# Patient Record
Sex: Female | Born: 1955
Health system: Southern US, Community
[De-identification: ages and names within clinical notes are randomized; demographics above are authoritative.]

## PROBLEM LIST (undated history)

## (undated) DIAGNOSIS — D649 Anemia, unspecified: Secondary | ICD-10-CM

## (undated) DIAGNOSIS — E876 Hypokalemia: Secondary | ICD-10-CM

## (undated) DIAGNOSIS — Z87891 Personal history of nicotine dependence: Secondary | ICD-10-CM

## (undated) DIAGNOSIS — I319 Disease of pericardium, unspecified: Secondary | ICD-10-CM

## (undated) DIAGNOSIS — G35D Multiple sclerosis, unspecified: Secondary | ICD-10-CM

## (undated) DIAGNOSIS — G35 Multiple sclerosis: Secondary | ICD-10-CM

## (undated) DIAGNOSIS — I4892 Unspecified atrial flutter: Secondary | ICD-10-CM

## (undated) DIAGNOSIS — E8770 Fluid overload, unspecified: Secondary | ICD-10-CM

## (undated) DIAGNOSIS — R569 Unspecified convulsions: Secondary | ICD-10-CM

## (undated) HISTORY — DX: Fluid overload, unspecified: E87.70

## (undated) HISTORY — DX: Anemia, unspecified: D64.9

## (undated) HISTORY — DX: Unspecified atrial flutter: I48.92

## (undated) HISTORY — DX: Disease of pericardium, unspecified: I31.9

## (undated) HISTORY — DX: Unspecified convulsions: R56.9

## (undated) HISTORY — PX: HYSTEROTOMY: SHX1776

## (undated) HISTORY — DX: Hypokalemia: E87.6

## (undated) HISTORY — DX: Multiple sclerosis, unspecified: G35.D

## (undated) HISTORY — PX: ABDOMINAL HYSTERECTOMY: SHX81

## (undated) HISTORY — DX: Personal history of nicotine dependence: Z87.891

## (undated) HISTORY — DX: Multiple sclerosis: G35

## (undated) HISTORY — PX: BREAST EXCISIONAL BIOPSY: SUR124

---

## 1997-06-05 ENCOUNTER — Emergency Department (HOSPITAL_COMMUNITY): Admission: EM | Admit: 1997-06-05 | Discharge: 1997-06-05 | Payer: Self-pay | Admitting: Emergency Medicine

## 1998-10-13 ENCOUNTER — Encounter: Payer: Self-pay | Admitting: Emergency Medicine

## 1998-10-13 ENCOUNTER — Emergency Department (HOSPITAL_COMMUNITY): Admission: EM | Admit: 1998-10-13 | Discharge: 1998-10-13 | Payer: Self-pay | Admitting: Emergency Medicine

## 1998-10-22 ENCOUNTER — Encounter: Admission: RE | Admit: 1998-10-22 | Discharge: 1999-01-20 | Payer: Self-pay

## 1999-02-01 ENCOUNTER — Encounter: Admission: RE | Admit: 1999-02-01 | Discharge: 1999-02-23 | Payer: Self-pay | Admitting: Internal Medicine

## 2000-05-25 ENCOUNTER — Ambulatory Visit (HOSPITAL_COMMUNITY): Admission: RE | Admit: 2000-05-25 | Discharge: 2000-05-25 | Payer: Self-pay | Admitting: Internal Medicine

## 2000-05-25 ENCOUNTER — Encounter: Payer: Self-pay | Admitting: Internal Medicine

## 2001-02-26 ENCOUNTER — Ambulatory Visit (HOSPITAL_COMMUNITY): Admission: RE | Admit: 2001-02-26 | Discharge: 2001-02-26 | Payer: Self-pay | Admitting: Internal Medicine

## 2001-02-26 ENCOUNTER — Encounter: Payer: Self-pay | Admitting: Internal Medicine

## 2001-02-27 ENCOUNTER — Encounter: Payer: Self-pay | Admitting: Cardiology

## 2001-03-11 ENCOUNTER — Ambulatory Visit (HOSPITAL_COMMUNITY): Admission: RE | Admit: 2001-03-11 | Discharge: 2001-03-11 | Payer: Self-pay | Admitting: Cardiology

## 2001-12-07 ENCOUNTER — Emergency Department (HOSPITAL_COMMUNITY): Admission: EM | Admit: 2001-12-07 | Discharge: 2001-12-07 | Payer: Self-pay | Admitting: Emergency Medicine

## 2001-12-07 ENCOUNTER — Encounter: Payer: Self-pay | Admitting: Emergency Medicine

## 2002-03-26 ENCOUNTER — Ambulatory Visit (HOSPITAL_COMMUNITY): Admission: RE | Admit: 2002-03-26 | Discharge: 2002-03-26 | Payer: Self-pay | Admitting: Neurology

## 2002-03-26 ENCOUNTER — Encounter: Payer: Self-pay | Admitting: Neurology

## 2002-10-29 ENCOUNTER — Encounter: Payer: Self-pay | Admitting: Internal Medicine

## 2002-10-29 ENCOUNTER — Ambulatory Visit (HOSPITAL_COMMUNITY): Admission: RE | Admit: 2002-10-29 | Discharge: 2002-10-29 | Payer: Self-pay | Admitting: Internal Medicine

## 2003-01-14 ENCOUNTER — Ambulatory Visit (HOSPITAL_COMMUNITY): Admission: RE | Admit: 2003-01-14 | Discharge: 2003-01-14 | Payer: Self-pay | Admitting: Internal Medicine

## 2003-11-01 ENCOUNTER — Emergency Department (HOSPITAL_COMMUNITY): Admission: EM | Admit: 2003-11-01 | Discharge: 2003-11-01 | Payer: Self-pay | Admitting: Family Medicine

## 2003-12-07 ENCOUNTER — Ambulatory Visit (HOSPITAL_COMMUNITY): Admission: RE | Admit: 2003-12-07 | Discharge: 2003-12-07 | Payer: Self-pay | Admitting: Internal Medicine

## 2004-03-01 ENCOUNTER — Encounter: Admission: RE | Admit: 2004-03-01 | Discharge: 2004-03-01 | Payer: Self-pay | Admitting: Internal Medicine

## 2004-03-01 ENCOUNTER — Encounter (INDEPENDENT_AMBULATORY_CARE_PROVIDER_SITE_OTHER): Payer: Self-pay | Admitting: *Deleted

## 2004-03-17 ENCOUNTER — Ambulatory Visit (HOSPITAL_COMMUNITY): Admission: RE | Admit: 2004-03-17 | Discharge: 2004-03-17 | Payer: Self-pay | Admitting: Internal Medicine

## 2004-12-29 ENCOUNTER — Ambulatory Visit (HOSPITAL_COMMUNITY): Admission: RE | Admit: 2004-12-29 | Discharge: 2004-12-29 | Payer: Self-pay | Admitting: *Deleted

## 2005-01-26 ENCOUNTER — Encounter (INDEPENDENT_AMBULATORY_CARE_PROVIDER_SITE_OTHER): Payer: Self-pay | Admitting: *Deleted

## 2005-01-26 ENCOUNTER — Observation Stay (HOSPITAL_COMMUNITY): Admission: AD | Admit: 2005-01-26 | Discharge: 2005-01-27 | Payer: Self-pay | Admitting: Pediatrics

## 2005-05-31 ENCOUNTER — Encounter: Admission: RE | Admit: 2005-05-31 | Discharge: 2005-05-31 | Payer: Self-pay | Admitting: Internal Medicine

## 2006-01-03 ENCOUNTER — Encounter: Admission: RE | Admit: 2006-01-03 | Discharge: 2006-01-03 | Payer: Self-pay | Admitting: Internal Medicine

## 2006-02-12 ENCOUNTER — Ambulatory Visit (HOSPITAL_COMMUNITY): Admission: RE | Admit: 2006-02-12 | Discharge: 2006-02-12 | Payer: Self-pay | Admitting: Surgery

## 2006-02-20 ENCOUNTER — Ambulatory Visit (HOSPITAL_COMMUNITY): Admission: RE | Admit: 2006-02-20 | Discharge: 2006-02-20 | Payer: Self-pay | Admitting: Surgery

## 2006-02-20 ENCOUNTER — Encounter (INDEPENDENT_AMBULATORY_CARE_PROVIDER_SITE_OTHER): Payer: Self-pay | Admitting: Specialist

## 2006-03-14 ENCOUNTER — Ambulatory Visit: Payer: Self-pay | Admitting: Infectious Diseases

## 2006-03-14 DIAGNOSIS — G35 Multiple sclerosis: Secondary | ICD-10-CM | POA: Insufficient documentation

## 2006-03-14 DIAGNOSIS — R599 Enlarged lymph nodes, unspecified: Secondary | ICD-10-CM | POA: Insufficient documentation

## 2006-03-14 DIAGNOSIS — J329 Chronic sinusitis, unspecified: Secondary | ICD-10-CM | POA: Insufficient documentation

## 2006-03-14 DIAGNOSIS — G40209 Localization-related (focal) (partial) symptomatic epilepsy and epileptic syndromes with complex partial seizures, not intractable, without status epilepticus: Secondary | ICD-10-CM | POA: Insufficient documentation

## 2006-03-14 DIAGNOSIS — Z9079 Acquired absence of other genital organ(s): Secondary | ICD-10-CM | POA: Insufficient documentation

## 2006-03-26 ENCOUNTER — Encounter: Payer: Self-pay | Admitting: Infectious Diseases

## 2006-03-26 ENCOUNTER — Ambulatory Visit: Payer: Self-pay | Admitting: Infectious Diseases

## 2006-03-26 LAB — CONVERTED CEMR LAB
ALT: 31 units/L (ref 0–35)
AST: 23 units/L (ref 0–37)
Albumin: 4.6 g/dL (ref 3.5–5.2)
Alkaline Phosphatase: 63 units/L (ref 39–117)
Amylase: 96 units/L (ref 0–105)
BUN: 15 mg/dL (ref 6–23)
Basophils Absolute: 0 10*3/uL (ref 0.0–0.1)
Basophils Relative: 0 % (ref 0–1)
CMV IgM: <0.9 (ref <0.90)
CO2: 29 meq/L (ref 19–32)
Calcium: 10 mg/dL (ref 8.4–10.5)
Chloride: 102 meq/L (ref 96–112)
Creatinine, Ser: 0.83 mg/dL (ref 0.40–1.20)
Crypto Ag: NEGATIVE
Cytomegalovirus Ab-IgG: POSITIVE — AB
EBV NA IgG: 4.62 — ABNORMAL HIGH
EBV VCA IgG: 5.21 — ABNORMAL HIGH
EBV VCA IgM: 0.25
Eosinophils Absolute: 0 10*3/uL (ref 0.0–0.7)
Eosinophils Relative: 1 % (ref 0–5)
Glucose, Bld: 79 mg/dL (ref 70–99)
HCT: 42.6 % (ref 36.0–46.0)
Hemoglobin: 13.5 g/dL (ref 12.0–15.0)
LDH: 150 units/L (ref 94–250)
Lipase: <10 units/L (ref 0–75)
Lymphocytes Relative: 31 % (ref 12–46)
Lymphs Abs: 1.4 10*3/uL (ref 0.7–3.3)
MCHC: 31.7 g/dL (ref 30.0–36.0)
MCV: 85.2 fL (ref 78.0–100.0)
Monocytes Absolute: 0.3 10*3/uL (ref 0.2–0.7)
Monocytes Relative: 6 % (ref 3–11)
Neutro Abs: 2.8 10*3/uL (ref 1.7–7.7)
Neutrophils Relative %: 62 % (ref 43–77)
Platelets: 168 10*3/uL (ref 150–400)
Potassium: 4.3 meq/L (ref 3.5–5.3)
RBC: 5 M/uL (ref 3.87–5.11)
RDW: 14.2 % — ABNORMAL HIGH (ref 11.5–14.0)
Sodium: 140 meq/L (ref 135–145)
Total Bilirubin: 0.4 mg/dL (ref 0.3–1.2)
Total Protein: 7.5 g/dL (ref 6.0–8.3)
Toxoplasma Antibody- IgM: 0.37
Toxoplasma IgG Ratio: <0.5
WBC: 4.5 10*3/uL (ref 4.0–10.5)

## 2006-03-28 ENCOUNTER — Ambulatory Visit (HOSPITAL_COMMUNITY): Admission: RE | Admit: 2006-03-28 | Discharge: 2006-03-28 | Payer: Self-pay | Admitting: Infectious Diseases

## 2006-04-27 ENCOUNTER — Encounter: Admission: RE | Admit: 2006-04-27 | Discharge: 2006-04-27 | Payer: Self-pay | Admitting: Internal Medicine

## 2006-06-13 ENCOUNTER — Encounter: Admission: RE | Admit: 2006-06-13 | Discharge: 2006-06-13 | Payer: Self-pay | Admitting: Internal Medicine

## 2006-06-18 ENCOUNTER — Telehealth: Payer: Self-pay | Admitting: Infectious Diseases

## 2007-11-26 ENCOUNTER — Encounter: Admission: RE | Admit: 2007-11-26 | Discharge: 2007-11-26 | Payer: Self-pay | Admitting: Internal Medicine

## 2009-02-04 ENCOUNTER — Encounter: Admission: RE | Admit: 2009-02-04 | Discharge: 2009-02-04 | Payer: Self-pay | Admitting: Internal Medicine

## 2010-02-19 ENCOUNTER — Encounter: Payer: Self-pay | Admitting: Internal Medicine

## 2010-02-20 ENCOUNTER — Encounter: Payer: Self-pay | Admitting: Internal Medicine

## 2010-03-01 NOTE — Letter (Signed)
Summary: Initial OV  Initial OV   Imported By: Dorice Lamas 05/18/2006 11:47:51  _____________________________________________________________________  External Attachment:    Type:   Image     Comment:   External Document

## 2010-03-01 NOTE — Progress Notes (Signed)
Summary: Zithromax refill 06-18-06/mld  Phone Note Refill Request Message from:  Fax from Pharmacy  Refills Requested: Medication #1:  ZITHROMAX 600 MG TABS Take 1 tablet by mouth once a day.   Dosage confirmed as above?Dosage Confirmed   Last Refilled: 06/10/2006 Patient missed appt. in March 2008.  Approved antibiotice one time with no additional refills and a note to pharmacy that patient needs an office visit.  Initial call taken by: Paulo Fruit,  Jun 18, 2006 9:41 AM    Prescriptions: ZITHROMAX 600 MG TABS (AZITHROMYCIN) Take 1 tablet by mouth once a day  #30 x 0   Entered by:   Paulo Fruit   Authorized by:   Johny Sax MD   Signed by:   Paulo Fruit on 06/18/2006   Method used:   Telephoned to ...       [facility] [address] [facility]       [address]       [place], [place]  [address]  [place]       Ph: [phone]       Fax: [phone]   RxID:   [id number]  ...................................................................Paulo Fruit  Jun 18, 2006 9:42 AM

## 2010-03-01 NOTE — Assessment & Plan Note (Signed)
Summary: Office Visit (Infectious Disease)    Vital Signs:  Patient Profile:   55 Years Old Female Weight:      106.3 pounds (48.32 kg) Temp:     97.8 degrees F oral Pulse rate:   78 / minute BP supine:   124 / 78  (right arm)  Pt. in pain?   yes    Location:   abdomen    Intensity:   6    Type:       sharp  Vitals Entered By: Tomasita Morrow RN (March 26, 2006 11:32 AM)              Is Patient Diabetic? No Nutritional Status Normal  Have you ever been in a relationship where you felt threatened, hurt or afraid?No   Does patient need assistance? Functional Status Self care Ambulation Normal               Financial    Marital Info: Married                                  DATE  POS  [place]  [place]?  Syphilis:  Gonorrhea:     Chlamydia:  Genital Herpes:        Smoker: never DATE  POS  Hep A:   Vaccines / Physicals  Pregnancy Information        Opportunistic Infections    YEAR   PPD Status        Treatment Information                 Last Education Dates           Current HIV Regimen  Referrals   Chief Complaint:  f/u ov.  History of Present Illness: 55 yo F with MS who was seen previously for Lymphadenopathy. Currently complains of LLQ pain. She describes it as a catch. Has tried OTCs without improvement.  Has had no difficulty taking the azithro that she was stated on at her previous visit (03-14-06). She has continued LAN in her axilla. Has had no fevers or chills.  States she has a recent Mammogram which was negative. (01-03-06 multiple enlarged lymphnodes) Her medications are unchanged.   Prior Medications: BETASERON 0.3 MG SOLR (INTERFERON BETA-1B)  DILANTIN 100 MG CAPS (PHENYTOIN SODIUM EXTENDED) Take 2  tablets by mouth at bedtime ZITHROMAX 600 MG TABS (AZITHROMYCIN) Take 1 tablet by mouth once a day Current Allergies: ! * COPAXONE ! TEGRETOL     Physical Exam  General:     alert and underweight appearing.    Eyes:     pupils equal, pupils round, and pupils reactive to light.   Mouth:     good dentition and pharynx pink and moist.   Neck:     no masses.   Lungs:     normal respiratory effort and normal breath sounds.   Heart:     normal rate, regular rhythm, and no murmur.   Abdomen:     soft, normal bowel sounds, no distention, no masses, no guarding, no rigidity, no rebound tenderness, and LUQ tenderness (mild).  Additional Exam:     palpable lymph node in R axilla.      Impression & Recommendations:  Problem # 1:  LYMPHADENOPATHY (ICD-785.6)  Her updated medication list for this problem includes:    Zithromax 600 Mg Tabs (Azithromycin) .Marland Kitchen... Take 1 tablet by mouth once a day  will continue her azithro check the following blood tests- crypto ag, histo ag, EBV panel, CMV IgG/M, HIV, RPR, BCx, BCx AFB,  will also check CT abdomen with contrast to further evaluate the pain she has. depending on the result will see if there are nodes which are amenable to bx.

## 2010-06-17 NOTE — Op Note (Signed)
NAME:  Sherri Ramirez, Sherri Ramirez           ACCOUNT NO.:  [id number]   MEDICAL RECORD NO.:  [id number]          PATIENT TYPE:  INP   LOCATION:  9320                          FACILITY:  [facility]   PHYSICIAN:   B. Earlene Plater, M.D.  DATE OF BIRTH:  1956-01-16   DATE OF PROCEDURE:  01/26/2005  DATE OF DISCHARGE:                                 OPERATIVE REPORT   PREOPERATIVE DIAGNOSIS:  Abnormal and fibroids.   POSTOPERATIVE DIAGNOSIS:  Abnormal and fibroids.   OPERATION/PROCEDURE:  1.  Laparoscopic-assisted vaginal hysterectomy.  2.  Bilateral salpingo-oophorectomy.   SURGEON:  Chester Holstein. Earlene Plater, M.D.   ASSISTANT:  Marlinda Mike, C.N.M.  Lenoard Aden, M.D.   ANESTHESIA:  General.   SPECIMENS:  Uterus, tubes and ovaries and cervix.   ESTIMATED BLOOD LOSS:  300 mL.   COMPLICATIONS:  None.   INDICATIONS:  The patient has history of heavy menstrual periods, not  responding to medical management.  Preoperative endometrial biopsy was  benign.  Ultrasound consistent with intramural fibroids.  The patient was  advised of the risks surgically, including infection, bleeding, damage to  surrounding organs, and potential need to convert to abdominal hysterectomy.   DESCRIPTION OF PROCEDURE:  The patient was taken to the operating room and  general anesthesia obtained.  She was placed in the [place] stirrups and  prepped and draped in the standard fashion.  Foley catheter was inserted to  the bladder.  Sponge stick placed in the vagina.  '   A 10 mm incision was placed in the umbilicus, carried sharply to the fascia.  The fascia was divided and elevated.  Posterior sheath and peritoneum were  entered sharply.  Pursestring suture of 0 Vicryl was placed around the  fascial defect.  Hasson cannula inserted and secured.  Pneumoperitoneum with  CO2 gas.  An 11 mm port was placed on the left lower quadrant for  laparoscopic visualization and 5 mm ports were placed in the midline  suprapubically and  right lower quadrant each under direct laparoscopic  visualization.   The uterus was enlarged, about 14 weeks size with multiple fibroids.  The  course of each ureter was identified and found to be well away from the area  of interest.  The left infundibulopelvic ligament was not well-visualized  initially due to the bulk of the fibroid.  Therefore, the left round  ligament was first approached, sealed and divided with the Harmonic scalpel  and utero-ovarian ligament and tube divided with the Harmonic as well.  Bladder flap created with the Harmonic.  On the right side the  infundibulopelvic ligament was more accessible.  Therefore, it was then  divided with the Harmonic and the tube and ovary removed with the specimen.  Uterine artery was skeletonized on each side, sealed and divided with  Harmonic.  Uterus was very bulky and I did not think it would work well to  [employer] vaginally.  Therefore, I chose to proceed with the [facility]  laparoscopically.  This was done in the standard fashion after the cervix  was truncated with the Harmonic. All the debris was removed and then the  left tube and ovary were removed after sealing and dividing the  infundibulopelvic ligament.  Hemostasis was obtained.   Colpotomy made anteriorly and posteriorly sharply.  Curved Deaver placed in  the anterior cul-de-sac. Uterosacral ligaments were clamped with curved  Heaney clamps, divided and suture ligated with 0 Vicryl with hemostasis  obtained.  A second bite on each side was necessary for the lower portion of  the cardinal ligaments.  These were clamped, cut and suture ligated with 0  Vicryl.  Hemostasis was obtained.   The posterior culdoplasty suture placed of 0 Vicryl entering the posterior  cul-de-sac, taking a bite of the left uterosacral reefing along the  posterior peritoneum, taking a bite of the right uterosacral ligament and  exiting posteriorly.  The remainder of the vaginal cuff was  closed with  figure-of-eight suture of 0 Vicryl.   Attention was returned to the abdomen.  After gloves were changed,  pneumoperitoneum was reobtained.  All pedicles were inspected and were  hemostatic.  There was some slight oozing from the dome of the bladder, made  hemostatic with light bipolar cautery.  The pneumoperitoneum was taken down  to a pressure of about 4 mm. No bleeding noted.  Therefore, procedure  terminated.   The ports were removed and sites inspected laparoscopically and they were  hemostatic.  Scope was removed and gas released.  Hasson cannula removed.  Abdominal wall elevated.  Pursestring suture tied down.  The fascial defect  was obliterated.  No contents herniated prior to closure.  Left lower  quadrant port was also closed with a single stitch of 0 Vicryl with careful  attention to the underlying structures.  Skin was closed at the umbilicus  with subcuticular 4-0 Vicryl.  Skin was closed at the lower ports with  Dermabond.   The patient tolerated the procedure well.  There were no complications.  She  was taken to the recovery room awake, alert, in stable condition. All counts  were correct per the operating room staff.      Gerri Spore B. Earlene Plater, M.D.  Electronically Signed     WBD/MEDQ  D:  01/26/2005  T:  01/27/2005  Job:  [id number]

## 2011-11-24 ENCOUNTER — Other Ambulatory Visit: Payer: Self-pay | Admitting: Internal Medicine

## 2011-11-24 DIAGNOSIS — Z1231 Encounter for screening mammogram for malignant neoplasm of breast: Secondary | ICD-10-CM

## 2012-01-03 ENCOUNTER — Ambulatory Visit
Admission: RE | Admit: 2012-01-03 | Discharge: 2012-01-03 | Disposition: A | Discharge status: Home / Self Care | Payer: BC Managed Care – PPO | Source: Ambulatory Visit | Attending: Internal Medicine | Admitting: Internal Medicine

## 2012-01-03 DIAGNOSIS — Z1231 Encounter for screening mammogram for malignant neoplasm of breast: Secondary | ICD-10-CM

## 2012-06-19 ENCOUNTER — Telehealth: Payer: Self-pay | Admitting: *Deleted

## 2012-06-19 NOTE — Telephone Encounter (Signed)
Patient calling wanting to know if her Handicap form is done. Its been a week and have not heard anything.

## 2012-06-19 NOTE — Telephone Encounter (Signed)
Sherri Ramirez said that she called patient and left message that form was ready for pick up front desk today...06/19/12.Marland KitchenMarland KitchenMarland Kitchenas

## 2012-06-27 ENCOUNTER — Telehealth: Payer: Self-pay | Admitting: *Deleted

## 2012-06-27 NOTE — Telephone Encounter (Signed)
Patient had spoke to the Rebif nurse and was told that she needs to have labs drawn every 3 months. The patient wants to know if she is to make a follow-up appointment or just ave an order put in for lab work. Please advise.

## 2012-06-28 NOTE — Telephone Encounter (Signed)
I talked to Sherri Ramirez who says that she tells patients  it should be done in about 6 months so looks like its about time in the next few weeks,  then can be done yearly with f/u visit.

## 2012-07-17 ENCOUNTER — Other Ambulatory Visit: Payer: Self-pay | Admitting: Neurology

## 2012-07-17 ENCOUNTER — Telehealth: Payer: Self-pay

## 2012-07-17 DIAGNOSIS — G35 Multiple sclerosis: Secondary | ICD-10-CM

## 2012-07-17 NOTE — Telephone Encounter (Signed)
I reviewed recommendations for follow up blood work with Sherri Redhead, NP. I advised patient that an order has been placed for her to come get her blood work done whenever she is available. Patient appreciated the call and wished that I let [name] know she is appreciated.

## 2012-07-17 NOTE — Telephone Encounter (Signed)
Message copied by [facility] on Wed Jul 17, 2012  3:39 PM ------      Message from: Anice Paganini      Created: Tue Jul 16, 2012 11:28 AM      Contact: Pt Sherri Ramirez       Pt called states she is suppose to have lab work done more since her meds' have been changed and she states no one is returning her call about this matter and she doesn't want to wait till Aug. She wants someone to call her back.  ------

## 2012-07-19 ENCOUNTER — Other Ambulatory Visit: Payer: Self-pay | Admitting: Nurse Practitioner

## 2012-07-20 LAB — COMPREHENSIVE METABOLIC PANEL
ALT: 14 IU/L (ref 0–32)
AST: 16 IU/L (ref 0–40)
Albumin/Globulin Ratio: 1.7 (ref 1.1–2.5)
Albumin: 4.5 g/dL (ref 3.5–5.5)
Alkaline Phosphatase: 48 IU/L (ref 39–117)
BUN/Creatinine Ratio: 15 (ref 9–23)
BUN: 12 mg/dL (ref 6–24)
CO2: 26 mmol/L (ref 18–29)
Calcium: 9.3 mg/dL (ref 8.7–10.2)
Chloride: 101 mmol/L (ref 97–108)
Creatinine, Ser: 0.82 mg/dL (ref 0.57–1.00)
GFR calc Af Amer: 93 mL/min/{1.73_m2} (ref >59)
GFR calc non Af Amer: 81 mL/min/{1.73_m2} (ref >59)
Globulin, Total: 2.7 g/dL (ref 1.5–4.5)
Glucose: 93 mg/dL (ref 65–99)
Potassium: 3.9 mmol/L (ref 3.5–5.2)
Sodium: 141 mmol/L (ref 134–144)
Total Bilirubin: 0.3 mg/dL (ref 0.0–1.2)
Total Protein: 7.2 g/dL (ref 6.0–8.5)

## 2012-07-22 ENCOUNTER — Telehealth: Payer: Self-pay

## 2012-07-22 NOTE — Telephone Encounter (Signed)
Message copied by [facility] on Mon Jul 22, 2012  1:32 PM ------      Message from: Beverely Low      Created: Mon Jul 22, 2012  8:50 AM       Labs look good. Please call patient            ----- Message -----         From: [facility] Lab Results In Interface         Sent: 07/20/2012   5:47 AM           To: Nilda Riggs, NP                   ------

## 2012-07-22 NOTE — Telephone Encounter (Signed)
I called patient and advised her of laboratory results, per [name] NP

## 2012-08-05 ENCOUNTER — Other Ambulatory Visit: Payer: Self-pay | Admitting: Neurology

## 2012-08-08 ENCOUNTER — Other Ambulatory Visit: Payer: Self-pay | Admitting: Neurology

## 2012-09-06 ENCOUNTER — Encounter: Payer: Self-pay | Admitting: Nurse Practitioner

## 2012-09-06 ENCOUNTER — Ambulatory Visit (INDEPENDENT_AMBULATORY_CARE_PROVIDER_SITE_OTHER): Payer: BC Managed Care – PPO | Admitting: Nurse Practitioner

## 2012-09-06 VITALS — BP 128/80 | HR 83 | Ht 64.0 in | Wt 111.0 lb

## 2012-09-06 DIAGNOSIS — Z8669 Personal history of other diseases of the nervous system and sense organs: Secondary | ICD-10-CM

## 2012-09-06 DIAGNOSIS — G35 Multiple sclerosis: Secondary | ICD-10-CM

## 2012-09-06 DIAGNOSIS — G40209 Localization-related (focal) (partial) symptomatic epilepsy and epileptic syndromes with complex partial seizures, not intractable, without status epilepticus: Secondary | ICD-10-CM

## 2012-09-06 MED ORDER — PHENYTOIN SODIUM EXTENDED 100 MG PO CAPS: 200.0000 mg | ORAL_CAPSULE | Freq: Every evening | ORAL | Status: DC

## 2012-09-06 MED ORDER — PHENYTOIN 50 MG PO CHEW: 50.0000 mg | CHEWABLE_TABLET | Freq: Every evening | ORAL | Status: DC

## 2012-09-06 MED ORDER — INTERFERON BETA-1A 44 MCG/0.5ML ~~LOC~~ SOLN: 44.0000 ug | SUBCUTANEOUS | Status: DC

## 2012-09-06 NOTE — Progress Notes (Signed)
  Reason for visit followup for her seizure disorder and multiple sclerosis  HPI: Sherri Ramirez 57 year old female returns for followup. She was last seen in this office 03/21/2011. She has a history of multiple sclerosis and is currently on Rebif  without side effects or site injection problems She has had a history of seizure disorder, well controlled on Dilantin last seizure 1999. Multiple sclerosis was diagnosed in March 2004. She was initiated on Copaxone and then had an extreme allergic reaction and was initiated on Betaseron in April of 2007. Insurance will no longer cover Betaseron and she was initiated on Rebif in February of this year.  TODAY: Denies double vision, no falls, no sensory changes, no spasticity.Denies loss of bowel or bladder control,  no speech difficulties. She has been on disability from her job since January 2009. No seizure activity.  ROS:  Negative   Medications Current Outpatient Prescriptions on File Prior to Visit  Medication Sig Dispense Refill  . alendronate (FOSAMAX) 70 MG tablet Take 70 mg by mouth every 7 (seven) days. Take with a full glass of water on an empty stomach.      Marland Kitchen FA-Cyanocobalamin-B6-D-Ca (D 1000 PLUS) TABS Take by mouth daily.      . interferon beta-1a (REBIF REBIDOSE) 44 MCG/0.5ML injection Inject 44 mcg into the skin 3 (three) times a week.      . Multiple Vitamins-Minerals (MULTIVITAMIN PO) Take by mouth daily.      . phenytoin (DILANTIN) 100 MG ER capsule Take 2 capsules (200 mg total) by mouth every evening.  180 capsule  0  . phenytoin (DILANTIN) 50 MG tablet Chew 1 tablet (50 mg total) by mouth every evening.  90 tablet  0   No current facility-administered medications on file prior to visit.    Allergies  Allergies  Allergen Reactions  . Carbamazepine   . Glatiramer Acetate     REACTION: anaphylaxis    Physical Exam General: well developed, well nourished, seated, in no evident distress Head: head normocephalic and atraumatic.  Oropharynx benign Neck: supple with no carotid  bruits Cardiovascular: regular rate and rhythm, no murmurs  Neurologic Exam Mental Status: Awake and fully alert. Oriented to place and time. Follows all commands. Speech and language normal.   Cranial Nerves:  Pupils equal, briskly reactive to light. Extraocular movements full without nystagmus. Visual fields full to confrontation. Hearing intact and symmetric to finger snap. Facial sensation intact. Face, tongue, palate move normally and symmetrically. Neck flexion and extension normal.  Motor: Normal bulk and tone. Normal strength in all tested extremity muscles except 4/5 weakness in left lower extremity Sensory.: intact to touch and pinprick and vibratory.  Coordination: Rapid alternating movements normal in all extremities. Finger-to-nose and heel-to-shin performed accurately bilaterally. Gait and Station: Arises from chair without difficulty. Stance is normal. Gait demonstrates normal stride length and balance . Able to heel, toe and tandem walk without difficulty. No assistive device Reflexes: 1+ and symmetric. Toes downgoing.     ASSESSMENT: Multiple sclerosis relapsing remitting in good control on Rebif Complex partial seizure disorder in good control on Dilantin     PLAN: Will  continue Dilantin will renew Will renew Rebif Will obtain CBC, CMP and Dilantin level today F/U yearly   Nilda Riggs, GNP-BC APRN

## 2012-09-06 NOTE — Patient Instructions (Addendum)
Pt to continue Dilantin will renew Will renew Rebif Will obtain CBC, CMP and Dilantin level today F/U yearly

## 2012-09-06 NOTE — Progress Notes (Signed)
I have read the note, and I agree with the clinical assessment and plan.  

## 2012-09-09 NOTE — Telephone Encounter (Signed)
Done

## 2012-09-10 LAB — COMPREHENSIVE METABOLIC PANEL
ALT: 12 IU/L (ref 0–32)
AST: 16 IU/L (ref 0–40)
Albumin/Globulin Ratio: 1.8 (ref 1.1–2.5)
Albumin: 4.5 g/dL (ref 3.5–5.5)
Alkaline Phosphatase: 56 IU/L (ref 39–117)
BUN/Creatinine Ratio: 22 (ref 9–23)
BUN: 17 mg/dL (ref 6–24)
CO2: 22 mmol/L (ref 18–29)
Calcium: 9.5 mg/dL (ref 8.7–10.2)
Chloride: 102 mmol/L (ref 97–108)
Creatinine, Ser: 0.79 mg/dL (ref 0.57–1.00)
GFR calc Af Amer: 97 mL/min/{1.73_m2} (ref >59)
GFR calc non Af Amer: 85 mL/min/{1.73_m2} (ref >59)
Globulin, Total: 2.5 g/dL (ref 1.5–4.5)
Glucose: 79 mg/dL (ref 65–99)
Potassium: 4.1 mmol/L (ref 3.5–5.2)
Sodium: 141 mmol/L (ref 134–144)
Total Bilirubin: 0.2 mg/dL (ref 0.0–1.2)
Total Protein: 7 g/dL (ref 6.0–8.5)

## 2012-09-10 LAB — CBC WITH DIFFERENTIAL/PLATELET
Basophils Absolute: 0 10*3/uL (ref 0.0–0.2)
Basos: 0 % (ref 0–3)
Eos: 0 % (ref 0–5)
Eosinophils Absolute: 0 10*3/uL (ref 0.0–0.4)
HCT: 39.7 % (ref 34.0–46.6)
Hemoglobin: 13.3 g/dL (ref 11.1–15.9)
Immature Grans (Abs): 0 10*3/uL (ref 0.0–0.1)
Immature Granulocytes: 0 % (ref 0–2)
Lymphocytes Absolute: 2.3 10*3/uL (ref 0.7–3.1)
Lymphs: 43 % (ref 14–46)
MCH: 27.1 pg (ref 26.6–33.0)
MCHC: 33.5 g/dL (ref 31.5–35.7)
MCV: 81 fL (ref 79–97)
Monocytes Absolute: 0.4 10*3/uL (ref 0.1–0.9)
Monocytes: 8 % (ref 4–12)
Neutrophils Absolute: 2.6 10*3/uL (ref 1.4–7.0)
Neutrophils Relative %: 49 % (ref 40–74)
RBC: 4.91 x10E6/uL (ref 3.77–5.28)
RDW: 14.5 % (ref 12.3–15.4)
WBC: 5.3 10*3/uL (ref 3.4–10.8)

## 2012-09-10 LAB — PHENYTOIN LEVEL, TOTAL: Phenytoin Lvl: 12.5 ug/mL (ref 10.0–20.0)

## 2012-09-10 NOTE — Progress Notes (Signed)
Quick Note:  Spoke to patient and relayed normal lab results, per [place]. ______

## 2012-09-18 ENCOUNTER — Ambulatory Visit: Payer: Self-pay | Admitting: Nurse Practitioner

## 2012-12-13 ENCOUNTER — Other Ambulatory Visit: Payer: Self-pay

## 2012-12-13 DIAGNOSIS — Z1231 Encounter for screening mammogram for malignant neoplasm of breast: Secondary | ICD-10-CM

## 2013-01-16 ENCOUNTER — Ambulatory Visit
Admission: RE | Admit: 2013-01-16 | Discharge: 2013-01-16 | Disposition: A | Discharge status: Home / Self Care | Payer: BC Managed Care – PPO | Source: Ambulatory Visit

## 2013-01-16 DIAGNOSIS — Z1231 Encounter for screening mammogram for malignant neoplasm of breast: Secondary | ICD-10-CM

## 2013-02-05 ENCOUNTER — Emergency Department (HOSPITAL_COMMUNITY): Payer: BC Managed Care – PPO

## 2013-02-05 ENCOUNTER — Encounter (HOSPITAL_COMMUNITY): Payer: Self-pay | Admitting: Emergency Medicine

## 2013-02-05 ENCOUNTER — Emergency Department (HOSPITAL_COMMUNITY)
Admission: EM | Admit: 2013-02-05 | Discharge: 2013-02-05 | Disposition: A | Discharge status: Home / Self Care | Payer: BC Managed Care – PPO | Attending: Emergency Medicine | Admitting: Emergency Medicine

## 2013-02-05 DIAGNOSIS — Y939 Activity, unspecified: Secondary | ICD-10-CM | POA: Insufficient documentation

## 2013-02-05 DIAGNOSIS — M25571 Pain in right ankle and joints of right foot: Secondary | ICD-10-CM

## 2013-02-05 DIAGNOSIS — X500XXA Overexertion from strenuous movement or load, initial encounter: Secondary | ICD-10-CM | POA: Insufficient documentation

## 2013-02-05 DIAGNOSIS — S99919A Unspecified injury of unspecified ankle, initial encounter: Principal | ICD-10-CM

## 2013-02-05 DIAGNOSIS — S8990XA Unspecified injury of unspecified lower leg, initial encounter: Secondary | ICD-10-CM | POA: Insufficient documentation

## 2013-02-05 DIAGNOSIS — Z79899 Other long term (current) drug therapy: Secondary | ICD-10-CM | POA: Insufficient documentation

## 2013-02-05 DIAGNOSIS — S99929A Unspecified injury of unspecified foot, initial encounter: Principal | ICD-10-CM

## 2013-02-05 DIAGNOSIS — Z87891 Personal history of nicotine dependence: Secondary | ICD-10-CM | POA: Insufficient documentation

## 2013-02-05 DIAGNOSIS — G40909 Epilepsy, unspecified, not intractable, without status epilepticus: Secondary | ICD-10-CM | POA: Insufficient documentation

## 2013-02-05 DIAGNOSIS — R296 Repeated falls: Secondary | ICD-10-CM | POA: Insufficient documentation

## 2013-02-05 DIAGNOSIS — Y929 Unspecified place or not applicable: Secondary | ICD-10-CM | POA: Insufficient documentation

## 2013-02-05 MED ORDER — HYDROCODONE-ACETAMINOPHEN 5-325 MG PO TABS: 1.0000 | ORAL_TABLET | Freq: Four times a day (QID) | ORAL | Status: DC | PRN

## 2013-02-05 NOTE — ED Provider Notes (Signed)
CSN: [id number]     Arrival date & time 02/05/13  1216 History   First MD Initiated Contact with Patient 02/05/13 1308     Chief Complaint  Patient presents with  . Ankle Pain   (Consider location/radiation/quality/duration/timing/severity/associated sxs/prior Treatment) HPI Comments: Patient presents to the emergency department with chief complaint of right ankle pain. She states that she slipped on a plastic bag on Monday night. She states that she rolled her ankle. She's had pain while walking, and with movement of the ankle since the incident. She has not taken anything to alleviate her symptoms. Nothing makes his symptoms better or worse. She is able to ambulate, but has to limp.    The history is provided by the patient. No language interpreter was used.    Past Medical History  Diagnosis Date  . Seizure   . MS (multiple sclerosis)    Past Surgical History  Procedure Laterality Date  . Hysterotomy    . Abdominal hysterectomy     Family History  Problem Relation Age of Onset  . Heart attack Father   . Colon cancer Mother    History  Substance Use Topics  . Smoking status: Former Games developer  . Smokeless tobacco: Never Used  . Alcohol Use: No   OB History   Grav Para Term Preterm Abortions TAB SAB Ect Mult Living                 Review of Systems  All other systems reviewed and are negative.    Allergies  Carbamazepine and Glatiramer acetate  Home Medications   Current Outpatient Rx  Name  Route  Sig  Dispense  Refill  . alendronate (FOSAMAX) 70 MG tablet   Oral   Take 70 mg by mouth every 7 (seven) days. Take with a full glass of water on an empty stomach.         Marland Kitchen FA-Cyanocobalamin-B6-D-Ca (D 1000 PLUS) TABS   Oral   Take by mouth daily.         Marland Kitchen HYDROcodone-acetaminophen (NORCO/VICODIN) 5-325 MG per tablet   Oral   Take 1-2 tablets by mouth every 6 (six) hours as needed.   13 tablet   0   . interferon beta-1a (REBIF REBIDOSE) 44 MCG/0.5ML  injection   Subcutaneous   Inject 0.5 mLs (44 mcg total) into the skin 3 (three) times a week.   18 mL   3   . Multiple Vitamins-Minerals (MULTIVITAMIN PO)   Oral   Take by mouth daily.         . phenytoin (DILANTIN) 100 MG ER capsule   Oral   Take 2 capsules (200 mg total) by mouth every evening.   180 capsule   3   . phenytoin (DILANTIN) 50 MG tablet   Oral   Chew 1 tablet (50 mg total) by mouth every evening.   90 tablet   3    BP 121/81  Pulse 92  Temp(Src) 98.2 F (36.8 C) (Oral)  Resp 16  Wt 103 lb 14.4 oz (47.129 kg)  SpO2 100% Physical Exam  Nursing note and vitals reviewed. Constitutional: She is oriented to person, place, and time. She appears well-developed and well-nourished.  HENT:  Head: Normocephalic and atraumatic.  Eyes: Conjunctivae and EOM are normal.  Neck: Normal range of motion.  Cardiovascular: Normal rate.   Pulmonary/Chest: Effort normal.  Abdominal: She exhibits no distension.  Musculoskeletal: Normal range of motion.  Right ankle tender to palpation over the  anterior talofibular and calcaneal fibular ligament, and no bony abnormality or deformity, range of motion and strength is reduced secondary to pain, special testing is deferred secondary to guarding and pain.  Neurological: She is alert and oriented to person, place, and time.  Skin: Skin is dry.  Psychiatric: She has a normal mood and affect. Her behavior is normal. Judgment and thought content normal.    ED Course  Procedures (including critical care time) Labs Review Labs Reviewed - No data to display Imaging Review Dg Ankle Complete Right  02/05/2013   CLINICAL DATA:  Lateral ankle pain  EXAM: RIGHT ANKLE - COMPLETE 3+ VIEW  COMPARISON:  None  FINDINGS: Osseous demineralization.  Ankle mortise intact.  No acute fracture, dislocation or bone destruction.  IMPRESSION: No acute osseous abnormalities.   Electronically Signed   By: Ulyses SouthwardMark  Boles M.D.   On: 02/05/2013 13:27    EKG  Interpretation   None       MDM   1. Ankle pain, right    Patient with right ankle pain. She likely sustained sprain. Offered crutches and an ankle brace, but she requested to have a boot because she doesn't feel that she could use crutches.  I will give her a boot, and have her follow-up with orthopedics.  She is stable and ready for discharge.    Roxy Horsemanobert Red Mandt, PA-C 02/05/13 1441

## 2013-02-05 NOTE — ED Notes (Signed)
Pt states Monday evening hurt right ankle by slipping on plastic bag

## 2013-02-05 NOTE — ED Notes (Signed)
Ortho tech to apply cam walker.

## 2013-02-05 NOTE — Discharge Instructions (Signed)

## 2013-02-06 NOTE — ED Provider Notes (Signed)
Medical screening examination/treatment/procedure(s) were performed by non-physician practitioner and as supervising physician I was immediately available for consultation/collaboration.  EKG Interpretation   None         Candyce ChurnJohn David Jonh Mcqueary, MD 02/06/13 [phone]

## 2013-09-05 ENCOUNTER — Ambulatory Visit (INDEPENDENT_AMBULATORY_CARE_PROVIDER_SITE_OTHER): Payer: BC Managed Care – PPO | Admitting: Nurse Practitioner

## 2013-09-05 ENCOUNTER — Encounter: Payer: Self-pay | Admitting: Nurse Practitioner

## 2013-09-05 VITALS — BP 128/81 | HR 67 | Ht 64.0 in | Wt 104.0 lb

## 2013-09-05 DIAGNOSIS — Z5181 Encounter for therapeutic drug level monitoring: Secondary | ICD-10-CM

## 2013-09-05 DIAGNOSIS — Z8669 Personal history of other diseases of the nervous system and sense organs: Secondary | ICD-10-CM

## 2013-09-05 DIAGNOSIS — G35 Multiple sclerosis: Secondary | ICD-10-CM

## 2013-09-05 MED ORDER — INTERFERON BETA-1A 44 MCG/0.5ML ~~LOC~~ SOLN: 44.0000 ug | SUBCUTANEOUS | Status: DC

## 2013-09-05 MED ORDER — PHENYTOIN SODIUM EXTENDED 100 MG PO CAPS: 200.0000 mg | ORAL_CAPSULE | Freq: Every evening | ORAL | Status: DC

## 2013-09-05 MED ORDER — PHENYTOIN 50 MG PO CHEW: 50.0000 mg | CHEWABLE_TABLET | Freq: Every evening | ORAL | Status: DC

## 2013-09-05 NOTE — Progress Notes (Signed)
[facility]  PATIENT: Sherri Ramirez DOB: Jun 23, 1955   REASON FOR VISIT: follow up for seizure disorder and MS   HISTORY OF PRESENT ILLNESS:Sherri Ramirez 58 year old female returns for followup. She was last seen in this office 09/06/12.  She has a history of multiple sclerosis and is currently on Rebif without side effects or site injection problems She has had a history of seizure disorder, well controlled on Dilantin last seizure 1999. Multiple sclerosis was diagnosed in March 2004. She was initiated on Copaxone and then had an extreme allergic reaction and was initiated on Betaseron in April of 2007. Insurance will no longer cover Betaseron and she was initiated on Rebif in February of this year. She denies injection site problems. Denies double vision, no falls, no sensory changes, no spasticity.Denies loss of bowel or bladder control, no speech difficulties. She has been on disability from her job since January 2009. No seizure activity.She returns for reevaluation.  REVIEW OF SYSTEMS: Full 14 system review of systems performed and notable only for those listed, all others are neg:  Constitutional: N/A  Cardiovascular: N/A  Ear/Nose/Throat: N/A  Skin: N/A  Eyes: N/A  Respiratory: N/A  Gastroitestinal: N/A  Hematology/Lymphatic: N/A  Endocrine: N/A Musculoskeletal:N/A  Allergy/Immunology: N/A  Neurological: N/A Psychiatric: N/A Sleep : NA   ALLERGIES: Allergies  Allergen Reactions  . Carbamazepine   . Glatiramer Acetate     REACTION: anaphylaxis    HOME MEDICATIONS: Outpatient Prescriptions Prior to Visit  Medication Sig Dispense Refill  . alendronate (FOSAMAX) 70 MG tablet Take 70 mg by mouth every 7 (seven) days. Take with a full glass of water on an empty stomach.      Marland Kitchen FA-Cyanocobalamin-B6-D-Ca (D 1000 PLUS) TABS Take by mouth daily.      . interferon beta-1a (REBIF REBIDOSE) 44 MCG/0.5ML injection Inject 0.5 mLs (44 mcg total) into the skin 3 (three)  times a week.  18 mL  3  . Multiple Vitamins-Minerals (MULTIVITAMIN PO) Take by mouth daily.      . phenytoin (DILANTIN) 100 MG ER capsule Take 2 capsules (200 mg total) by mouth every evening.  180 capsule  3  . phenytoin (DILANTIN) 50 MG tablet Chew 1 tablet (50 mg total) by mouth every evening.  90 tablet  3  . HYDROcodone-acetaminophen (NORCO/VICODIN) 5-325 MG per tablet Take 1-2 tablets by mouth every 6 (six) hours as needed.  13 tablet  0   No facility-administered medications prior to visit.    PAST MEDICAL HISTORY: Past Medical History  Diagnosis Date  . Seizure   . MS (multiple sclerosis)     PAST SURGICAL HISTORY: Past Surgical History  Procedure Laterality Date  . Hysterotomy    . Abdominal hysterectomy      FAMILY HISTORY: Family History  Problem Relation Age of Onset  . Heart attack Father   . Colon cancer Mother     SOCIAL HISTORY: History   Social History  . Marital Status: Married    Spouse Name: [name]    Number of Children: 0  . Years of Education: N/A   Occupational History  . Not on file.   Social History Main Topics  . Smoking status: Former Games developer  . Smokeless tobacco: Never Used  . Alcohol Use: No  . Drug Use: No  . Sexual Activity: Not on file   Other Topics Concern  . Not on file   Social History Narrative   Patient is married and does not work.  Patient quit smoking in 2007.     PHYSICAL EXAM  Filed Vitals:   09/05/13 1323  BP: 128/81  Pulse: 67  Height: 5\' 4"  (1.626 m)  Weight: 104 lb (47.174 kg)   Body mass index is 17.84 kg/(m^2). General: well developed, well nourished, seated, in no evident distress  Head: head normocephalic and atraumatic. Oropharynx benign  Neck: supple with no carotid bruits   Neurologic Exam  Mental Status: Awake and fully alert. Oriented to place and time. Follows all commands. Speech and language normal.  Cranial Nerves: Pupils equal, briskly reactive to light. Extraocular movements full  without nystagmus. Visual fields full to confrontation. Hearing intact and symmetric to finger snap. Facial sensation intact. Face, tongue, palate move normally and symmetrically. Neck flexion and extension normal.  Motor: Normal bulk and tone. Normal strength in all tested extremity muscles except 4/5 weakness in left lower extremity  Sensory.: intact to touch and pinprick and vibratory.  Coordination: Rapid alternating movements normal in all extremities. Finger-to-nose and heel-to-shin performed accurately bilaterally.  Gait and Station: Arises from chair without difficulty. Stance is normal. Gait demonstrates normal stride length and balance . Able to heel, toe and mildly unsteady with tandem walk  No assistive device  Reflexes: 1+ and symmetric. Toes downgoing.   DIAGNOSTIC DATA (LABS, IMAGING, TESTING) - I reviewed patient records, labs, notes, testing and imaging myself where available.  Lab Results  Component Value Date   WBC 5.3 09/06/2012   HGB 13.3 09/06/2012   HCT 39.7 09/06/2012   MCV 81 09/06/2012   PLT 168 03/26/2006      Component Value Date/Time   NA 141 09/06/2012 0000   NA 140 03/26/2006 1947   K 4.1 09/06/2012 0000   CL 102 09/06/2012 0000   CO2 22 09/06/2012 0000   GLUCOSE 79 09/06/2012 0000   GLUCOSE 79 03/26/2006 1947   BUN 17 09/06/2012 0000   BUN 15 03/26/2006 1947   CREATININE 0.79 09/06/2012 0000   CALCIUM 9.5 09/06/2012 0000   PROT 7.0 09/06/2012 0000   PROT 7.5 03/26/2006 1947   ALBUMIN 4.6 03/26/2006 1947   AST 16 09/06/2012 0000   ALT 12 09/06/2012 0000   ALKPHOS 56 09/06/2012 0000   BILITOT 0.2 09/06/2012 0000   GFRNONAA 85 09/06/2012 0000   GFRAA 97 09/06/2012 0000    ASSESSMENT AND PLAN  58 y.o. year old female  has a past medical history of Seizure and MS (multiple sclerosis). here to followup. She is a patient of Dr. Anne HahnWillis who is out of the office.  Continue Dilantin at current dose will refill   Continue Rebif at current dose, will refill Will check labs  F/U yearly and  prn Nilda RiggsNancy Carolyn Harper Vandervoort, [facility]NP, [facility], APRN  [facility] [address], [address] [place], [place] [id number] [phone]

## 2013-09-05 NOTE — Patient Instructions (Signed)
Continue Dilantin at current dose.  Continue Rebif at current dose Will check labs  F/U yearly and prn

## 2013-09-06 LAB — CBC WITH DIFFERENTIAL/PLATELET
Basophils Absolute: 0 10*3/uL (ref 0.0–0.2)
Basos: 0 %
Eos: 0 %
Eosinophils Absolute: 0 10*3/uL (ref 0.0–0.4)
HCT: 36.1 % (ref 34.0–46.6)
Hemoglobin: 12.1 g/dL (ref 11.1–15.9)
Immature Grans (Abs): 0 10*3/uL (ref 0.0–0.1)
Immature Granulocytes: 0 %
Lymphocytes Absolute: 1.5 10*3/uL (ref 0.7–3.1)
Lymphs: 30 %
MCH: 26.9 pg (ref 26.6–33.0)
MCHC: 33.5 g/dL (ref 31.5–35.7)
MCV: 80 fL (ref 79–97)
Monocytes Absolute: 0.5 10*3/uL (ref 0.1–0.9)
Monocytes: 9 %
Neutrophils Absolute: 3.1 10*3/uL (ref 1.4–7.0)
Neutrophils Relative %: 61 %
RBC: 4.49 x10E6/uL (ref 3.77–5.28)
RDW: 14.3 % (ref 12.3–15.4)
WBC: 5.1 10*3/uL (ref 3.4–10.8)

## 2013-09-06 LAB — COMPREHENSIVE METABOLIC PANEL
ALT: 15 IU/L (ref 0–32)
AST: 14 IU/L (ref 0–40)
Albumin/Globulin Ratio: 1.7 (ref 1.1–2.5)
Albumin: 4.1 g/dL (ref 3.5–5.5)
Alkaline Phosphatase: 53 IU/L (ref 39–117)
BUN/Creatinine Ratio: 15 (ref 9–23)
BUN: 14 mg/dL (ref 6–24)
CO2: 25 mmol/L (ref 18–29)
Calcium: 9.1 mg/dL (ref 8.7–10.2)
Chloride: 103 mmol/L (ref 97–108)
Creatinine, Ser: 0.92 mg/dL (ref 0.57–1.00)
GFR calc Af Amer: 80 mL/min/{1.73_m2} (ref >59)
GFR calc non Af Amer: 70 mL/min/{1.73_m2} (ref >59)
Globulin, Total: 2.4 g/dL (ref 1.5–4.5)
Glucose: 82 mg/dL (ref 65–99)
Potassium: 3.8 mmol/L (ref 3.5–5.2)
Sodium: 143 mmol/L (ref 134–144)
Total Bilirubin: 0.2 mg/dL (ref 0.0–1.2)
Total Protein: 6.5 g/dL (ref 6.0–8.5)

## 2013-09-06 LAB — PHENYTOIN LEVEL, TOTAL: Phenytoin Lvl: 8.7 ug/mL — ABNORMAL LOW (ref 10.0–20.0)

## 2013-12-29 ENCOUNTER — Other Ambulatory Visit: Payer: Self-pay

## 2013-12-29 DIAGNOSIS — Z1231 Encounter for screening mammogram for malignant neoplasm of breast: Secondary | ICD-10-CM

## 2014-01-12 ENCOUNTER — Telehealth: Payer: Self-pay | Admitting: Nurse Practitioner

## 2014-01-12 NOTE — Telephone Encounter (Signed)
Patient stated [facility] requesting information stating Rx interferon beta-1a (REBIF REBIDOSE) 44 MCG/0.5ML injection need to be continued.  Case # [id number], phone # [phone].  Please call and advise.

## 2014-01-12 NOTE — Telephone Encounter (Signed)
I called [facility].  Provided clinical info they required.  Request is currently under review.  I called the patient back.  Got no answer.  Left message.

## 2014-01-19 ENCOUNTER — Ambulatory Visit: Payer: BC Managed Care – PPO

## 2014-01-26 ENCOUNTER — Ambulatory Visit
Admission: RE | Admit: 2014-01-26 | Discharge: 2014-01-26 | Disposition: A | Discharge status: Home / Self Care | Payer: BC Managed Care – PPO | Source: Ambulatory Visit

## 2014-01-26 DIAGNOSIS — Z1231 Encounter for screening mammogram for malignant neoplasm of breast: Secondary | ICD-10-CM

## 2014-08-02 ENCOUNTER — Other Ambulatory Visit: Payer: Self-pay | Admitting: Nurse Practitioner

## 2014-09-08 ENCOUNTER — Ambulatory Visit (INDEPENDENT_AMBULATORY_CARE_PROVIDER_SITE_OTHER): Payer: BLUE CROSS/BLUE SHIELD | Admitting: Nurse Practitioner

## 2014-09-08 ENCOUNTER — Encounter: Payer: Self-pay | Admitting: Nurse Practitioner

## 2014-09-08 VITALS — BP 122/62 | HR 77 | Ht 64.0 in | Wt 103.4 lb

## 2014-09-08 DIAGNOSIS — G35 Multiple sclerosis: Secondary | ICD-10-CM | POA: Diagnosis not present

## 2014-09-08 DIAGNOSIS — Z5181 Encounter for therapeutic drug level monitoring: Secondary | ICD-10-CM | POA: Diagnosis not present

## 2014-09-08 DIAGNOSIS — G40209 Localization-related (focal) (partial) symptomatic epilepsy and epileptic syndromes with complex partial seizures, not intractable, without status epilepticus: Secondary | ICD-10-CM

## 2014-09-08 MED ORDER — PHENYTOIN 50 MG PO CHEW: CHEWABLE_TABLET | ORAL | Status: DC

## 2014-09-08 MED ORDER — INTERFERON BETA-1A 44 MCG/0.5ML ~~LOC~~ SOSY
44.0000 ug | PREFILLED_SYRINGE | SUBCUTANEOUS | Status: DC
Start: 2014-09-08 — End: 2015-08-05

## 2014-09-08 MED ORDER — PHENYTOIN SODIUM EXTENDED 100 MG PO CAPS: ORAL_CAPSULE | ORAL | Status: DC

## 2014-09-08 NOTE — Progress Notes (Signed)
I have read the note, and I agree with the clinical assessment and plan.  Srihith Aquilino KEITH   

## 2014-09-08 NOTE — Progress Notes (Signed)
[facility]  PATIENT: Sherri Ramirez DOB: 02-14-1955   REASON FOR VISIT: Follow-up for relapsing remitting multiple sclerosis and seizure disorder  HISTORY FROM: Patient    HISTORY OF PRESENT ILLNESS:Sherri Ramirez 59 year old female returns for followup. She was last seen in this office 09/05/13. She has a history of multiple sclerosis and is currently on Rebif without side effects or site injection problems She has had a history of seizure disorder, well controlled on Dilantin last seizure 1999. Multiple sclerosis was diagnosed in March 2004. She was initiated on Copaxone and then had an extreme allergic reaction and was initiated on Betaseron in April of 2007. Insurance will no longer cover Betaseron and she was initiated on Rebif in February 2015. She denies injection site problems. Denies double vision, no falls, no sensory changes, no spasticity.Denies loss of bowel or bladder control, no speech difficulties. She has been on disability from her job since January 2009. Marland KitchenShe returns for reevaluation.    REVIEW OF SYSTEMS: Full 14 system review of systems performed and notable only for those listed, all others are neg:  Constitutional: neg  Cardiovascular: neg Ear/Nose/Throat: neg  Skin: neg Eyes: neg Respiratory: neg Gastroitestinal: neg  Hematology/Lymphatic: neg  Endocrine: neg Musculoskeletal:neg Allergy/Immunology: neg Neurological: neg Psychiatric: neg Sleep : neg   ALLERGIES: Allergies  Allergen Reactions  . Carbamazepine   . Glatiramer Acetate     REACTION: anaphylaxis    HOME MEDICATIONS: Outpatient Prescriptions Prior to Visit  Medication Sig Dispense Refill  . alendronate (FOSAMAX) 70 MG tablet Take 70 mg by mouth every 7 (seven) days. Take with a full glass of water on an empty stomach.    Marland Kitchen FA-Cyanocobalamin-B6-D-Ca (D 1000 PLUS) TABS Take by mouth daily.    . interferon beta-1a (REBIF REBIDOSE) 44 MCG/0.5ML injection Inject 0.5 mLs (44 mcg  total) into the skin 3 (three) times a week. 18 mL 3  . Multiple Vitamins-Minerals (MULTIVITAMIN PO) Take by mouth daily.    . phenytoin (DILANTIN) 100 MG ER capsule TAKE 2 CAPSULES (200 MG TOTAL) EVERY EVENING 180 capsule 0  . phenytoin (DILANTIN) 50 MG tablet CHEW 1 TABLET EVERY EVENING 90 tablet 0   No facility-administered medications prior to visit.    PAST MEDICAL HISTORY: Past Medical History  Diagnosis Date  . Seizure   . MS (multiple sclerosis)     PAST SURGICAL HISTORY: Past Surgical History  Procedure Laterality Date  . Hysterotomy    . Abdominal hysterectomy      FAMILY HISTORY: Family History  Problem Relation Age of Onset  . Heart attack Father   . Colon cancer Mother     SOCIAL HISTORY: History   Social History  . Marital Status: Married    Spouse Name: [name]  . Number of Children: 0  . Years of Education: N/A   Occupational History  . Not on file.   Social History Main Topics  . Smoking status: Former Games developer  . Smokeless tobacco: Never Used  . Alcohol Use: No  . Drug Use: No  . Sexual Activity: Not on file   Other Topics Concern  . Not on file   Social History Narrative   Patient is married and does not work.    Patient quit smoking in 2007.     PHYSICAL EXAM  Filed Vitals:   09/08/14 1414  BP: 112/69  Pulse: 77  Height:  (1.626 m)  Weight: 103 lb 6.4 oz (46.902 kg)   Body mass index is  17.74 kg/(m^2). General: well developed, well nourished, seated, in no evident distress  Head: head normocephalic and atraumatic. Oropharynx benign  Neck: supple with no carotid bruits   Neurologic Exam  Mental Status: Awake and fully alert. Oriented to place and time. Follows all commands. Speech and language normal.  Cranial Nerves: Pupils equal, briskly reactive to light. Extraocular movements full without nystagmus. Visual fields full to confrontation. Hearing intact and symmetric to finger snap. Facial sensation intact. Face,  tongue, palate move normally and symmetrically. Neck flexion and extension normal.  Motor: Normal bulk and tone. Normal strength in all tested extremity muscles except 4/5 weakness in left lower extremity  Sensory.: intact to touch and pinprick and vibratory.  Coordination: Rapid alternating movements normal in all extremities. Finger-to-nose and heel-to-shin performed accurately bilaterally.  Gait and Station: Arises from chair without difficulty. Stance is normal. Gait demonstrates normal stride length and balance . Able to heel, toe and mildly unsteady with tandem walk No assistive device  Reflexes: 1+ and symmetric. Toes downgoing.   DIAGNOSTIC DATA (LABS, IMAGING, TESTING) -  ASSESSMENT AND PLAN  59 y.o. year old female  has a past medical history of Seizure and MS (multiple sclerosis). here to follow-up. She is currently on Requip without exacerbation of symptoms. She is currently on Dilantin without seizure activity.  Continue Dilantin at current dose will refill  Continue Rebif at current dose, will refill Will check labs  F/U yearly and prn Nilda Riggs, [facility], [facility], APRN  [facility] [address], [address] [place], [place] [address] [phone]

## 2014-09-08 NOTE — Patient Instructions (Addendum)
Continue Dilantin at current dose will refill  Continue Rebif at current dose, will refill Will check labs  F/U yearly and prn  

## 2014-09-09 ENCOUNTER — Telehealth: Payer: Self-pay | Admitting: *Deleted

## 2014-09-09 LAB — COMPREHENSIVE METABOLIC PANEL
ALT: 14 IU/L (ref 0–32)
AST: 21 IU/L (ref 0–40)
Albumin/Globulin Ratio: 1.8 (ref 1.1–2.5)
Albumin: 4.7 g/dL (ref 3.5–5.5)
Alkaline Phosphatase: 71 IU/L (ref 39–117)
BUN/Creatinine Ratio: 17 (ref 9–23)
BUN: 17 mg/dL (ref 6–24)
Bilirubin Total: 0.2 mg/dL (ref 0.0–1.2)
CO2: 25 mmol/L (ref 18–29)
Calcium: 10.1 mg/dL (ref 8.7–10.2)
Chloride: 102 mmol/L (ref 97–108)
Creatinine, Ser: 0.98 mg/dL (ref 0.57–1.00)
GFR calc Af Amer: 74 mL/min/{1.73_m2} (ref >59)
GFR calc non Af Amer: 64 mL/min/{1.73_m2} (ref >59)
Globulin, Total: 2.6 g/dL (ref 1.5–4.5)
Glucose: 102 mg/dL — ABNORMAL HIGH (ref 65–99)
Potassium: 5.7 mmol/L — ABNORMAL HIGH (ref 3.5–5.2)
Sodium: 142 mmol/L (ref 134–144)
Total Protein: 7.3 g/dL (ref 6.0–8.5)

## 2014-09-09 LAB — CBC WITH DIFFERENTIAL/PLATELET
Basophils Absolute: 0 10*3/uL (ref 0.0–0.2)
Basos: 0 %
EOS (ABSOLUTE): 0 10*3/uL (ref 0.0–0.4)
Eos: 0 %
Hematocrit: 37.8 % (ref 34.0–46.6)
Hemoglobin: 12.4 g/dL (ref 11.1–15.9)
Immature Grans (Abs): 0 10*3/uL (ref 0.0–0.1)
Immature Granulocytes: 0 %
Lymphocytes Absolute: 1.6 10*3/uL (ref 0.7–3.1)
Lymphs: 29 %
MCH: 26.4 pg — ABNORMAL LOW (ref 26.6–33.0)
MCHC: 32.8 g/dL (ref 31.5–35.7)
MCV: 81 fL (ref 79–97)
Monocytes Absolute: 0.5 10*3/uL (ref 0.1–0.9)
Monocytes: 9 %
Neutrophils Absolute: 3.4 10*3/uL (ref 1.4–7.0)
Neutrophils: 62 %
Platelets: 151 10*3/uL (ref 150–379)
RBC: 4.69 x10E6/uL (ref 3.77–5.28)
RDW: 14.3 % (ref 12.3–15.4)
WBC: 5.6 10*3/uL (ref 3.4–10.8)

## 2014-09-09 LAB — PHENYTOIN LEVEL, TOTAL: Phenytoin (Dilantin), Serum: 6.6 ug/mL — ABNORMAL LOW (ref 10.0–20.0)

## 2014-09-09 NOTE — Telephone Encounter (Signed)
-----   Message from Nilda Riggs, NP sent at 09/09/2014  4:28 PM EDT ----- Please let patient know labs good, Dilantin level a little low but no seizures in  years

## 2014-09-09 NOTE — Progress Notes (Signed)
Quick Note:  I called pt and relayed the results of the labs to the pt. She verbalized understanding. ______

## 2014-09-09 NOTE — Telephone Encounter (Signed)
I called pt and relayed the lab results to her.   No change in her medications. Pt verbalized understanding.

## 2014-10-05 ENCOUNTER — Other Ambulatory Visit: Payer: Self-pay | Admitting: Nurse Practitioner

## 2015-04-22 ENCOUNTER — Telehealth: Payer: Self-pay

## 2015-04-22 NOTE — Telephone Encounter (Signed)
Fax from [employer].  Rebif has been approved for one year.

## 2015-04-27 NOTE — Telephone Encounter (Signed)
Rn call Vinny at [employer] at [phone]. Colbert Coyer stated the prescription signature form was not clear. Form was fax again.Rn explain [place] NP was on vacation this week. Colbert Coyer stated he understood and will refax form to have it filled out correctly.

## 2015-04-27 NOTE — Telephone Encounter (Signed)
Vinny/[facility] [employer] [phone] called regarding Rebif prescription. States patient's name was written over Doctor's name and vice versa. Will refax to [place].

## 2015-04-27 NOTE — Telephone Encounter (Signed)
Redid order

## 2015-05-03 ENCOUNTER — Other Ambulatory Visit: Payer: Self-pay

## 2015-05-03 DIAGNOSIS — Z1231 Encounter for screening mammogram for malignant neoplasm of breast: Secondary | ICD-10-CM

## 2015-05-11 NOTE — Telephone Encounter (Signed)
[facility] pharmacy called to see about a refill request for REBIF REBIDOSE 44 MCG/0.5ML SOAJ. Please [phone] anyone can help you

## 2015-05-12 NOTE — Telephone Encounter (Signed)
Clarified order with Renae Fickle.  Rebidose 72mcg/0.5ml  Sq 3 x weekly.  36 doses and 3 refills.

## 2015-05-19 ENCOUNTER — Ambulatory Visit
Admission: RE | Admit: 2015-05-19 | Discharge: 2015-05-19 | Disposition: A | Discharge status: Home / Self Care | Payer: Managed Care, Other (non HMO) | Source: Ambulatory Visit

## 2015-05-19 DIAGNOSIS — Z1231 Encounter for screening mammogram for malignant neoplasm of breast: Secondary | ICD-10-CM

## 2015-05-25 ENCOUNTER — Telehealth: Payer: Self-pay | Admitting: Nurse Practitioner

## 2015-05-25 NOTE — Telephone Encounter (Signed)
Patient is calling to get a 90 day supply Rx interferon beta-1a 44 mcg/0.5 ml sosy injection setn to [facility].  Thanks!

## 2015-05-25 NOTE — Telephone Encounter (Addendum)
I called and spoke to Mardi Mainland, pharmacist.  He stated did have order.  Per insurance needed to have 2 more months of 28 days then would be able to get 3 month supply.  It is [facility].

## 2015-05-26 NOTE — Telephone Encounter (Signed)
I called and let pt know below.  She stated she will call for refill.

## 2015-08-05 ENCOUNTER — Telehealth: Payer: Self-pay | Admitting: Nurse Practitioner

## 2015-08-05 MED ORDER — INTERFERON BETA-1A 44 MCG/0.5ML ~~LOC~~ SOAJ
44.0000 ug | SUBCUTANEOUS | Status: DC
Start: 2015-08-05 — End: 2015-09-21

## 2015-08-05 NOTE — Telephone Encounter (Signed)
Prescription escribed to [facility] for 90 day supply and 3 RF.  Pt has appt 09-21-15 at 1100.

## 2015-08-05 NOTE — Telephone Encounter (Signed)
Patient called to request refill (90 day supply) and renewal of REBIF REBIDOSE 44 MCG/0.5ML SOAJ, was advised by [facility] pharmacy, prescription has expired.

## 2015-09-08 ENCOUNTER — Ambulatory Visit: Payer: BLUE CROSS/BLUE SHIELD | Admitting: Nurse Practitioner

## 2015-09-21 ENCOUNTER — Encounter: Payer: Self-pay | Admitting: Nurse Practitioner

## 2015-09-21 ENCOUNTER — Ambulatory Visit (INDEPENDENT_AMBULATORY_CARE_PROVIDER_SITE_OTHER): Payer: Managed Care, Other (non HMO) | Admitting: Nurse Practitioner

## 2015-09-21 VITALS — BP 113/80 | HR 80 | Ht 64.0 in | Wt 102.8 lb

## 2015-09-21 DIAGNOSIS — G35 Multiple sclerosis: Secondary | ICD-10-CM

## 2015-09-21 DIAGNOSIS — Z5181 Encounter for therapeutic drug level monitoring: Secondary | ICD-10-CM

## 2015-09-21 DIAGNOSIS — G40209 Localization-related (focal) (partial) symptomatic epilepsy and epileptic syndromes with complex partial seizures, not intractable, without status epilepticus: Secondary | ICD-10-CM | POA: Diagnosis not present

## 2015-09-21 MED ORDER — INTERFERON BETA-1A 44 MCG/0.5ML ~~LOC~~ SOAJ: 44.0000 ug | SUBCUTANEOUS | 3 refills | Status: DC

## 2015-09-21 NOTE — Progress Notes (Signed)
I have read the note, and I agree with the clinical assessment and plan.  Raine Blodgett KEITH   

## 2015-09-21 NOTE — Patient Instructions (Signed)
Continue Dilantin at current dose will refill  Continue Rebif at current dose, will refill Will check labs  F/U yearly and prn

## 2015-09-21 NOTE — Progress Notes (Signed)
[facility]  PATIENT: Sherri StaplerLynn F Shugrue DOB: 09-07-1955   REASON FOR VISIT: Follow-up for relapsing remitting multiple sclerosis and seizure disorder  HISTORY FROM: Patient    HISTORY OF PRESENT ILLNESS:Ms. Sherri Ramirez 60 year old female returns for yearly followup.  She has a history of multiple sclerosis and is currently on Rebif without side effects or site injection problems She has had a history of seizure disorder, well controlled on Dilantin last seizure 1999. Multiple sclerosis was diagnosed in March 2004. She was initiated on Copaxone and then had an extreme allergic reaction and was initiated on Betaseron in April of 2007. Insurance will no longer cover Betaseron and she was initiated on Rebif in February 2015.  Denies double vision, no falls, no sensory changes, no spasticity.Denies loss of bowel or bladder control, no speech difficulties. She has been on disability from her job since January 2009. She returns for reevaluation.    REVIEW OF SYSTEMS: Full 14 system review of systems performed and notable only for those listed, all others are neg:  Constitutional: neg  Cardiovascular: neg Ear/Nose/Throat: neg  Skin: neg Eyes: neg Respiratory: neg Gastroitestinal: neg  Hematology/Lymphatic: neg  Endocrine: neg Musculoskeletal:neg Allergy/Immunology: neg Neurological: neg Psychiatric: neg Sleep : neg   ALLERGIES: Allergies  Allergen Reactions  . Carbamazepine   . Glatiramer Acetate     REACTION: anaphylaxis    HOME MEDICATIONS: Outpatient Medications Prior to Visit  Medication Sig Dispense Refill  . alendronate (FOSAMAX) 70 MG tablet Take 70 mg by mouth every 7 (seven) days. Take with a full glass of water on an empty stomach.    . cholecalciferol (VITAMIN D) 1000 UNITS tablet Take 2,000 Units by mouth daily.    . Interferon Beta-1a (REBIF REBIDOSE) 44 MCG/0.5ML SOAJ Inject 44 mcg into the skin 3 (three) times a week. 18 mL 3  . phenytoin (DILANTIN)  100 MG ER capsule TAKE 2 CAPSULES (200 MG TOTAL) EVERY EVENING 180 capsule 3  . phenytoin (DILANTIN) 50 MG tablet CHEW 1 TABLET EVERY EVENING 90 tablet 3  . FA-Cyanocobalamin-B6-D-Ca (D 1000 PLUS) TABS Take by mouth daily.    . Multiple Vitamins-Minerals (MULTIVITAMIN PO) Take by mouth daily.     No facility-administered medications prior to visit.     PAST MEDICAL HISTORY: Past Medical History:  Diagnosis Date  . MS (multiple sclerosis) (HCC)   . Seizure (HCC)     PAST SURGICAL HISTORY: Past Surgical History:  Procedure Laterality Date  . ABDOMINAL HYSTERECTOMY    . HYSTEROTOMY      FAMILY HISTORY: Family History  Problem Relation Age of Onset  . Colon cancer Mother   . Heart attack Father     SOCIAL HISTORY: Social History   Social History  . Marital status: Married    Spouse name: Sherri Ramirez  . Number of children: 0  . Years of education: N/A   Occupational History  . Not on file.   Social History Main Topics  . Smoking status: Former Games developermoker  . Smokeless tobacco: Never Used  . Alcohol use No  . Drug use: No  . Sexual activity: Not on file   Other Topics Concern  . Not on file   Social History Narrative   Patient is married and does not work.    Patient quit smoking in 2007.     PHYSICAL EXAM  Vitals:   09/21/15 1108  BP: 113/80  Pulse: 80  Weight: 102 lb 12.8 oz (46.6 kg)  Height: 5\' 4"  (1.626 m)  Body mass index is 17.65 kg/m. General: well developed, well nourished, seated, in no evident distress  Head: head normocephalic and atraumatic. Oropharynx benign  Neck: supple with no carotid bruits   Neurologic Exam  Mental Status: Awake and fully alert. Oriented to place and time. Follows all commands. Speech and language normal.  Cranial Nerves: Pupils equal, briskly reactive to light. Extraocular movements full without nystagmus. Visual fields full to confrontation. Hearing intact and symmetric to finger snap. Facial sensation intact. Face,  tongue, palate move normally and symmetrically. Neck flexion and extension normal.  Motor: Normal bulk and tone. Normal strength in all tested extremity muscles .  Sensory.: intact to touch and pinprick and vibratory in the upper and lower extremities.  Coordination: Rapid alternating movements normal in all extremities. Finger-to-nose and heel-to-shin performed accurately bilaterally.  Gait and Station: Arises from chair without difficulty. Stance is normal. Gait demonstrates normal stride length and balance . Able to heel, toe and mildly unsteady with tandem walk No assistive device  Reflexes: 1+ and symmetric. Toes downgoing.   DIAGNOSTIC DATA (LABS, IMAGING, TESTING) -  ASSESSMENT AND PLAN  59 y.o. year old female  has a past medical history of MS (multiple sclerosis) (HCC) and Seizure (HCC). here to follow-up. She is currently on Rebif without exacerbation of symptoms. She is currently on Dilantin without seizure activity.  Continue Dilantin at current dose will refillwhen level is back  Continue Rebif at current dose, will refill Will check labs CBC, CMP and Dilantin level F/U yearly and prn Nilda Riggs, [facility], [facility], APRN  [facility] [address] [place], [place] [address] [phone]

## 2015-09-22 ENCOUNTER — Telehealth: Payer: Self-pay | Admitting: *Deleted

## 2015-09-22 ENCOUNTER — Other Ambulatory Visit: Payer: Self-pay | Admitting: Nurse Practitioner

## 2015-09-22 LAB — CBC WITH DIFFERENTIAL/PLATELET
Basophils Absolute: 0 10*3/uL (ref 0.0–0.2)
Basos: 0 %
EOS (ABSOLUTE): 0 10*3/uL (ref 0.0–0.4)
Eos: 0 %
Hematocrit: 37.3 % (ref 34.0–46.6)
Hemoglobin: 12 g/dL (ref 11.1–15.9)
Immature Grans (Abs): 0 10*3/uL (ref 0.0–0.1)
Immature Granulocytes: 1 %
Lymphocytes Absolute: 1.5 10*3/uL (ref 0.7–3.1)
Lymphs: 28 %
MCH: 26.6 pg (ref 26.6–33.0)
MCHC: 32.2 g/dL (ref 31.5–35.7)
MCV: 83 fL (ref 79–97)
Monocytes Absolute: 0.4 10*3/uL (ref 0.1–0.9)
Monocytes: 8 %
Neutrophils Absolute: 3.4 10*3/uL (ref 1.4–7.0)
Neutrophils: 63 %
Platelets: 174 10*3/uL (ref 150–379)
RBC: 4.51 x10E6/uL (ref 3.77–5.28)
RDW: 14.7 % (ref 12.3–15.4)
WBC: 5.4 10*3/uL (ref 3.4–10.8)

## 2015-09-22 LAB — COMPREHENSIVE METABOLIC PANEL
ALT: 12 IU/L (ref 0–32)
AST: 17 IU/L (ref 0–40)
Albumin/Globulin Ratio: 1.4 (ref 1.2–2.2)
Albumin: 4.2 g/dL (ref 3.5–5.5)
Alkaline Phosphatase: 69 IU/L (ref 39–117)
BUN/Creatinine Ratio: 22 (ref 9–23)
BUN: 17 mg/dL (ref 6–24)
Bilirubin Total: 0.3 mg/dL (ref 0.0–1.2)
CO2: 25 mmol/L (ref 18–29)
Calcium: 9.8 mg/dL (ref 8.7–10.2)
Chloride: 101 mmol/L (ref 96–106)
Creatinine, Ser: 0.78 mg/dL (ref 0.57–1.00)
GFR calc Af Amer: 97 mL/min/{1.73_m2} (ref >59)
GFR calc non Af Amer: 84 mL/min/{1.73_m2} (ref >59)
Globulin, Total: 3.1 g/dL (ref 1.5–4.5)
Glucose: 94 mg/dL (ref 65–99)
Potassium: 5.2 mmol/L (ref 3.5–5.2)
Sodium: 142 mmol/L (ref 134–144)
Total Protein: 7.3 g/dL (ref 6.0–8.5)

## 2015-09-22 LAB — PHENYTOIN LEVEL, TOTAL: Phenytoin (Dilantin), Serum: 5.5 ug/mL — ABNORMAL LOW (ref 10.0–20.0)

## 2015-09-22 MED ORDER — PHENYTOIN 50 MG PO CHEW: CHEWABLE_TABLET | ORAL | 3 refills | Status: DC

## 2015-09-22 MED ORDER — PHENYTOIN SODIUM EXTENDED 100 MG PO CAPS: ORAL_CAPSULE | ORAL | 3 refills | Status: DC

## 2015-09-22 NOTE — Telephone Encounter (Signed)
LMVM for pt that called about her lab results.  Will touch base with her tomorrow.

## 2015-09-22 NOTE — Telephone Encounter (Signed)
-----   Message from Nilda Riggs, NP sent at 09/22/2015  3:17 PM EDT ----- Labs good, Dilantin still subtherapeutic but no seizure in 19 years. Continue same dose call for seizure activity

## 2015-09-23 NOTE — Telephone Encounter (Signed)
Spoke to pt and relayed that labs good, dilantin subtherapeutic, but no change in med dose since no seizures in last 44yrs. She verbalized understanding.

## 2015-09-23 NOTE — Telephone Encounter (Signed)
Pt returned RN's call °

## 2016-03-02 ENCOUNTER — Other Ambulatory Visit: Payer: Self-pay | Admitting: Nurse Practitioner

## 2016-04-04 DIAGNOSIS — Z0271 Encounter for disability determination: Secondary | ICD-10-CM

## 2016-04-12 ENCOUNTER — Telehealth: Payer: Self-pay | Admitting: *Deleted

## 2016-04-12 NOTE — Telephone Encounter (Addendum)
[facility]ed [facility]. Gave clinical information over the phone for rx rebif. [place] approved 03/13/16-04/12/17. ZO#[id number]. Qty 36 syringes/90day  Clinical info: Pt diagnosed with MS March 2004. Initiated on copaxone (had severe allergic reaction). Started on Betaseron April 2007 (insurance did not cover). Switched to Rebif Feb 2015. Tolerating well w/o exacerbation of sx. Dx g35, relapsing remitting MS.

## 2016-07-04 ENCOUNTER — Other Ambulatory Visit: Payer: Self-pay | Admitting: Neurology

## 2016-08-09 ENCOUNTER — Other Ambulatory Visit: Payer: Self-pay | Admitting: *Deleted

## 2016-08-09 MED ORDER — INTERFERON BETA-1A 44 MCG/0.5ML ~~LOC~~ SOAJ: 44.0000 ug | SUBCUTANEOUS | 10 refills | Status: DC

## 2016-09-26 NOTE — Progress Notes (Addendum)
[facility]  PATIENT: Sherri Ramirez DOB: [birth year]   REASON FOR VISIT: Follow-up for relapsing remitting multiple sclerosis and seizure disorder  HISTORY FROM: Patient    HISTORY OF PRESENT ILLNESS:Sherri Ramirez 61 year old Ramirez returns for yearly followup.  She has a history of multiple sclerosis and is currently on Rebif without side effects or site injection problems. Last MRI of the brain 2004. She refuses further MRI she says disease has been stable.  She has had a history of seizure disorder, well controlled on Dilantin last seizure 1999. Multiple sclerosis was diagnosed in March 2004. She was initiated on Copaxone and then had an extreme allergic reaction and was initiated on Betaseron in April of 2007. Insurance will no longer cover Betaseron and she was initiated on Rebif in February 2015.  Denies double vision, no falls, no sensory changes, no spasticity.Denies loss of bowel or bladder control, no speech difficulties. She has been on disability from her job since January 2009. She returns for reevaluation.    REVIEW OF SYSTEMS: Full 14 system review of systems performed and notable only for those listed, all others are neg:  Constitutional: neg  Cardiovascular: neg Ear/Nose/Throat: neg  Skin: neg Eyes: neg Respiratory: neg Gastroitestinal: neg  Hematology/Lymphatic: neg  Endocrine: neg Musculoskeletal:neg Allergy/Immunology: neg Neurological: neg Psychiatric: neg Sleep : neg   ALLERGIES: Allergies  Allergen Reactions  . Carbamazepine   . Glatiramer Acetate     REACTION: anaphylaxis    HOME MEDICATIONS: Outpatient Medications Prior to Visit  Medication Sig Dispense Refill  . cholecalciferol (VITAMIN D) 1000 UNITS tablet Take 2,000 Units by mouth daily.    Marland Kitchen FA-Cyanocobalamin-B6-D-Ca (D 1000 PLUS) TABS Take by mouth daily.    . Interferon Beta-1a (REBIF REBIDOSE) 44 MCG/0.5ML SOAJ Inject 44 mcg into the skin 3 (three) times a week. 6 mL 10    . Multiple Vitamins-Minerals (MULTIVITAMIN PO) Take by mouth daily.    . phenytoin (DILANTIN) 100 MG ER capsule TAKE 2 CAPSULES EVERY EVENING 180 capsule 1  . phenytoin (DILANTIN) 50 MG tablet CHEW 1 TABLET EVERY EVENING 90 tablet 3  . alendronate (FOSAMAX) 70 MG tablet Take 70 mg by mouth every 7 (seven) days. Take with a full glass of water on an empty stomach.     No facility-administered medications prior to visit.     PAST MEDICAL HISTORY: Past Medical History:  Diagnosis Date  . MS (multiple sclerosis) (HCC)   . Seizure (HCC)     PAST SURGICAL HISTORY: Past Surgical History:  Procedure Laterality Date  . ABDOMINAL HYSTERECTOMY    . HYSTEROTOMY      FAMILY HISTORY: Family History  Problem Relation Age of Onset  . Colon cancer Mother   . Heart attack Father     SOCIAL HISTORY: Social History   Social History  . Marital status: Married    Spouse name: [name]  . Number of children: 0  . Years of education: N/A   Occupational History  . Not on file.   Social History Main Topics  . Smoking status: Former Games developer  . Smokeless tobacco: Never Used  . Alcohol use No  . Drug use: No  . Sexual activity: Not on file   Other Topics Concern  . Not on file   Social History Narrative   Patient is married and does not work.    Patient quit smoking in 2007.     PHYSICAL EXAM  Vitals:   09/27/16 1258  BP: 100/62  Pulse:  77  Weight: 102 lb 6.4 oz (46.4 kg)  Height: 5\' 4"  (1.626 m)   Body mass index is 17.58 kg/m. General: well developed, well nourished, seated, in no evident distress  Head: head normocephalic and atraumatic. Oropharynx benign  Neck: supple with no carotid bruits   Neurologic Exam  Mental Status: Awake and fully alert. Oriented to place and time. Follows all commands. Speech and language normal.  Cranial Nerves: Pupils equal, briskly reactive to light. Extraocular movements full without nystagmus. Visual fields full to confrontation.  Hearing intact and symmetric to finger snap. Facial sensation intact. Face, tongue, palate move normally and symmetrically. Neck flexion and extension normal.  Motor: Normal bulk and tone. Normal strength in all tested extremity muscles .  Sensory.: intact to touch and pinprick and vibratory in the upper and lower extremities.  Coordination: Rapid alternating movements normal in all extremities. Finger-to-nose and heel-to-shin performed accurately bilaterally.  Gait and Station: Arises from chair without difficulty. Stance is normal. Gait demonstrates normal stride length and balance . Able to heel, toe and mildly unsteady with tandem walk No assistive device  Reflexes: 1+ and symmetric. Toes downgoing.   DIAGNOSTIC DATA (LABS, IMAGING, TESTING) -  ASSESSMENT AND PLAN  Sherri Ramirez  has a past medical history of MS (multiple sclerosis) (HCC) and Seizure (HCC). here to follow-up. She is currently on Rebif without exacerbation of symptoms. She is currently on Dilantin without seizure activity.Patient refuses another MRI of the brain. The patient is a current patient of Dr.Willis   who is out of the office today . This note is sent to the work in doctor.     Continue Dilantin at current dose will refillwhen level is back pt is only taking 200mg  at night not the extra 50mg  Continue Rebif at current dose, will refill Will check labs CBC, CMP and Dilantin level F/U yearly and prn Nilda Riggs, [facility], [facility], APRN  [facility] [address], [address] [place], [place] [address] [phone]  I reviewed the above note and documentation by the Nurse Practitioner and agree with the history, physical exam, assessment and plan as outlined above. I was immediately available for face-to-face consultation. Huston Foley, MD, PhD [facility])

## 2016-09-27 ENCOUNTER — Ambulatory Visit (INDEPENDENT_AMBULATORY_CARE_PROVIDER_SITE_OTHER): Payer: Managed Care, Other (non HMO) | Admitting: Nurse Practitioner

## 2016-09-27 ENCOUNTER — Encounter: Payer: Self-pay | Admitting: Nurse Practitioner

## 2016-09-27 ENCOUNTER — Encounter (INDEPENDENT_AMBULATORY_CARE_PROVIDER_SITE_OTHER): Payer: Self-pay

## 2016-09-27 VITALS — BP 100/62 | HR 77 | Ht 64.0 in | Wt 102.4 lb

## 2016-09-27 DIAGNOSIS — G35 Multiple sclerosis: Secondary | ICD-10-CM

## 2016-09-27 DIAGNOSIS — Z5181 Encounter for therapeutic drug level monitoring: Secondary | ICD-10-CM

## 2016-09-27 DIAGNOSIS — G40209 Localization-related (focal) (partial) symptomatic epilepsy and epileptic syndromes with complex partial seizures, not intractable, without status epilepticus: Secondary | ICD-10-CM | POA: Diagnosis not present

## 2016-09-27 MED ORDER — INTERFERON BETA-1A 44 MCG/0.5ML ~~LOC~~ SOAJ: 44.0000 ug | SUBCUTANEOUS | 12 refills | Status: DC

## 2016-09-27 NOTE — Patient Instructions (Signed)
Continue Dilantin at current dose will refillwhen level is back  Continue Rebif at current dose, will refill Will check labs CBC, CMP and Dilantin level F/U yearly and prn

## 2016-09-28 ENCOUNTER — Telehealth: Payer: Self-pay | Admitting: Nurse Practitioner

## 2016-09-28 LAB — CBC WITH DIFFERENTIAL/PLATELET
Basophils Absolute: 0 10*3/uL (ref 0.0–0.2)
Basos: 0 %
EOS (ABSOLUTE): 0 10*3/uL (ref 0.0–0.4)
Eos: 0 %
Hematocrit: 37.5 % (ref 34.0–46.6)
Hemoglobin: 12.2 g/dL (ref 11.1–15.9)
Immature Grans (Abs): 0 10*3/uL (ref 0.0–0.1)
Immature Granulocytes: 0 %
Lymphocytes Absolute: 1.5 10*3/uL (ref 0.7–3.1)
Lymphs: 31 %
MCH: 25.6 pg — ABNORMAL LOW (ref 26.6–33.0)
MCHC: 32.5 g/dL (ref 31.5–35.7)
MCV: 79 fL (ref 79–97)
Monocytes Absolute: 0.3 10*3/uL (ref 0.1–0.9)
Monocytes: 7 %
Neutrophils Absolute: 3 10*3/uL (ref 1.4–7.0)
Neutrophils: 62 %
Platelets: 156 10*3/uL (ref 150–379)
RBC: 4.77 x10E6/uL (ref 3.77–5.28)
RDW: 15.4 % (ref 12.3–15.4)
WBC: 4.9 10*3/uL (ref 3.4–10.8)

## 2016-09-28 LAB — COMPREHENSIVE METABOLIC PANEL
ALT: 12 IU/L (ref 0–32)
AST: 19 IU/L (ref 0–40)
Albumin/Globulin Ratio: 1.5 (ref 1.2–2.2)
Albumin: 4.4 g/dL (ref 3.5–5.5)
Alkaline Phosphatase: 70 IU/L (ref 39–117)
BUN/Creatinine Ratio: 22 (ref 9–23)
BUN: 19 mg/dL (ref 6–24)
Bilirubin Total: 0.2 mg/dL (ref 0.0–1.2)
CO2: 26 mmol/L (ref 20–29)
Calcium: 9.8 mg/dL (ref 8.7–10.2)
Chloride: 101 mmol/L (ref 96–106)
Creatinine, Ser: 0.88 mg/dL (ref 0.57–1.00)
GFR calc Af Amer: 83 mL/min/{1.73_m2} (ref >59)
GFR calc non Af Amer: 72 mL/min/{1.73_m2} (ref >59)
Globulin, Total: 2.9 g/dL (ref 1.5–4.5)
Glucose: 84 mg/dL (ref 65–99)
Potassium: 3.9 mmol/L (ref 3.5–5.2)
Sodium: 141 mmol/L (ref 134–144)
Total Protein: 7.3 g/dL (ref 6.0–8.5)

## 2016-09-28 LAB — PHENYTOIN LEVEL, TOTAL: Phenytoin (Dilantin), Serum: 4.8 ug/mL — ABNORMAL LOW (ref 10.0–20.0)

## 2016-09-28 MED ORDER — PHENYTOIN SODIUM EXTENDED 100 MG PO CAPS: ORAL_CAPSULE | ORAL | 3 refills | Status: DC

## 2016-09-28 NOTE — Telephone Encounter (Signed)
Patient made aware of labs and low Dilantin level. Will increase to 300 mg at night. Rx called in

## 2016-10-26 ENCOUNTER — Other Ambulatory Visit: Payer: Self-pay | Admitting: Internal Medicine

## 2016-10-26 DIAGNOSIS — Z1231 Encounter for screening mammogram for malignant neoplasm of breast: Secondary | ICD-10-CM

## 2016-11-02 ENCOUNTER — Ambulatory Visit
Admission: RE | Admit: 2016-11-02 | Discharge: 2016-11-02 | Disposition: A | Discharge status: Home / Self Care | Payer: Managed Care, Other (non HMO) | Source: Ambulatory Visit | Attending: Internal Medicine | Admitting: Internal Medicine

## 2016-11-02 DIAGNOSIS — Z1231 Encounter for screening mammogram for malignant neoplasm of breast: Secondary | ICD-10-CM

## 2016-12-25 ENCOUNTER — Encounter (HOSPITAL_COMMUNITY): Payer: Self-pay | Admitting: Emergency Medicine

## 2016-12-25 ENCOUNTER — Inpatient Hospital Stay (HOSPITAL_COMMUNITY)
Admission: EM | Admit: 2016-12-25 | Discharge: 2016-12-29 | DRG: 308 | Disposition: A | Discharge status: Home / Self Care | Payer: Managed Care, Other (non HMO) | Attending: Family Medicine | Admitting: Family Medicine

## 2016-12-25 ENCOUNTER — Emergency Department (HOSPITAL_COMMUNITY): Payer: Managed Care, Other (non HMO)

## 2016-12-25 DIAGNOSIS — D649 Anemia, unspecified: Secondary | ICD-10-CM | POA: Diagnosis present

## 2016-12-25 DIAGNOSIS — I5031 Acute diastolic (congestive) heart failure: Secondary | ICD-10-CM | POA: Diagnosis present

## 2016-12-25 DIAGNOSIS — Z5309 Procedure and treatment not carried out because of other contraindication: Secondary | ICD-10-CM | POA: Diagnosis not present

## 2016-12-25 DIAGNOSIS — Y95 Nosocomial condition: Secondary | ICD-10-CM | POA: Diagnosis present

## 2016-12-25 DIAGNOSIS — R079 Chest pain, unspecified: Secondary | ICD-10-CM | POA: Diagnosis not present

## 2016-12-25 DIAGNOSIS — R072 Precordial pain: Secondary | ICD-10-CM

## 2016-12-25 DIAGNOSIS — E876 Hypokalemia: Secondary | ICD-10-CM | POA: Diagnosis present

## 2016-12-25 DIAGNOSIS — J9811 Atelectasis: Secondary | ICD-10-CM | POA: Diagnosis present

## 2016-12-25 DIAGNOSIS — I480 Paroxysmal atrial fibrillation: Secondary | ICD-10-CM | POA: Diagnosis present

## 2016-12-25 DIAGNOSIS — Z79899 Other long term (current) drug therapy: Secondary | ICD-10-CM | POA: Diagnosis not present

## 2016-12-25 DIAGNOSIS — J189 Pneumonia, unspecified organism: Secondary | ICD-10-CM | POA: Diagnosis present

## 2016-12-25 DIAGNOSIS — G40209 Localization-related (focal) (partial) symptomatic epilepsy and epileptic syndromes with complex partial seizures, not intractable, without status epilepticus: Secondary | ICD-10-CM | POA: Diagnosis present

## 2016-12-25 DIAGNOSIS — G35 Multiple sclerosis: Secondary | ICD-10-CM | POA: Diagnosis present

## 2016-12-25 DIAGNOSIS — Z87891 Personal history of nicotine dependence: Secondary | ICD-10-CM | POA: Diagnosis not present

## 2016-12-25 DIAGNOSIS — I4892 Unspecified atrial flutter: Secondary | ICD-10-CM | POA: Diagnosis present

## 2016-12-25 DIAGNOSIS — R0602 Shortness of breath: Secondary | ICD-10-CM | POA: Diagnosis not present

## 2016-12-25 DIAGNOSIS — R071 Chest pain on breathing: Secondary | ICD-10-CM | POA: Diagnosis not present

## 2016-12-25 DIAGNOSIS — I361 Nonrheumatic tricuspid (valve) insufficiency: Secondary | ICD-10-CM | POA: Diagnosis not present

## 2016-12-25 DIAGNOSIS — I300 Acute nonspecific idiopathic pericarditis: Secondary | ICD-10-CM

## 2016-12-25 DIAGNOSIS — I483 Typical atrial flutter: Secondary | ICD-10-CM | POA: Diagnosis present

## 2016-12-25 DIAGNOSIS — R509 Fever, unspecified: Secondary | ICD-10-CM

## 2016-12-25 DIAGNOSIS — I959 Hypotension, unspecified: Secondary | ICD-10-CM | POA: Diagnosis present

## 2016-12-25 LAB — I-STAT BETA HCG BLOOD, ED (MC, WL, AP ONLY): I-stat hCG, quantitative: <5 m[IU]/mL (ref <5)

## 2016-12-25 LAB — COMPREHENSIVE METABOLIC PANEL
ALT: 32 U/L (ref 14–54)
AST: 45 U/L — ABNORMAL HIGH (ref 15–41)
Albumin: 3.7 g/dL (ref 3.5–5.0)
Alkaline Phosphatase: 95 U/L (ref 38–126)
Anion gap: 10 (ref 5–15)
BUN: 26 mg/dL — ABNORMAL HIGH (ref 6–20)
CO2: 28 mmol/L (ref 22–32)
Calcium: 9.6 mg/dL (ref 8.9–10.3)
Chloride: 101 mmol/L (ref 101–111)
Creatinine, Ser: 0.77 mg/dL (ref 0.44–1.00)
GFR calc Af Amer: >60 mL/min (ref >60)
GFR calc non Af Amer: >60 mL/min (ref >60)
Glucose, Bld: 129 mg/dL — ABNORMAL HIGH (ref 65–99)
Potassium: 4.1 mmol/L (ref 3.5–5.1)
Sodium: 139 mmol/L (ref 135–145)
Total Bilirubin: 0.9 mg/dL (ref 0.3–1.2)
Total Protein: 8.6 g/dL — ABNORMAL HIGH (ref 6.5–8.1)

## 2016-12-25 LAB — CBC WITH DIFFERENTIAL/PLATELET
Basophils Absolute: 0 10*3/uL (ref 0.0–0.1)
Basophils Relative: 0 %
Eosinophils Absolute: 0 10*3/uL (ref 0.0–0.7)
Eosinophils Relative: 0 %
HCT: 39.2 % (ref 36.0–46.0)
Hemoglobin: 13 g/dL (ref 12.0–15.0)
Lymphocytes Relative: 10 %
Lymphs Abs: 0.9 10*3/uL (ref 0.7–4.0)
MCH: 27.5 pg (ref 26.0–34.0)
MCHC: 33.2 g/dL (ref 30.0–36.0)
MCV: 83.1 fL (ref 78.0–100.0)
Monocytes Absolute: 1 10*3/uL (ref 0.1–1.0)
Monocytes Relative: 11 %
Neutro Abs: 6.9 10*3/uL (ref 1.7–7.7)
Neutrophils Relative %: 79 %
Platelets: 266 10*3/uL (ref 150–400)
RBC: 4.72 MIL/uL (ref 3.87–5.11)
RDW: 14.1 % (ref 11.5–15.5)
WBC: 8.8 10*3/uL (ref 4.0–10.5)

## 2016-12-25 LAB — MAGNESIUM: Magnesium: 1.8 mg/dL (ref 1.7–2.4)

## 2016-12-25 LAB — LIPASE, BLOOD: Lipase: 24 U/L (ref 11–51)

## 2016-12-25 LAB — I-STAT TROPONIN, ED: Troponin i, poc: 0 ng/mL (ref 0.00–0.08)

## 2016-12-25 LAB — TSH: TSH: 0.828 u[IU]/mL (ref 0.350–4.500)

## 2016-12-25 LAB — TROPONIN I: Troponin I: <0.03 ng/mL (ref <0.03)

## 2016-12-25 LAB — PHENYTOIN LEVEL, TOTAL: Phenytoin Lvl: <2.5 ug/mL — ABNORMAL LOW (ref 10.0–20.0)

## 2016-12-25 MED ORDER — METOPROLOL TARTRATE 5 MG/5ML IV SOLN
5.0000 mg | Freq: Once | INTRAVENOUS | Status: DC
  Filled 2016-12-25: qty 5

## 2016-12-25 MED ORDER — MORPHINE SULFATE (PF) 4 MG/ML IV SOLN
2.0000 mg | INTRAVENOUS | Status: DC | PRN
  Administered 2016-12-26 – 2016-12-27 (×4): 2 mg via INTRAVENOUS
  Filled 2016-12-25 (×4): qty 1

## 2016-12-25 MED ORDER — HEPARIN (PORCINE) IN NACL 100-0.45 UNIT/ML-% IJ SOLN
650.0000 [IU]/h | INTRAMUSCULAR | Status: DC
  Administered 2016-12-25: 650 [IU]/h via INTRAVENOUS
  Filled 2016-12-25: qty 250

## 2016-12-25 MED ORDER — AMIODARONE HCL IN DEXTROSE 360-4.14 MG/200ML-% IV SOLN: 30.0000 mg/h | INTRAVENOUS | Status: DC

## 2016-12-25 MED ORDER — LORAZEPAM 2 MG/ML IJ SOLN
1.0000 mg | INTRAMUSCULAR | Status: DC | PRN
Start: 2016-12-25 — End: 2016-12-29
  Administered 2016-12-26: 1 mg via INTRAVENOUS
  Filled 2016-12-25: qty 1

## 2016-12-25 MED ORDER — NITROGLYCERIN 0.4 MG SL SUBL: 0.4000 mg | SUBLINGUAL_TABLET | SUBLINGUAL | Status: DC | PRN

## 2016-12-25 MED ORDER — ASPIRIN 325 MG PO TABS
325.0000 mg | ORAL_TABLET | Freq: Every day | ORAL | Status: DC
  Administered 2016-12-25 – 2016-12-26 (×2): 325 mg via ORAL
  Filled 2016-12-25 (×2): qty 1

## 2016-12-25 MED ORDER — ACETAMINOPHEN 325 MG PO TABS
650.0000 mg | ORAL_TABLET | ORAL | Status: DC | PRN
  Administered 2016-12-28 (×2): 650 mg via ORAL
  Filled 2016-12-25 (×2): qty 2

## 2016-12-25 MED ORDER — DILTIAZEM HCL-DEXTROSE 100-5 MG/100ML-% IV SOLN (PREMIX)
5.0000 mg/h | INTRAVENOUS | Status: DC
  Administered 2016-12-25: 5 mg/h via INTRAVENOUS
  Filled 2016-12-25: qty 100

## 2016-12-25 MED ORDER — SODIUM CHLORIDE 0.9 % IV SOLN
INTRAVENOUS | Status: DC
  Administered 2016-12-25 – 2016-12-27 (×2): via INTRAVENOUS

## 2016-12-25 MED ORDER — ZOLPIDEM TARTRATE 5 MG PO TABS: 5.0000 mg | ORAL_TABLET | Freq: Every evening | ORAL | Status: DC | PRN

## 2016-12-25 MED ORDER — PHENYTOIN SODIUM EXTENDED 100 MG PO CAPS
300.0000 mg | ORAL_CAPSULE | Freq: Every day | ORAL | Status: DC
  Administered 2016-12-25 – 2016-12-28 (×4): 300 mg via ORAL
  Filled 2016-12-25 (×4): qty 3

## 2016-12-25 MED ORDER — MAGNESIUM SULFATE IN D5W 1-5 GM/100ML-% IV SOLN
1.0000 g | Freq: Once | INTRAVENOUS | Status: AC
  Administered 2016-12-25: 1 g via INTRAVENOUS
  Filled 2016-12-25: qty 100

## 2016-12-25 MED ORDER — DILTIAZEM HCL 25 MG/5ML IV SOLN
10.0000 mg | Freq: Once | INTRAVENOUS | Status: DC
  Filled 2016-12-25: qty 5

## 2016-12-25 MED ORDER — SODIUM CHLORIDE 0.9 % IV BOLUS (SEPSIS)
500.0000 mL | Freq: Once | INTRAVENOUS | Status: AC
  Administered 2016-12-25: 500 mL via INTRAVENOUS

## 2016-12-25 MED ORDER — AMIODARONE HCL IN DEXTROSE 360-4.14 MG/200ML-% IV SOLN
60.0000 mg/h | INTRAVENOUS | Status: DC
  Administered 2016-12-26 (×2): 60 mg/h via INTRAVENOUS
  Filled 2016-12-25 (×2): qty 200

## 2016-12-25 MED ORDER — HYDROXYZINE HCL 50 MG/ML IM SOLN
25.0000 mg | Freq: Four times a day (QID) | INTRAMUSCULAR | Status: DC | PRN
  Filled 2016-12-25: qty 0.5

## 2016-12-25 MED ORDER — SODIUM CHLORIDE 0.9 % IV BOLUS (SEPSIS)
1000.0000 mL | Freq: Once | INTRAVENOUS | Status: AC
  Administered 2016-12-25: 1000 mL via INTRAVENOUS

## 2016-12-25 MED ORDER — AMIODARONE LOAD VIA INFUSION
150.0000 mg | Freq: Once | INTRAVENOUS | Status: AC
  Administered 2016-12-26: 150 mg via INTRAVENOUS
  Filled 2016-12-25: qty 83.34

## 2016-12-25 MED ORDER — SODIUM CHLORIDE 0.9 % IV BOLUS (SEPSIS): 1000.0000 mL | Freq: Once | INTRAVENOUS | Status: DC

## 2016-12-25 MED ORDER — DILTIAZEM LOAD VIA INFUSION
10.0000 mg | Freq: Once | INTRAVENOUS | Status: AC
  Administered 2016-12-25: 10 mg via INTRAVENOUS
  Filled 2016-12-25: qty 10

## 2016-12-25 MED ORDER — HEPARIN BOLUS VIA INFUSION
2000.0000 [IU] | Freq: Once | INTRAVENOUS | Status: AC
  Administered 2016-12-25: 2000 [IU] via INTRAVENOUS
  Filled 2016-12-25: qty 2000

## 2016-12-25 NOTE — ED Provider Notes (Signed)
Emergency Department Provider Note   I have reviewed the triage vital signs and the nursing notes.   HISTORY  Chief Complaint Chest Pain   HPI Sherri Ramirez is a 61 y.o. female with PMH of MS and seizure presents to the emergency department for evaluation of breathing with some associated nausea.  Symptoms began yesterday.  She denies any lightheadedness, cough, URI symptoms.  Denies any fevers or chills.  No new medications.  Today when symptoms continued she presented to the emergency department was found to have a very high heart rate.  She denies any history of atrial flutter or fibrillation.  She is not anticoagulated.  No sudden worsening symptoms today.   Past Medical History:  Diagnosis Date  . MS (multiple sclerosis) (HCC)   . Seizure [facility])     Patient Active Problem List   Diagnosis Date Noted  . Atrial flutter with rapid ventricular response (HCC) 12/25/2016  . New onset atrial flutter (HCC) 12/25/2016  . Chest pain 12/25/2016  . Encounter for therapeutic drug monitoring 09/21/2015  . Multiple sclerosis (HCC) 03/14/2006  . SINUSITIS, CHRONIC NOS 03/14/2006  . LYMPHADENOPATHY 03/14/2006  . Seizure disorder, complex partial (HCC) 03/14/2006  . HYSTERECTOMY, HX OF 03/14/2006    Past Surgical History:  Procedure Laterality Date  . ABDOMINAL HYSTERECTOMY    . HYSTEROTOMY      Current Outpatient Rx  . Order #: [id number] Class: Normal  . Order #: [id number] Class: Normal    Allergies Carbamazepine and Glatiramer acetate  Family History  Problem Relation Age of Onset  . Colon cancer Mother   . Heart attack Father     Social History Social History   Tobacco Use  . Smoking status: Former Games developer  . Smokeless tobacco: Never Used  Substance Use Topics  . Alcohol use: No  . Drug use: No    Review of Systems  Constitutional: No fever/chills Eyes: No visual changes. ENT: No sore throat. Cardiovascular: Positive chest pain. Respiratory: Positive  shortness of breath. Gastrointestinal: No abdominal pain.  No nausea, no vomiting.  No diarrhea.  No constipation. Genitourinary: Negative for dysuria. Musculoskeletal: Negative for back pain. Skin: Negative for rash. Neurological: Negative for headaches, focal weakness or numbness.  10-point ROS otherwise negative.  ____________________________________________   PHYSICAL EXAM:  VITAL SIGNS: ED Triage Vitals  Enc Vitals Group     BP 12/25/16 1728 117/85     Pulse Rate 12/25/16 1738 (!) 163     Resp 12/25/16 1728 16     Temp 12/25/16 1728 98.5 F (36.9 C)     Temp Source 12/25/16 1728 Oral     SpO2 12/25/16 1738 92 %     Pain Score 12/25/16 1728 5   Constitutional: Alert and oriented. Well appearing and in no acute distress. Eyes: Conjunctivae are normal. Head: Atraumatic. Nose: No congestion/rhinnorhea. Mouth/Throat: Mucous membranes are moist.  Oropharynx non-erythematous. Neck: No stridor.   Cardiovascular: A-flutter rate 160s Good peripheral circulation. Grossly normal heart sounds.   Respiratory: Normal respiratory effort.  No retractions. Lungs CTAB. Gastrointestinal: Soft and nontender. No distention.  Musculoskeletal: No lower extremity tenderness nor edema. No gross deformities of extremities. Neurologic:  Normal speech and language. No gross focal neurologic deficits are appreciated.  Skin:  Skin is warm, dry and intact. No rash noted.  ____________________________________________   LABS (all labs ordered are listed, but only abnormal results are displayed)  Labs Reviewed  COMPREHENSIVE METABOLIC PANEL - Abnormal; Notable for the following components:  Result Value   Glucose, Bld 129 (*)    BUN 26 (*)    Total Protein 8.6 (*)    AST 45 (*)    All other components within normal limits  LIPASE, BLOOD  CBC WITH DIFFERENTIAL/PLATELET  TSH  HEPARIN LEVEL (UNFRACTIONATED)  CBC  RAPID URINE DRUG SCREEN, HOSP PERFORMED  HEMOGLOBIN A1C  LIPID PANEL    TROPONIN I  TROPONIN I  TROPONIN I  T4, FREE  T3, FREE  MAGNESIUM  BRAIN NATRIURETIC PEPTIDE  PHENYTOIN LEVEL, TOTAL  HIV ANTIBODY (ROUTINE TESTING)  BASIC METABOLIC PANEL  I-STAT TROPONIN, ED  I-STAT BETA HCG BLOOD, ED ([facility], [facility], [facility] ONLY)   ____________________________________________  EKG   EKG Interpretation  Date/Time:  Monday December 25 2016 17:35:43 EST Ventricular Rate:  161 PR Interval:    QRS Duration: 170 QT Interval:  359 QTC Calculation: 588 R Axis:   -67 Text Interpretation:  A-flutter Baseline wander in lead(s) I II III aVL aVF V1 V2 V4 V5 V6 No STEMI. No old tracing for comparison.  Confirmed by Alona BeneLong, Joshua [phone] on 12/25/2016 6:04:27 PM       ____________________________________________  RADIOLOGY  Dg Chest 2 View  Result Date: 12/25/2016 CLINICAL DATA:  Chest pain. EXAM: CHEST  2 VIEW COMPARISON:  None available currently. FINDINGS: The heart size and mediastinal contours are within normal limits. No pneumothorax is noted. Minimal right basilar subsegmental atelectasis is noted with minimal right pleural effusion. Mild left basilar subsegmental atelectasis is noted with mild left pleural effusion. The visualized skeletal structures are unremarkable. IMPRESSION: Minimal to mild bibasilar subsegmental atelectasis and pleural effusions. Electronically Signed   By: Lupita RaiderJames  Green Jr, M.D.   On: 12/25/2016 18:21    ____________________________________________   PROCEDURES  Procedure(s) performed:   Procedures  CRITICAL CARE Performed by: Maia PlanJoshua G Long Total critical care time: 45 minutes Critical care time was exclusive of separately billable procedures and treating other patients. Critical care was necessary to treat or prevent imminent or life-threatening deterioration. Critical care was time spent personally by me on the following activities: development of treatment plan with patient and/or surrogate as well as nursing, discussions with  consultants, evaluation of patient's response to treatment, examination of patient, obtaining history from patient or surrogate, ordering and performing treatments and interventions, ordering and review of laboratory studies, ordering and review of radiographic studies, pulse oximetry and re-evaluation of patient's condition.  Alona BeneJoshua Long, MD Emergency Medicine  ____________________________________________   INITIAL IMPRESSION / ASSESSMENT AND PLAN / ED COURSE  Pertinent labs & imaging results that were available during my care of the patient were reviewed by me and considered in my medical decision making (see chart for details).  Patient presents to the emergency department for evaluation of chest pain and dyspnea.  She is found to have significant tachycardia on arrival.  Rhythm appears to be atrial flutter.  No obvious inciting event.  Plan for IV fluids and discussion with cardiology regarding cardioversion versus admission.   06:19 PM Spoke with Cardiology who recommends admission to [facility], rate control, and anticoagulation. [facility]. Diltiazem infusion started along with Heparin.   Discussed patient's case with Hospitalist, Dr. Clyde LundborgNiu to request admission. Patient and family (if present) updated with plan. Care transferred to Hospitalist service.  I reviewed all nursing notes, vitals, pertinent old records, EKGs, labs, imaging (as available).  ____________________________________________  FINAL CLINICAL IMPRESSION(S) / ED DIAGNOSES  Final diagnoses:  Typical atrial flutter (HCC)  Precordial chest pain  SOB (shortness of breath)     MEDICATIONS GIVEN DURING THIS VISIT:  Medications  diltiazem (CARDIZEM) 1 mg/mL load via infusion 10 mg (10 mg Intravenous Bolus from Bag 12/25/16 1828)    And  diltiazem (CARDIZEM) 100 mg in dextrose 5% (1 mg/mL) infusion (10 mg/hr Intravenous Rate/Dose Change 12/25/16 1911)  heparin bolus via infusion  2,000 Units (not administered)  heparin ADULT infusion 100 units/mL (25000 units/221mL sodium chloride 0.45%) (not administered)  phenytoin (DILANTIN) ER capsule 100 mg (not administered)  aspirin tablet 325 mg (not administered)  nitroGLYCERIN (NITROSTAT) SL tablet 0.4 mg (not administered)  morphine 2 MG/ML injection 2 mg (not administered)  0.9 %  sodium chloride infusion (not administered)  LORazepam (ATIVAN) injection 1 mg (not administered)  acetaminophen (TYLENOL) tablet 650 mg (not administered)  zolpidem (AMBIEN) tablet 5 mg (not administered)  hydrOXYzine (VISTARIL) injection 25 mg (not administered)  sodium chloride 0.9 % bolus 1,000 mL (1,000 mLs Intravenous New Bag/Given 12/25/16 1828)    Note:  This document was prepared using Dragon voice recognition software and may include unintentional dictation errors.  Alona Bene, MD Emergency Medicine    Long, Arlyss Repress, MD 12/25/16 [phone]

## 2016-12-25 NOTE — ED Notes (Signed)
ED TO INPATIENT HANDOFF REPORT  Name/Age/Gender Sherri Ramirez 61 y.o. female  Code Status    Code Status Orders  (From admission, onward)        Start     Ordered   12/25/16 1947  Full code  Continuous     12/25/16 1947    Code Status History    Date Active Date Inactive Code Status Order ID Comments User Context   This patient has a current code status but no historical code status.      Home/SNF/Other Home  Chief Complaint chest pain / SOB   Level of Care/Admitting Diagnosis ED Disposition    ED Disposition Condition [facility]: [place] [[id number]]  Level of Care: Stepdown [14]  Admit to SDU based on following criteria: Other see comments  Comments: a flutter with RVR  Diagnosis: Atrial flutter with rapid ventricular response [facility]) [175102]  Admitting Physician: Ivor Costa [[id number]]  Attending Physician: Ivor Costa [phone]  Estimated length of stay: past midnight tomorrow  Certification:: I certify this patient will need inpatient services for at least 2 midnights  PT Class (Do Not Modify): Inpatient [101]  PT Acc Code (Do Not Modify): Private [1]       Medical History Past Medical History:  Diagnosis Date  . MS (multiple sclerosis) ([place])   . Seizure ([place])     Allergies Allergies  Allergen Reactions  . Carbamazepine   . Glatiramer Acetate     REACTION: anaphylaxis    IV Location/Drains/Wounds Patient Lines/Drains/Airways Status   Active Line/Drains/Airways    Name:   Placement date:   Placement time:   Site:   Days:   Peripheral IV 12/25/16 Left Antecubital   12/25/16    1751    Antecubital   less than 1   Peripheral IV 12/25/16 Right Antecubital   12/25/16    2014    Antecubital   less than 1          Labs/Imaging Results for orders placed or performed during the hospital encounter of 12/25/16 (from the past 48 hour(s))  Comprehensive metabolic panel     Status: Abnormal   Collection Time: 12/25/16   5:43 PM  Result Value Ref Range   Sodium 139 135 - 145 mmol/L   Potassium 4.1 3.5 - 5.1 mmol/L   Chloride 101 101 - 111 mmol/L   CO2 28 22 - 32 mmol/L   Glucose, Bld 129 (H) 65 - 99 mg/dL   BUN 26 (H) 6 - 20 mg/dL   Creatinine, Ser 0.77 0.44 - 1.00 mg/dL   Calcium 9.6 8.9 - 10.3 mg/dL   Total Protein 8.6 (H) 6.5 - 8.1 g/dL   Albumin 3.7 3.5 - 5.0 g/dL   AST 45 (H) 15 - 41 U/L   ALT 32 14 - 54 U/L   Alkaline Phosphatase 95 38 - 126 U/L   Total Bilirubin 0.9 0.3 - 1.2 mg/dL   GFR calc non Af Amer >60 >60 mL/min   GFR calc Af Amer >60 >60 mL/min    Comment: (NOTE) The eGFR has been calculated using the CKD EPI equation. This calculation has not been validated in all clinical situations. eGFR's persistently <60 mL/min signify possible Chronic Kidney Disease.    Anion gap 10 5 - 15  Lipase, blood     Status: None   Collection Time: 12/25/16  5:43 PM  Result Value Ref Range   Lipase 24 11 - 51  U/L  CBC with Differential     Status: None   Collection Time: 12/25/16  5:43 PM  Result Value Ref Range   WBC 8.8 4.0 - 10.5 K/uL   RBC 4.72 3.87 - 5.11 MIL/uL   Hemoglobin 13.0 12.0 - 15.0 g/dL   HCT 39.2 36.0 - 46.0 %   MCV 83.1 78.0 - 100.0 fL   MCH 27.5 26.0 - 34.0 pg   MCHC 33.2 30.0 - 36.0 g/dL   RDW 14.1 11.5 - 15.5 %   Platelets 266 150 - 400 K/uL   Neutrophils Relative % 79 %   Neutro Abs 6.9 1.7 - 7.7 K/uL   Lymphocytes Relative 10 %   Lymphs Abs 0.9 0.7 - 4.0 K/uL   Monocytes Relative 11 %   Monocytes Absolute 1.0 0.1 - 1.0 K/uL   Eosinophils Relative 0 %   Eosinophils Absolute 0.0 0.0 - 0.7 K/uL   Basophils Relative 0 %   Basophils Absolute 0.0 0.0 - 0.1 K/uL  TSH     Status: None   Collection Time: 12/25/16  5:45 PM  Result Value Ref Range   TSH 0.828 0.350 - 4.500 uIU/mL    Comment: Performed by a 3rd Generation assay with a functional sensitivity of <=0.01 uIU/mL.  I-stat troponin, ED     Status: None   Collection Time: 12/25/16  5:55 PM  Result Value Ref  Range   Troponin i, poc 0.00 0.00 - 0.08 ng/mL   Comment 3            Comment: Due to the release kinetics of cTnI, a negative result within the first hours of the onset of symptoms does not rule out myocardial infarction with certainty. If myocardial infarction is still suspected, repeat the test at appropriate intervals.   I-Stat beta hCG blood, ED     Status: None   Collection Time: 12/25/16  5:56 PM  Result Value Ref Range   I-stat hCG, quantitative <5.0 <5 mIU/mL   Comment 3            Comment:   GEST. AGE      CONC.  (mIU/mL)   <=1 WEEK        5 - 50     2 WEEKS       50 - 500     3 WEEKS       100 - 10,000     4 WEEKS     1,000 - 30,000        FEMALE AND NON-PREGNANT FEMALE:     LESS THAN 5 mIU/mL   Troponin I (q 6hr x 3)     Status: None   Collection Time: 12/25/16  8:15 PM  Result Value Ref Range   Troponin I <0.03 <0.03 ng/mL  Magnesium     Status: None   Collection Time: 12/25/16  8:15 PM  Result Value Ref Range   Magnesium 1.8 1.7 - 2.4 mg/dL  Phenytoin level, total     Status: Abnormal   Collection Time: 12/25/16  8:32 PM  Result Value Ref Range   Phenytoin Lvl <2.5 (L) 10.0 - 20.0 ug/mL   Dg Chest 2 View  Result Date: 12/25/2016 CLINICAL DATA:  Chest pain. EXAM: CHEST  2 VIEW COMPARISON:  None available currently. FINDINGS: The heart size and mediastinal contours are within normal limits. No pneumothorax is noted. Minimal right basilar subsegmental atelectasis is noted with minimal right pleural effusion. Mild left basilar subsegmental atelectasis is noted  with mild left pleural effusion. The visualized skeletal structures are unremarkable. IMPRESSION: Minimal to mild bibasilar subsegmental atelectasis and pleural effusions. Electronically Signed   By: Marijo Conception, M.D.   On: 12/25/2016 18:21    Pending Labs Unresulted Labs (From admission, onward)   Start     Ordered   12/27/16 0500  Heparin level (unfractionated)  Daily,   R     12/25/16 1850    12/26/16 0500  Hemoglobin A1c  Tomorrow morning,   R     12/25/16 1943   12/26/16 0500  Lipid panel  Tomorrow morning,   R    Comments:  Please obtain as a fasting lipid panel - should not have eaten/ drank food for 8 hours prior to labs.    12/25/16 1943   12/26/16 0500  Brain natriuretic peptide  Tomorrow morning,   R     12/25/16 1945   12/26/16 7824  Basic metabolic panel  Tomorrow morning,   R     12/25/16 1947   12/26/16 0400  Heparin level (unfractionated)  Once-Timed,   R     12/25/16 1844   12/26/16 0000  CBC  Daily,   R     12/25/16 1844   12/25/16 1947  HIV antibody (Routine Testing)  Once,   R     12/25/16 1947   12/25/16 1945  T4, free  Once,   R     12/25/16 1944   12/25/16 1945  T3, free  Once,   R     12/25/16 1944   12/25/16 1944  Rapid urine drug screen (hospital performed)  Once,   R     12/25/16 1943   12/25/16 1944  Troponin I (q 6hr x 3)  Now then every 6 hours,   R     12/25/16 1943      Vitals/Pain Today's Vitals   12/25/16 2030 12/25/16 2145 12/25/16 2200 12/25/16 2215  BP:  109/73 101/76 121/87  Pulse: (!) 164 (!) 162 (!) 162 60  Resp: (!) 27 (!) 33 (!) 23 (!) 31  Temp:      TempSrc:      SpO2: 99% 100% 100% 95%  Weight:      Height:      PainSc:        Isolation Precautions No active isolations  Medications Medications  diltiazem (CARDIZEM) 1 mg/mL load via infusion 10 mg (10 mg Intravenous Bolus from Bag 12/25/16 1828)    And  diltiazem (CARDIZEM) 100 mg in dextrose 5% 162m (1 mg/mL) infusion (10 mg/hr Intravenous Rate/Dose Change 12/25/16 1911)  heparin ADULT infusion 100 units/mL (25000 units/2512msodium chloride 0.45%) (650 Units/hr Intravenous New Bag/Given 12/25/16 2023)  phenytoin (DILANTIN) ER capsule 100 mg (not administered)  aspirin tablet 325 mg (not administered)  nitroGLYCERIN (NITROSTAT) SL tablet 0.4 mg (not administered)  morphine 4 MG/ML injection 2 mg (not administered)  0.9 %  sodium chloride infusion (not  administered)  LORazepam (ATIVAN) injection 1 mg (not administered)  acetaminophen (TYLENOL) tablet 650 mg (not administered)  zolpidem (AMBIEN) tablet 5 mg (not administered)  hydrOXYzine (VISTARIL) injection 25 mg (not administered)  sodium chloride 0.9 % bolus 500 mL (not administered)  magnesium sulfate IVPB 1 g 100 mL (not administered)  amiodarone (NEXTERONE) 1.8 mg/mL load via infusion 150 mg (not administered)    Followed by  amiodarone (NEXTERONE PREMIX) 360-4.14 MG/200ML-% (1.8 mg/mL) IV infusion (not administered)    Followed by  amiodarone (NEXTERONE PREMIX) 360-4.14  MG/200ML-% (1.8 mg/mL) IV infusion (not administered)  sodium chloride 0.9 % bolus 1,000 mL (1,000 mLs Intravenous New Bag/Given 12/25/16 1828)  heparin bolus via infusion 2,000 Units (2,000 Units Intravenous Bolus from Bag 12/25/16 2022)    Mobility walks

## 2016-12-25 NOTE — Progress Notes (Signed)
ANTICOAGULATION CONSULT NOTE - Follow Up Consult  Pharmacy Consult for heparin Indication: atrial fibrillation  Allergies  Allergen Reactions  . Carbamazepine   . Glatiramer Acetate     REACTION: anaphylaxis    Patient Measurements:   Heparin Dosing Weight:   Vital Signs: Temp: 98.5 F (36.9 C) (11/26 1728) Temp Source: Oral (11/26 1728) BP: 112/87 (11/26 1750) Pulse Rate: 158 (11/26 1750)  Labs: Recent Labs    12/25/16 1743  HGB 13.0  HCT 39.2  PLT 266    CrCl cannot be calculated (Patient's most recent lab result is older than the maximum 21 days allowed.).  Assessment: 63 YOF presents with chest pain, shortness of breath. EKG consistent with aflutter with RVR.   Pharmacy asked to dose heparin for atrial flutter.  She is not on anticoagulation  Today, 12/25/2016  CBC: Hgb and pltc WNL  Recent weight (august) was 46kg  Goal of Therapy:  Heparin level 0.3-0.7 units/ml Monitor platelets by anticoagulation protocol: Yes   Plan:   Heparin 2000 unit bolus then 650 units/hr  Check heparin level about 6h after start of infusion  Daily heparin level and CBC  Await plan for long-term anticoagulation  Juliette Alcide, PharmD, BCPS.   Pager: [phone] 12/25/2016 6:43 PM

## 2016-12-25 NOTE — ED Triage Notes (Signed)
Pt reports she began to have CP, SOB and nausea since yesterday. Denies lightheadedness, cough or URI symptoms.

## 2016-12-25 NOTE — ED Notes (Signed)
Pt aware we need a urine sample. 

## 2016-12-25 NOTE — H&P (Addendum)
History and Physical    Sherri StaplerLynn F Otter JXB:[id number] DOB: November 07, 1955 DOA: 12/25/2016  Referring MD/NP/PA:   PCP: Andi DevonShelton, [name], MD   Patient coming from:  The patient is coming from home.  At baseline, pt is independent for most of ADL.   Chief Complaint: chest pain,  shortness shortness breath and palpitation  HPI: Sherri Ramirez is a 61 y.o. female with medical history significant of MS,  Seizure, who presents with chest pain, shortness of breath and palpitation.  Patient states that she has been having intermittent chest pain since yesterday. The chest pain is located in the left side of chest, 5 out of 10 in severity, nonradiating, dull, not pleuritic, not aggravated by deep breath. Her chest pain has resolved currently. It is associated with mild shortness of breath and mild palpitation which have resolved. No tenderness in the calf areas. No cough, fever or chills. Patient had nausea, but no vomiting, diarrhea or abdominal pain. Denies symptoms of UTI or unilateral weakness.  ED Course: pt was found to have negative troponin, WBC 8.8, lipase 24, electrolytes renal function okay, temperature normal, heart rate up to 160s, tachypnea, oxygen saturation 90-200% on room air. Chest x-ray has no infiltration, but showed up mild bilateral basilar atelectasis and minimal pleural effusion. Patient is admitted to stepdown as inpatient. Cardiology was consulted.  Review of Systems:   General: no fevers, chills, no body weight gain, Has fatigue HEENT: no blurry vision, hearing changes or sore throat Respiratory: has dyspnea, no coughing, wheezing CV: has chest pain and palpitations GI: has nausea, no vomiting, abdominal pain, diarrhea, constipation GU: no dysuria, burning on urination, increased urinary frequency, hematuria  Ext: no leg edema Neuro: no unilateral weakness, numbness, or tingling, no vision change or hearing loss Skin: no rash, no skin tear. MSK: No muscle spasm, no deformity,  no limitation of range of movement in spin Heme: No easy bruising.  Travel history: No recent long distant travel.  Allergy:  Allergies  Allergen Reactions  . Carbamazepine   . Glatiramer Acetate     REACTION: anaphylaxis    Past Medical History:  Diagnosis Date  . MS (multiple sclerosis) (HCC)   . Seizure [facility](HCC)     Past Surgical History:  Procedure Laterality Date  . ABDOMINAL HYSTERECTOMY    . HYSTEROTOMY      Social History:  reports that she has quit smoking. she has never used smokeless tobacco. She reports that she does not drink alcohol or use drugs.  Family History:  Family History  Problem Relation Age of Onset  . Colon cancer Mother   . Heart attack Father      Prior to Admission medications   Medication Sig Start Date End Date Taking? Authorizing Provider  Interferon Beta-1a (REBIF REBIDOSE) 44 MCG/0.5ML SOAJ Inject 44 mcg into the skin 3 (three) times a week. 09/27/16  Yes Nilda RiggsMartin, Nancy Carolyn, NP  phenytoin (DILANTIN) 100 MG ER capsule TAKE 3 CAPSULES EVERY EVENING 09/28/16  Yes Nilda RiggsMartin, Nancy Carolyn, NP    Physical Exam: Vitals:   12/25/16 1925 12/25/16 1930 12/25/16 1943 12/25/16 2000  BP: 105/84 111/82 111/82 (!) 127/93  Pulse: (!) 159  (!) 160 (!) 156  Resp: 19 (!) 23 (!) 23 20  Temp:      TempSrc:      SpO2: 100%  100% 100%  Weight:      Height:       General: Not in acute distress HEENT:  Eyes: PERRL, EOMI, no scleral icterus.       ENT: No discharge from the ears and nose, no pharynx injection, no tonsillar enlargement.        Neck: No JVD, no bruit, no mass felt. Heme: No neck lymph node enlargement. Cardiac: S1/S2, RRR, tachycardia, No murmurs, No gallops or rubs. Respiratory:  No rales, wheezing, rhonchi or rubs. GI: Soft, nondistended, nontender, no rebound pain, no organomegaly, BS present. GU: No hematuria Ext: No pitting leg edema bilaterally. 2+DP/PT pulse bilaterally. Musculoskeletal: No joint deformities, No joint redness  or warmth, no limitation of ROM in spin. Skin: No rashes.  Neuro: Alert, oriented X3, cranial nerves II-XII grossly intact, moves all extremities. Psych: Patient is not psychotic, no suicidal or hemocidal ideation.  Labs on Admission: I have personally reviewed following labs and imaging studies  CBC: Recent Labs  Lab 12/25/16 1743  WBC 8.8  NEUTROABS 6.9  HGB 13.0  HCT 39.2  MCV 83.1  PLT 266   Basic Metabolic Panel: Recent Labs  Lab 12/25/16 1743  NA 139  K 4.1  CL 101  CO2 28  GLUCOSE 129*  BUN 26*  CREATININE 0.77  CALCIUM 9.6   GFR: Estimated Creatinine Clearance: 50.9 mL/min (by C-G formula based on SCr of 0.77 mg/dL). Liver Function Tests: Recent Labs  Lab 12/25/16 1743  AST 45*  ALT 32  ALKPHOS 95  BILITOT 0.9  PROT 8.6*  ALBUMIN 3.7   Recent Labs  Lab 12/25/16 1743  LIPASE 24   No results for input(s): AMMONIA in the last 168 hours. Coagulation Profile: No results for input(s): INR, PROTIME in the last 168 hours. Cardiac Enzymes: No results for input(s): CKTOTAL, CKMB, CKMBINDEX, TROPONINI in the last 168 hours. BNP (last 3 results) No results for input(s): PROBNP in the last 8760 hours. HbA1C: No results for input(s): HGBA1C in the last 72 hours. CBG: No results for input(s): GLUCAP in the last 168 hours. Lipid Profile: No results for input(s): CHOL, HDL, LDLCALC, TRIG, CHOLHDL, LDLDIRECT in the last 72 hours. Thyroid Function Tests: Recent Labs    12/25/16 1745  TSH 0.828   Anemia Panel: No results for input(s): VITAMINB12, FOLATE, FERRITIN, TIBC, IRON, RETICCTPCT in the last 72 hours. Urine analysis: No results found for: COLORURINE, APPEARANCEUR, LABSPEC, PHURINE, GLUCOSEU, HGBUR, BILIRUBINUR, KETONESUR, PROTEINUR, UROBILINOGEN, NITRITE, LEUKOCYTESUR Sepsis Labs: @LABRCNTIP (procalcitonin:4,lacticidven:4) )No results found for this or any previous visit (from the past 240 hour(s)).   Radiological Exams on Admission: Dg Chest 2  View  Result Date: 12/25/2016 CLINICAL DATA:  Chest pain. EXAM: CHEST  2 VIEW COMPARISON:  None available currently. FINDINGS: The heart size and mediastinal contours are within normal limits. No pneumothorax is noted. Minimal right basilar subsegmental atelectasis is noted with minimal right pleural effusion. Mild left basilar subsegmental atelectasis is noted with mild left pleural effusion. The visualized skeletal structures are unremarkable. IMPRESSION: Minimal to mild bibasilar subsegmental atelectasis and pleural effusions. Electronically Signed   By: Lupita Raider, M.D.   On: 12/25/2016 18:21     EKG: Independently reviewed.  Atrial flutter with RVR, QTC 588,  Assessment/Plan Principal Problem:   Atrial flutter with rapid ventricular response (HCC) Active Problems:   Multiple sclerosis (HCC)   Seizure disorder, complex partial (HCC)   New onset atrial flutter (HCC)   Chest pain   New onset atrial flutter with rapid ventricular response and chest pain: Patient's heart rate is up to 160s. Triggering factors is not clear. Patient does not have  fever or leukocytosis, does not seem to have infection. She has chest pain, will need to r/o ACD. Initial trop negative. EDP "spoke with Cardiology who recommends admission to [facility], rate control, and anticoagulation. They will see in consultation at [facility]. Diltiazem infusion started along with Heparin". TSH is 0.828. Her chads score is 1-->will not need long time anticoagulant.    - will admit to SDU as inpt - on IV cardizem - on IV heparin gtt - cycle CE q6 x3 and repeat EKG in the am  - prn Nitroglycerin, Morphine, and aspirin - Risk factor stratification: will check FLP, UDS, and A1C, free T4 and T3  - 2d echo - Follow up with cardiologist recommendation -inpt card consult was ordered  Addendum: pt's HR has been persistently at 160s. No CP or SOB. I called on-call cardiologist, Dr. Shirlee Latch, who recommended to start amiodarone.  Since there is no stepdown bed available on [facility], per Dr. Shirlee Latch, it is okay to admit patient to stepdown in [facility].  Multiple sclerosis: pt is receiving Rebif Rebidose injection 3/week. Last dose was 12/20/16 -will hold Rebif Rebidose now  Seizure disorder, complex partial (HCC) - Seizure precaution -Continue phenytoin -Check phenytoin level -When necessary Ativan for seizure  Addendum: Phenytoin level is subtherapeutic, 2.5 -will give one dose of 250 mg of IV phenytoin now.    DVT ppx: on IV heparin     Code Status: Full code Family Communication: Yes, patient's  Husband at bed side Disposition Plan:  Anticipate discharge back to previous home environment Consults called:  Cardiology, Dr. Jearld Pies Admission status:  SDU/inpation       Date of Service 12/25/2016    Lorretta Harp [facility] Pager [phone]  If 7PM-7AM, please contact night-coverage [email or web address] Password [email or web address] 12/25/2016, 8:01 PM

## 2016-12-26 ENCOUNTER — Inpatient Hospital Stay (HOSPITAL_COMMUNITY): Payer: Managed Care, Other (non HMO)

## 2016-12-26 ENCOUNTER — Other Ambulatory Visit: Payer: Self-pay

## 2016-12-26 DIAGNOSIS — I4892 Unspecified atrial flutter: Secondary | ICD-10-CM

## 2016-12-26 DIAGNOSIS — R0602 Shortness of breath: Secondary | ICD-10-CM

## 2016-12-26 DIAGNOSIS — R071 Chest pain on breathing: Secondary | ICD-10-CM

## 2016-12-26 DIAGNOSIS — I300 Acute nonspecific idiopathic pericarditis: Secondary | ICD-10-CM

## 2016-12-26 DIAGNOSIS — Z5309 Procedure and treatment not carried out because of other contraindication: Secondary | ICD-10-CM

## 2016-12-26 DIAGNOSIS — I483 Typical atrial flutter: Principal | ICD-10-CM

## 2016-12-26 DIAGNOSIS — I361 Nonrheumatic tricuspid (valve) insufficiency: Secondary | ICD-10-CM

## 2016-12-26 LAB — BASIC METABOLIC PANEL
Anion gap: 8 (ref 5–15)
BUN: 20 mg/dL (ref 6–20)
CO2: 23 mmol/L (ref 22–32)
Calcium: 8.2 mg/dL — ABNORMAL LOW (ref 8.9–10.3)
Chloride: 106 mmol/L (ref 101–111)
Creatinine, Ser: 0.64 mg/dL (ref 0.44–1.00)
GFR calc Af Amer: >60 mL/min (ref >60)
GFR calc non Af Amer: >60 mL/min (ref >60)
Glucose, Bld: 122 mg/dL — ABNORMAL HIGH (ref 65–99)
Potassium: 3.3 mmol/L — ABNORMAL LOW (ref 3.5–5.1)
Sodium: 137 mmol/L (ref 135–145)

## 2016-12-26 LAB — GLUCOSE, CAPILLARY: Glucose-Capillary: 108 mg/dL — ABNORMAL HIGH (ref 65–99)

## 2016-12-26 LAB — URINALYSIS, ROUTINE W REFLEX MICROSCOPIC
Bilirubin Urine: NEGATIVE
Glucose, UA: NEGATIVE mg/dL
Ketones, ur: 5 mg/dL — AB
Nitrite: NEGATIVE
Protein, ur: NEGATIVE mg/dL
Specific Gravity, Urine: 1.026 (ref 1.005–1.030)
pH: 5 (ref 5.0–8.0)

## 2016-12-26 LAB — HEPARIN LEVEL (UNFRACTIONATED)
Heparin Unfractionated: 0.69 IU/mL (ref 0.30–0.70)
Heparin Unfractionated: <0.1 IU/mL — ABNORMAL LOW (ref 0.30–0.70)
Heparin Unfractionated: <0.1 IU/mL — ABNORMAL LOW (ref 0.30–0.70)
Heparin Unfractionated: <0.1 IU/mL — ABNORMAL LOW (ref 0.30–0.70)

## 2016-12-26 LAB — RAPID URINE DRUG SCREEN, HOSP PERFORMED
Amphetamines: NOT DETECTED
Barbiturates: NOT DETECTED
Benzodiazepines: POSITIVE — AB
Cocaine: NOT DETECTED
Opiates: POSITIVE — AB
Tetrahydrocannabinol: NOT DETECTED

## 2016-12-26 LAB — LIPID PANEL
Cholesterol: 165 mg/dL (ref 0–200)
HDL: 43 mg/dL (ref >40)
LDL Cholesterol: 109 mg/dL — ABNORMAL HIGH (ref 0–99)
Total CHOL/HDL Ratio: 3.8 RATIO
Triglycerides: 67 mg/dL (ref <150)
VLDL: 13 mg/dL (ref 0–40)

## 2016-12-26 LAB — CBC
HCT: 31.3 % — ABNORMAL LOW (ref 36.0–46.0)
Hemoglobin: 10.2 g/dL — ABNORMAL LOW (ref 12.0–15.0)
MCH: 26.9 pg (ref 26.0–34.0)
MCHC: 32.6 g/dL (ref 30.0–36.0)
MCV: 82.6 fL (ref 78.0–100.0)
Platelets: 187 10*3/uL (ref 150–400)
RBC: 3.79 MIL/uL — ABNORMAL LOW (ref 3.87–5.11)
RDW: 14.6 % (ref 11.5–15.5)
WBC: 7.4 10*3/uL (ref 4.0–10.5)

## 2016-12-26 LAB — BRAIN NATRIURETIC PEPTIDE: B Natriuretic Peptide: 183.8 pg/mL — ABNORMAL HIGH (ref 0.0–100.0)

## 2016-12-26 LAB — FERRITIN: Ferritin: 605 ng/mL — ABNORMAL HIGH (ref 11–307)

## 2016-12-26 LAB — HIV ANTIBODY (ROUTINE TESTING W REFLEX): HIV Screen 4th Generation wRfx: NONREACTIVE

## 2016-12-26 LAB — MRSA PCR SCREENING: MRSA by PCR: NEGATIVE

## 2016-12-26 LAB — TROPONIN I
Troponin I: <0.03 ng/mL (ref <0.03)
Troponin I: <0.03 ng/mL (ref <0.03)

## 2016-12-26 LAB — T4, FREE: Free T4: 1.24 ng/dL — ABNORMAL HIGH (ref 0.61–1.12)

## 2016-12-26 LAB — RETICULOCYTES
RBC.: 3.59 MIL/uL — ABNORMAL LOW (ref 3.87–5.11)
Retic Count, Absolute: 32.3 10*3/uL (ref 19.0–186.0)
Retic Ct Pct: 0.9 % (ref 0.4–3.1)

## 2016-12-26 LAB — ECHOCARDIOGRAM COMPLETE
Height: 63 in
Weight: 1552.04 oz

## 2016-12-26 LAB — IRON AND TIBC
Iron: 19 ug/dL — ABNORMAL LOW (ref 28–170)
Saturation Ratios: 12 % (ref 10.4–31.8)
TIBC: 160 ug/dL — ABNORMAL LOW (ref 250–450)
UIBC: 141 ug/dL

## 2016-12-26 LAB — HEMOGLOBIN A1C
Hgb A1c MFr Bld: 5 % (ref 4.8–5.6)
Mean Plasma Glucose: 96.8 mg/dL

## 2016-12-26 LAB — FOLATE: Folate: 9.2 ng/mL (ref >5.9)

## 2016-12-26 LAB — VITAMIN B12: Vitamin B-12: 388 pg/mL (ref 180–914)

## 2016-12-26 MED ORDER — HEPARIN (PORCINE) IN NACL 100-0.45 UNIT/ML-% IJ SOLN
800.0000 [IU]/h | INTRAMUSCULAR | Status: DC
  Administered 2016-12-26: 800 [IU]/h via INTRAVENOUS

## 2016-12-26 MED ORDER — IBUPROFEN 200 MG PO TABS
400.0000 mg | ORAL_TABLET | Freq: Two times a day (BID) | ORAL | Status: DC
  Administered 2016-12-26 – 2016-12-29 (×7): 400 mg via ORAL
  Filled 2016-12-26 (×8): qty 2

## 2016-12-26 MED ORDER — POTASSIUM CHLORIDE CRYS ER 20 MEQ PO TBCR
40.0000 meq | EXTENDED_RELEASE_TABLET | Freq: Once | ORAL | Status: AC
Start: 2016-12-26 — End: 2016-12-26
  Administered 2016-12-26: 40 meq via ORAL
  Filled 2016-12-26: qty 2

## 2016-12-26 MED ORDER — HEPARIN (PORCINE) IN NACL 100-0.45 UNIT/ML-% IJ SOLN: 800.0000 [IU]/h | INTRAMUSCULAR | Status: DC

## 2016-12-26 MED ORDER — LIP MEDEX EX OINT
TOPICAL_OINTMENT | CUTANEOUS | Status: AC
  Administered 2016-12-26: 01:00:00
  Filled 2016-12-26: qty 7

## 2016-12-26 MED ORDER — COLCHICINE 0.6 MG PO TABS
0.6000 mg | ORAL_TABLET | Freq: Two times a day (BID) | ORAL | Status: DC
  Administered 2016-12-26 – 2016-12-29 (×6): 0.6 mg via ORAL
  Filled 2016-12-26 (×9): qty 1

## 2016-12-26 MED ORDER — HEPARIN BOLUS VIA INFUSION
1000.0000 [IU] | Freq: Once | INTRAVENOUS | Status: AC
  Administered 2016-12-26: 1000 [IU] via INTRAVENOUS
  Filled 2016-12-26: qty 1000

## 2016-12-26 MED ORDER — SODIUM CHLORIDE 0.9 % IV SOLN
250.0000 mg | Freq: Once | INTRAVENOUS | Status: AC
  Administered 2016-12-26: 250 mg via INTRAVENOUS
  Filled 2016-12-26: qty 5

## 2016-12-26 MED ORDER — ASPIRIN EC 81 MG PO TBEC
81.0000 mg | DELAYED_RELEASE_TABLET | Freq: Every day | ORAL | Status: DC
  Administered 2016-12-27 – 2016-12-29 (×3): 81 mg via ORAL
  Filled 2016-12-26 (×4): qty 1

## 2016-12-26 MED ORDER — POTASSIUM CHLORIDE CRYS ER 20 MEQ PO TBCR
40.0000 meq | EXTENDED_RELEASE_TABLET | Freq: Once | ORAL | Status: AC
  Administered 2016-12-26: 40 meq via ORAL
  Filled 2016-12-26: qty 2

## 2016-12-26 NOTE — Progress Notes (Signed)
ANTICOAGULATION CONSULT NOTE - Follow Up Consult  Pharmacy Consult for heparin Indication: atrial fibrillation  Allergies  Allergen Reactions  . Carbamazepine   . Glatiramer Acetate     REACTION: anaphylaxis    Patient Measurements: Height: 5\' 3"  (160 cm) Weight: 97 lb 0 oz (44 kg) IBW/kg (Calculated) : 52.4 Heparin Dosing Weight:   Vital Signs: Temp: 99.3 F (37.4 C) (11/27 0000) Temp Source: Oral (11/27 0000) BP: 108/66 (11/27 0300) Pulse Rate: 162 (11/26 2245)  Labs: Recent Labs    12/25/16 1743 12/25/16 2015 12/26/16 0234  HGB 13.0  --  10.2*  HCT 39.2  --  31.3*  PLT 266  --  187  HEPARINUNFRC  --   --  0.69  CREATININE 0.77  --  0.64  TROPONINI  --  <0.03 <0.03    Estimated Creatinine Clearance: 51.9 mL/min (by C-G formula based on SCr of 0.64 mg/dL).  Assessment: 81 YOF presents with chest pain, shortness of breath. EKG consistent with aflutter with RVR.   Pharmacy asked to dose heparin for atrial flutter.  She is not on anticoagulation   11/26  CBC: Hgb and pltc WNL  Recent weight (august) was 46kg Today, 11/27  0230 HL=0.69 units/ml at goal, no bleeding or infusion issues per RN  Goal of Therapy:  Heparin level 0.3-0.7 units/ml Monitor platelets by anticoagulation protocol: Yes   Plan:   Continue heparin drip at 650 units/hr  Recheck HL in 6 hours   Daily heparin level and CBC  Await plan for long-term anticoagulation   Lorenza Evangelist 12/26/2016 3:31 AM

## 2016-12-26 NOTE — Progress Notes (Signed)
PROGRESS NOTE    Sherri StaplerLynn F Wineland  ZOX:[id number] DOB: 1955/05/24 DOA: 12/25/2016 PCP: Andi DevonShelton, [name], MD    Brief Narrative: Sherri Ramirez is a 61 y.o. female with medical history significant of MS,  Seizure, who presents with chest pain, shortness of breath and palpitation. She was found to be in new onset afib with RVR with rate in 160's.  Chest x-ray has no infiltration, with mild bilateral basilar atelectasis and minimal pleural effusion. Patient is admitted to stepdown as inpatient. Cardiology was consulted.       Assessment & Plan:   Principal Problem:   Atrial flutter with rapid ventricular response (HCC) Active Problems:   Multiple sclerosis (HCC)   Seizure disorder, complex partial (HCC)   Typical atrial flutter (HCC)   Chest pain   Contraindication to anticoagulation therapy   Acute idiopathic pericarditis   SOB (shortness of breath)  New onset atrial flutter/fibrillation with RVR Patient converted to sinus overnight.  IV Cardizem and IV amiodarone stopped.  Cardiology consulted, recommendations to start the patient on aspirin 81 mg daily for anticoagulation.  Her chads score is 1. Currently she denies any chest pain shortness of breath or palpitations.  Follow TSH, free T4.  Echocardiogram reviewed.    Multiple sclerosis Stable   Seizure disorder: Phenytoin level subtherapeutic, 1 dose of IV phenytoin 250 mg given. Resume home meds.  No seizure activity the last 24 hours.  Seizure precautions.  Hypokalemia replaced.   Anemia: Normocytic.  Baseline hemoglobin around.  Anemia panel to be sent. Stool for occult blood .   Lower abdominal pain Unclear etiology get urine analysis.  If pain does not improve plan to get a KUB.  DVT prophylaxis: Lovenox Code Status: Full code Family Communication: None at bedside, discussed the plan of care with the patient Disposition Plan: Transfer to telemetry  Consultants:   Cardiology  Procedures:  Echocardiogram Antimicrobials: None  Subjective: Reports some mild lower abdominal pain.  No dysuria.  Objective: Vitals:   12/26/16 0500 12/26/16 0700 12/26/16 0800 12/26/16 1143  BP: (!) 82/52 (!) 79/47    Pulse:      Resp: (!) 21 18    Temp:   99.1 F (37.3 C) 99.2 F (37.3 C)  TempSrc:   Oral Oral  SpO2: 98% 99%    Weight:      Height:        Intake/Output Summary (Last 24 hours) at 12/26/2016 1358 Last data filed at 12/26/2016 0500 Gross per 24 hour  Intake 1449.92 ml  Output 300 ml  Net 1149.92 ml   Filed Weights   12/25/16 1907 12/26/16 0000  Weight: 43.1 kg (95 lb) 44 kg (97 lb 0 oz)    Examination:  General exam: Appears calm and comfortable  Respiratory system: Clear to auscultation. Respiratory effort normal. Cardiovascular system: S1 & S2 heard, RRR. No JVD, murmurs, rubs, gallops or clicks. No pedal edema. Gastrointestinal system: Abdomen is nondistended, soft and nontender. No organomegaly or masses felt. Normal bowel sounds heard. Central nervous system: Alert and oriented. No focal neurological deficits. Extremities: Symmetric 5 x 5 power. Skin: No rashes, lesions or ulcers Psychiatry:  Mood & affect appropriate.     Data Reviewed: I have personally reviewed following labs and imaging studies  CBC: Recent Labs  Lab 12/25/16 1743 12/26/16 0234  WBC 8.8 7.4  NEUTROABS 6.9  --   HGB 13.0 10.2*  HCT 39.2 31.3*  MCV 83.1 82.6  PLT 266 187   Basic Metabolic Panel:  Recent Labs  Lab 12/25/16 1743 12/25/16 2015 12/26/16 0234  NA 139  --  137  K 4.1  --  3.3*  CL 101  --  106  CO2 28  --  23  GLUCOSE 129*  --  122*  BUN 26*  --  20  CREATININE 0.77  --  0.64  CALCIUM 9.6  --  8.2*  MG  --  1.8  --    GFR: Estimated Creatinine Clearance: 51.9 mL/min (by C-G formula based on SCr of 0.64 mg/dL). Liver Function Tests: Recent Labs  Lab 12/25/16 1743  AST 45*  ALT 32  ALKPHOS 95  BILITOT 0.9  PROT 8.6*  ALBUMIN 3.7   Recent Labs   Lab 12/25/16 1743  LIPASE 24   No results for input(s): AMMONIA in the last 168 hours. Coagulation Profile: No results for input(s): INR, PROTIME in the last 168 hours. Cardiac Enzymes: Recent Labs  Lab 12/25/16 2015 12/26/16 0234 12/26/16 0744  TROPONINI <0.03 <0.03 <0.03   BNP (last 3 results) No results for input(s): PROBNP in the last 8760 hours. HbA1C: Recent Labs    12/26/16 0234  HGBA1C 5.0   CBG: Recent Labs  Lab 12/26/16 0749  GLUCAP 108*   Lipid Profile: Recent Labs    12/26/16 0234  CHOL 165  HDL 43  LDLCALC 109*  TRIG 67  CHOLHDL 3.8   Thyroid Function Tests: Recent Labs    12/25/16 1745 12/25/16 2032  TSH 0.828  --   FREET4  --  1.24*   Anemia Panel: No results for input(s): VITAMINB12, FOLATE, FERRITIN, TIBC, IRON, RETICCTPCT in the last 72 hours. Sepsis Labs: No results for input(s): PROCALCITON, LATICACIDVEN in the last 168 hours.  Recent Results (from the past 240 hour(s))  MRSA PCR Screening     Status: None   Collection Time: 12/26/16 12:30 AM  Result Value Ref Range Status   MRSA by PCR NEGATIVE NEGATIVE Final    Comment:        The GeneXpert MRSA Assay (FDA approved for NASAL specimens only), is one component of a comprehensive MRSA colonization surveillance program. It is not intended to diagnose MRSA infection nor to guide or monitor treatment for MRSA infections.          Radiology Studies: Dg Chest 2 View  Result Date: 12/25/2016 CLINICAL DATA:  Chest pain. EXAM: CHEST  2 VIEW COMPARISON:  None available currently. FINDINGS: The heart size and mediastinal contours are within normal limits. No pneumothorax is noted. Minimal right basilar subsegmental atelectasis is noted with minimal right pleural effusion. Mild left basilar subsegmental atelectasis is noted with mild left pleural effusion. The visualized skeletal structures are unremarkable. IMPRESSION: Minimal to mild bibasilar subsegmental atelectasis and  pleural effusions. Electronically Signed   By: Lupita Raider, M.D.   On: 12/25/2016 18:21        Scheduled Meds: . aspirin EC  81 mg Oral Daily  . colchicine  0.6 mg Oral BID  . ibuprofen  400 mg Oral BID  . phenytoin  300 mg Oral QHS  . potassium chloride  40 mEq Oral Once   Continuous Infusions: . sodium chloride 75 mL/hr at 12/25/16 2355     LOS: 1 day    Time spent: 35 minutes.     Kathlen Mody, MD [facility] Pager [phone]  If 7PM-7AM, please contact night-coverage [email or web address] Password [email or web address] 12/26/2016, 1:58 PM

## 2016-12-26 NOTE — Progress Notes (Signed)
ANTICOAGULATION CONSULT NOTE - Follow Up Consult  Pharmacy Consult for Heparin Indication: atrial fibrillation  Allergies  Allergen Reactions  . Carbamazepine   . Glatiramer Acetate     REACTION: anaphylaxis    Patient Measurements: Height: 5\' 3"  (160 cm) Weight: 97 lb 0 oz (44 kg) IBW/kg (Calculated) : 52.4 Heparin Dosing Weight: total weight, 44 kg  Vital Signs: Temp: 98.6 F (37 C) (11/27 0353) Temp Source: Oral (11/27 0353) BP: 82/52 (11/27 0500) Pulse Rate: 162 (11/26 2245)  Labs: Recent Labs    12/25/16 1743 12/25/16 2015 12/26/16 0234  HGB 13.0  --  10.2*  HCT 39.2  --  31.3*  PLT 266  --  187  HEPARINUNFRC  --   --  0.69  CREATININE 0.77  --  0.64  TROPONINI  --  <0.03 <0.03    Estimated Creatinine Clearance: 51.9 mL/min (by C-G formula based on SCr of 0.64 mg/dL).   Medications:  Scheduled:  . aspirin  325 mg Oral Daily  . phenytoin  300 mg Oral QHS   Infusions:  . sodium chloride 75 mL/hr at 12/25/16 2355  . heparin 650 Units/hr (12/26/16 0428)    Assessment: 61 yoF admitted on 11/26 with chest pain.  Pharmacy has been consulted to dose heparin for Afib.  Today, 12/26/2016: 07:44 Confirmatory Heparin level returned as < 0.1, despite previously therapeutic level of 0.69 at 02:34 this morning on same rate of heparin 650 units/hr. Rn confirms that IV site is functioning correctly, no IV interruptions, no complications noted. Due to heparin level not correlating with previous level, I ordered a re-draw of heparin level.  This recheck at 10:18 confirms heparin level < 0.1 CBC: Hgb decreased to 10.2, Plt 187 No bleeding or complications noted. SCr 0.64 with CrCl ~ 52 ml/min  Goal of Therapy:  Heparin level 0.3-0.7 units/ml Monitor platelets by anticoagulation protocol: Yes   Plan:  Give heparin 1000 units bolus IV x 1 Increase to heparin IV infusion at 800 units/hr Heparin level 6 hours after rate change Daily heparin level and CBC Follow up  long term anticoagulation plans.  Lynann Beaver PharmD, BCPS Pager [phone] 12/26/2016 7:10 AM

## 2016-12-26 NOTE — Care Management Note (Signed)
Case Management Note  Patient Details  Name: CHARLETT HINKLEY MRN: [id number] Date of Birth: [birth year]  Subjective/Objective:                  Chest pain  Action/Plan: Date: December 26, 2016 Marcelle Smiling, BSN, [place], [place]  [phone] Chart and notes review for patient progress and needs. Will follow for case management and discharge needs. Next review date: [id number]  Expected Discharge Date:  12/28/16               Expected Discharge Plan:  Home/Self Care  In-House Referral:     Discharge planning Services  CM Consult  Post Acute Care Choice:    Choice offered to:     DME Arranged:    DME Agency:     HH Arranged:    HH Agency:     Status of Service:  In process, will continue to follow  If discussed at Long Length of Stay Meetings, dates discussed:    Additional Comments:  Sahanna, [place], RN 12/26/2016, 8:10 AM

## 2016-12-26 NOTE — Progress Notes (Signed)
  Echocardiogram 2D Echocardiogram has been performed.  Leta Jungling M 12/26/2016, 9:50 AM

## 2016-12-26 NOTE — Progress Notes (Signed)
Patient transferred from ICU VSS, tele applied. Denies any needs at this time. Will continue to monitor.

## 2016-12-26 NOTE — Consult Note (Addendum)
Cardiology Consultation:   Patient ID: Sherri Ramirez; [id number]; 01-07-1956   Admit date: 12/25/2016 Date of Consult: 12/26/2016  Primary Care Provider: Andi Devon, MD Primary Cardiologist: New to Dr. Delton See    Patient Profile:   Sherri Ramirez is a 61 y.o. female with a hx of seizure and multiple sclerosis who is being seen today for the evaluation of atrial flutter at the request of Dr. Clyde Lundborg.  No prior cardiac history.  History of 15 pack year tobacco abuse.  Quit approximately 15 years ago.  Father died of MI at age 75.  History of Present Illness:   [name] was in usual state of health up until Sunday when she noted intermittent chest pain and shortness of breath.  Came in waves.  Not exacerbated by exertional activity.  She went to see PCP yesterday and noted elevated heart rate.  Sent to ER for further evaluation.  EKG reveals atrial fibrillation at a rapid ventricular rate of 160s.  Not felt palpitation.  Patient denies of orthopnea, PND, syncope, dizziness, headache, lower extremity edema, melena or blood in his stool or urine.  Patient works part-time at [employer].  Mostly on her feet.  No limiting shortness of breath or chest pain.  Regular exercise.  No recent illness.  The patient was admitted and started on IV Cardizem and IV heparin.  Due to persistent elevated heart rate, patient was started on amiodarone after discussion with Dr. Shirlee Latch.  She is converted to sinus rhythm overnight.  Now off amiodarone and Cardizem.  She is back to her baseline.  Phenytoin level is subtherapeutic at 2.5.  Normal TSH with elevated free T4.  Chest x-ray with bibasilar atelectasis.  BNP 183.8.  Potassium of 3.3 today.  Past Medical History:  Diagnosis Date  . MS (multiple sclerosis) (HCC)   . Seizure [facility])     Past Surgical History:  Procedure Laterality Date  . ABDOMINAL HYSTERECTOMY    . HYSTEROTOMY      Inpatient Medications: Scheduled Meds: . aspirin  325 mg Oral  Daily  . phenytoin  300 mg Oral QHS   Continuous Infusions: . sodium chloride 75 mL/hr at 12/25/16 2355  . heparin 650 Units/hr (12/26/16 0428)   PRN Meds: acetaminophen, hydrOXYzine, LORazepam, morphine injection, nitroGLYCERIN, zolpidem  Allergies:    Allergies  Allergen Reactions  . Carbamazepine   . Glatiramer Acetate     REACTION: anaphylaxis    Social History:   Social History   Socioeconomic History  . Marital status: Married    Spouse name: [name]  . Number of children: 0  . Years of education: Not on file  . Highest education level: Not on file  Social Needs  . Financial resource strain: Not on file  . Food insecurity - worry: Not on file  . Food insecurity - inability: Not on file  . Transportation needs - medical: Not on file  . Transportation needs - non-medical: Not on file  Occupational History  . Not on file  Tobacco Use  . Smoking status: Former Games developer  . Smokeless tobacco: Never Used  Substance and Sexual Activity  . Alcohol use: No  . Drug use: No  . Sexual activity: Not on file  Other Topics Concern  . Not on file  Social History Narrative   Patient is married and does not work.    Patient quit smoking in 2007.    Family History:    Family History  Problem Relation Age of  Onset  . Colon cancer Mother   . Heart attack Father      ROS:  Please see the history of present illness.  ROS All other ROS reviewed and negative.     Physical Exam/Data:   Vitals:   12/26/16 0353 12/26/16 0400 12/26/16 0500 12/26/16 0700  BP:  (!) 86/53 (!) 82/52 (!) 79/47  Pulse:      Resp:  20 (!) 21 18  Temp: 98.6 F (37 C)     TempSrc: Oral     SpO2:  98% 98% 99%  Weight:      Height:        Intake/Output Summary (Last 24 hours) at 12/26/2016 0804 Last data filed at 12/26/2016 0500 Gross per 24 hour  Intake 1449.92 ml  Output 300 ml  Net 1149.92 ml   Filed Weights   12/25/16 1907 12/26/16 0000  Weight: 95 lb (43.1 kg) 97 lb 0 oz (44 kg)    Body mass index is 17.18 kg/m.  General: Thin frail female in no acute distress HEENT: normal Lymph: no adenopathy Neck: no JVD Endocrine:  No thryomegaly Vascular: No carotid bruits; FA pulses 2+ bilaterally without bruits  Cardiac:  normal S1, S2; RRR; no murmur  Lungs:  clear to auscultation bilaterally, no wheezing, rhonchi or rales  Abd: soft, nontender, no hepatomegaly, right-sided flank pain Ext: no edema Musculoskeletal:  No deformities, BUE and BLE strength normal and equal Skin: warm and dry  Neuro:  CNs 2-12 intact, no focal abnormalities noted Psych:  Normal affect   EKG:  The EKG was personally reviewed and demonstrates: This rhythm at rate of 85 bpm, ST/T wave abnormality in anterior leads Telemetry:  Telemetry was personally reviewed and demonstrates: Sinus rhythm at controlled ventricular rate  Relevant CV Studies: Pending echocardiogram  Laboratory Data:  Chemistry Recent Labs  Lab 12/25/16 1743 12/26/16 0234  NA 139 137  K 4.1 3.3*  CL 101 106  CO2 28 23  GLUCOSE 129* 122*  BUN 26* 20  CREATININE 0.77 0.64  CALCIUM 9.6 8.2*  GFRNONAA >60 >60  GFRAA >60 >60  ANIONGAP 10 8    Recent Labs  Lab 12/25/16 1743  PROT 8.6*  ALBUMIN 3.7  AST 45*  ALT 32  ALKPHOS 95  BILITOT 0.9   Hematology Recent Labs  Lab 12/25/16 1743 12/26/16 0234  WBC 8.8 7.4  RBC 4.72 3.79*  HGB 13.0 10.2*  HCT 39.2 31.3*  MCV 83.1 82.6  MCH 27.5 26.9  MCHC 33.2 32.6  RDW 14.1 14.6  PLT 266 187   Cardiac Enzymes Recent Labs  Lab 12/25/16 2015 12/26/16 0234  TROPONINI <0.03 <0.03    Recent Labs  Lab 12/25/16 1755  TROPIPOC 0.00    BNP Recent Labs  Lab 12/26/16 0234  BNP 183.8*    Radiology/Studies:  Dg Chest 2 View  Result Date: 12/25/2016 CLINICAL DATA:  Chest pain. EXAM: CHEST  2 VIEW COMPARISON:  None available currently. FINDINGS: The heart size and mediastinal contours are within normal limits. No pneumothorax is noted. Minimal right  basilar subsegmental atelectasis is noted with minimal right pleural effusion. Mild left basilar subsegmental atelectasis is noted with mild left pleural effusion. The visualized skeletal structures are unremarkable. IMPRESSION: Minimal to mild bibasilar subsegmental atelectasis and pleural effusions. Electronically Signed   By: Lupita Raider, M.D.   On: 12/25/2016 18:21    Assessment and Plan:   1. New onset atrial flutter with rapid ventricular rate - Converted to  sinus rhythm overnight on IV amiodarone and IV Cardizem, now off.  Unclear etiology.  Her symptom is chest pain and shortness of breath.  Normal TSH with elevated free T4. Pending echo. CHADSVASC score of 1 for sex.  - Will review anticoagulation and rate control therapy with MD.   2. Chest pain -Likely tachycardia induced.  She does not have any angina at baseline.  Troponin negative.  However, EKG shows ST/T wave changes in anterior leads.  Further evaluation pending echocardiogram.  3.  Abnormal thyroid function test -Per primary team  4.  Right-sided flank pain -Per primary team  5.  Acute anemia -Hemoglobin down to 10.2 from 13 on IV heparin.  She denies any bleeding.  Follow closely.  6. Hypokalemia - Give Kdur 40meq x 1.   For questions or updates, please contact [facility] Please consult [email or web address] for contact info under Cardiology/STEMI.   Vonzella NippleSigned, Bhavinkumar Lake ShoreBhagat, [place]  12/26/2016 8:04 AM    The patient was seen, examined and discussed with Bhagat,Bhavinkumar PA-C and I agree with the above.   61 y.o. female with a hx of seizure and multiple sclerosis who is being seen today for the evaluation of atrial flutter, she is new to us, no prior cardiac history.  History of 15 pack year tobacco abuse.  Quit approximately 15 years ago.  Father died of MI at age 60. She presented with chest pain and shortness of breath, on and off, non-exertional, worse with deep inspiration and with position changes, worse on  the right side. Tachycardic at her PCP office yesterday. EKG reveals atrial fibrillation at a rapid ventricular rate of 160s.  Not felt palpitation.  Patient denies of orthopnea, PND, syncope, dizziness, headache, lower extremity edema, melena or blood in his stool or urine.   Echo showed - LVEF 60-65%, normal wall thickness, normal wall motion, normal diastolic function, aortic valve sclerosis, mild MR, moderate LAE, mild TR, RVSP 26 mmHg, normal IVC. The patient was admitted and started on IV Cardizem and IV heparin.  Due to persistent elevated heart rate, patient was started on amiodarone after discussion with Dr. Shirlee LatchMcLean.  She is converted to sinus rhythm overnight.  Now off amiodarone and Cardizem.  She is back to her baseline. On physical exam she appears frail, has mild systolic murmur, no rub lungs CTA, no LE edema.   She is rather hypotensive and HR in 90'. I would not start any antiarrhythmics given hypotension, if her a-flutter recurs we will consider an ablation. CHADS-VASc is one, I would start aspirin 81 mg po daily only. She would be a difficult anticoagulation candidate given anemia, interaction with phenytoin, fall risk.  Potassium is low, s/p replacement with a 40mEq daily.  He symptoms are consistent with acte pericarditis, I will start ibuprofen 400 mg po BID x 5 days and colchicine 0.6 mg po BID. She can be transferred to the floor, anticipated discharge tomorrow.    Tobias AlexanderKatarina Silver Parkey, MD 12/26/2016

## 2016-12-26 NOTE — Progress Notes (Signed)
Initial Nutrition Assessment  DOCUMENTATION CODES:   Underweight  INTERVENTION:  - Diet advancement as medically feasible. - RD will assess for malnutrition at follow-up.   NUTRITION DIAGNOSIS:   Inadequate oral intake related to inability to eat as evidenced by NPO status.  GOAL:   Patient will meet greater than or equal to 90% of their needs  MONITOR:   Diet advancement, Weight trends, Labs  REASON FOR ASSESSMENT:   Malnutrition Screening Tool  ASSESSMENT:   61 y.o. female with a hx of seizure and multiple sclerosis. Admitted for the evaluation of atrial flutter   BMI indicates underweight status. Pt has been NPO since admission. Pt sleeping at time of RD visit and did not arouse to name call x3. No family/visitors present at this time. Per chart, pt works part-time at [employer] and is on her feet most of the time she is at work.   Unable to perform NFPE at this time but able to visualize some degree of muscle wasting to temple and clavicle areas. Per chart review, pt has lost 5 lbs (5% body weight) in the past 3 months. This is not significant for time frame but is significant given underweight status.   Medications reviewed; 1 g IV Mg sulfate x1 run yesterday, 40 mEq oral KCl x2 doses today. Labs reviewed; CBG: 108 mg/dL this AM, K: 3.3 mmol/L, Ca: 8.2 mg/dL.   IVF: NS @ 75 mL/hr.      NUTRITION - FOCUSED PHYSICAL EXAM:  Will attempt to perform/assess at follow-up.      Diet Order:  Diet Heart Room service appropriate? Yes; Fluid consistency: Thin  EDUCATION NEEDS:   No education needs have been identified at this time  Skin:  Skin Assessment: Reviewed RN Assessment  Last BM:  11/25  Height:   Ht Readings from Last 1 Encounters:  12/26/16 5\' 3"  (1.6 m)    Weight:   Wt Readings from Last 1 Encounters:  12/26/16 97 lb 0 oz (44 kg)    Ideal Body Weight:  52.27 kg  BMI:  Body mass index is 17.18 kg/m.  Estimated Nutritional Needs:    Kcal:  1320-1540 (30-35 kcal/kg)  Protein:  45-55 grams (1-1.2 grams/kg)  Fluid:  >/= 1.5 L/day     Trenton Gammon, MS, RD, LDN, [facility] Inpatient Clinical Dietitian Pager # [phone] After hours/weekend pager # [phone]

## 2016-12-27 DIAGNOSIS — I5031 Acute diastolic (congestive) heart failure: Secondary | ICD-10-CM

## 2016-12-27 DIAGNOSIS — R072 Precordial pain: Secondary | ICD-10-CM

## 2016-12-27 DIAGNOSIS — G35 Multiple sclerosis: Secondary | ICD-10-CM

## 2016-12-27 DIAGNOSIS — G40209 Localization-related (focal) (partial) symptomatic epilepsy and epileptic syndromes with complex partial seizures, not intractable, without status epilepticus: Secondary | ICD-10-CM

## 2016-12-27 LAB — BASIC METABOLIC PANEL
Anion gap: 5 (ref 5–15)
BUN: 15 mg/dL (ref 6–20)
CO2: 22 mmol/L (ref 22–32)
Calcium: 7.9 mg/dL — ABNORMAL LOW (ref 8.9–10.3)
Chloride: 110 mmol/L (ref 101–111)
Creatinine, Ser: 0.58 mg/dL (ref 0.44–1.00)
GFR calc Af Amer: >60 mL/min (ref >60)
GFR calc non Af Amer: >60 mL/min (ref >60)
Glucose, Bld: 104 mg/dL — ABNORMAL HIGH (ref 65–99)
Potassium: 4.4 mmol/L (ref 3.5–5.1)
Sodium: 137 mmol/L (ref 135–145)

## 2016-12-27 LAB — CBC
HCT: 26.3 % — ABNORMAL LOW (ref 36.0–46.0)
Hemoglobin: 8.6 g/dL — ABNORMAL LOW (ref 12.0–15.0)
MCH: 27.1 pg (ref 26.0–34.0)
MCHC: 32.7 g/dL (ref 30.0–36.0)
MCV: 83 fL (ref 78.0–100.0)
Platelets: 173 10*3/uL (ref 150–400)
RBC: 3.17 MIL/uL — ABNORMAL LOW (ref 3.87–5.11)
RDW: 14.6 % (ref 11.5–15.5)
WBC: 6.1 10*3/uL (ref 4.0–10.5)

## 2016-12-27 LAB — T3, FREE: T3, Free: 1.9 pg/mL — ABNORMAL LOW (ref 2.0–4.4)

## 2016-12-27 LAB — GLUCOSE, CAPILLARY: Glucose-Capillary: 103 mg/dL — ABNORMAL HIGH (ref 65–99)

## 2016-12-27 MED ORDER — FUROSEMIDE 10 MG/ML IJ SOLN: 40.0000 mg | Freq: Two times a day (BID) | INTRAMUSCULAR | Status: DC

## 2016-12-27 MED ORDER — FUROSEMIDE 10 MG/ML IJ SOLN
20.0000 mg | Freq: Two times a day (BID) | INTRAMUSCULAR | Status: DC
  Administered 2016-12-27: 20 mg via INTRAVENOUS
  Filled 2016-12-27: qty 2

## 2016-12-27 NOTE — Progress Notes (Signed)
Progress Note  Patient Name: Sherri Ramirez Date of Encounter: 12/27/2016  Primary Care Provider: Andi DevonShelton, [name], MD Primary Cardiologist: New to Dr. Delton SeeNelson   Subjective   She feels better today, improved breathing.   Inpatient Medications    Scheduled Meds: . aspirin EC  81 mg Oral Daily  . colchicine  0.6 mg Oral BID  . furosemide  20 mg Intravenous Q12H  . ibuprofen  400 mg Oral BID  . phenytoin  300 mg Oral QHS   Continuous Infusions:  PRN Meds: acetaminophen, hydrOXYzine, LORazepam, morphine injection, nitroGLYCERIN, zolpidem   Vital Signs    Vitals:   12/26/16 1901 12/26/16 2017 12/27/16 0431 12/27/16 1258  BP: 107/70 103/63 106/68 105/66  Pulse: 94 91 95 95  Resp:  20 18 18   Temp: 99.3 F (37.4 C) 98.8 F (37.1 C) 98.6 F (37 C) 99.5 F (37.5 C)  TempSrc: Oral Oral Oral Oral  SpO2: 100% 98% 98% 98%  Weight:      Height:        Intake/Output Summary (Last 24 hours) at 12/27/2016 1420 Last data filed at 12/27/2016 0915 Gross per 24 hour  Intake 2115 ml  Output 136 ml  Net 1979 ml   Filed Weights   12/25/16 1907 12/26/16 0000  Weight: 95 lb (43.1 kg) 97 lb 0 oz (44 kg)    Telemetry    SR - Personally Reviewed  ECG    SR - Personally Reviewed  Physical Exam   GEN: No acute distress.   Neck: No JVD Cardiac: RRR, no murmurs, rubs, or gallops.  Respiratory:carckles at bases B/L  GI: Soft, nontender, non-distended  MS: No edema; No deformity. Neuro:  Nonfocal  Psych: Normal affect   Labs    Chemistry Recent Labs  Lab 12/25/16 1743 12/26/16 0234 12/27/16 0421  NA 139 137 137  K 4.1 3.3* 4.4  CL 101 106 110  CO2 28 23 22   GLUCOSE 129* 122* 104*  BUN 26* 20 15  CREATININE 0.77 0.64 0.58  CALCIUM 9.6 8.2* 7.9*  PROT 8.6*  --   --   ALBUMIN 3.7  --   --   AST 45*  --   --   ALT 32  --   --   ALKPHOS 95  --   --   BILITOT 0.9  --   --   GFRNONAA >60 >60 >60  GFRAA >60 >60 >60  ANIONGAP 10 8 5      Hematology Recent  Labs  Lab 12/25/16 1743 12/26/16 0234 12/26/16 0743 12/27/16 0421  WBC 8.8 7.4  --  6.1  RBC 4.72 3.79* 3.59* 3.17*  HGB 13.0 10.2*  --  8.6*  HCT 39.2 31.3*  --  26.3*  MCV 83.1 82.6  --  83.0  MCH 27.5 26.9  --  27.1  MCHC 33.2 32.6  --  32.7  RDW 14.1 14.6  --  14.6  PLT 266 187  --  173    Cardiac Enzymes Recent Labs  Lab 12/25/16 2015 12/26/16 0234 12/26/16 0744  TROPONINI <0.03 <0.03 <0.03    Recent Labs  Lab 12/25/16 1755  TROPIPOC 0.00     BNP Recent Labs  Lab 12/26/16 0234  BNP 183.8*     DDimer No results for input(s): DDIMER in the last 168 hours.   Radiology    Dg Chest 2 View  Result Date: 12/25/2016 CLINICAL DATA:  Chest pain. EXAM: CHEST  2 VIEW COMPARISON:  None available  currently. FINDINGS: The heart size and mediastinal contours are within normal limits. No pneumothorax is noted. Minimal right basilar subsegmental atelectasis is noted with minimal right pleural effusion. Mild left basilar subsegmental atelectasis is noted with mild left pleural effusion. The visualized skeletal structures are unremarkable. IMPRESSION: Minimal to mild bibasilar subsegmental atelectasis and pleural effusions. Electronically Signed   By: Lupita Raider, M.D.   On: 12/25/2016 18:21    Cardiac Studies    EKG:  The EKG was personally reviewed and demonstrates: This rhythm at rate of 85 bpm, ST/T wave abnormality in anterior leads Telemetry:  Telemetry was personally reviewed and demonstrates: Sinus rhythm at controlled ventricular rate     Patient Profile     61 y.o. female   Assessment & Plan    1. New onset atrial flutter with rapid ventricular rate 2. Chest pain 3.  Abnormal thyroid function test 4.  Right-sided flank pain 5.  Acute anemia 6. Hypokalemia  61 y.o. female with a hx of seizure and multiple sclerosis who is being seen today for the evaluation of atrial flutter, she is new to [place], no prior cardiac history.  History of 15 pack year  tobacco abuse.  Quit approximately 15 years ago.  Father died of MI at age 62. She presented with chest pain and shortness of breath, on and off, non-exertional, worse with deep inspiration and with position changes, worse on the right side. Tachycardic at her PCP office yesterday. EKG reveals atrial fibrillation at a rapid ventricular rate of 160s.  Not felt palpitation.  Patient denies of orthopnea, PND, syncope, dizziness, headache, lower extremity edema, melena or blood in his stool or urine.   Echo showed - LVEF 60-65%, normal wall thickness, normal wall motion, normal diastolic function, aortic valve sclerosis, mild MR, moderate LAE, mild TR, RVSP 26 mmHg, normal IVC. The patient was admitted and started on IV Cardizem and IV heparin.  Due to persistent elevated heart rate, patient was started on amiodarone after discussion with Dr. Shirlee Latch.  She is converted to sinus rhythm overnight.  Now off amiodarone and Cardizem.  She is back to her baseline. On physical exam she appears frail, has mild systolic murmur, no rub lungs CTA, no LE edema.   She is rather hypotensive and HR in 90'. I would not start any antiarrhythmics given hypotension, if her a-flutter recurs we will consider an ablation. CHADS-VASc is one, I would start aspirin 81 mg po daily only. She would be a difficult anticoagulation candidate given anemia, interaction with phenytoin, fall risk.  Potassium is low, replaced yesterday, now 4.4. She remains in SR.  He symptoms are consistent with acte pericarditis, I started ibuprofen 400 mg po BID x 5 days and colchicine 0.6 mg po BID. She was also started on lasix 20 iv BID for CHF, minimal diuresis, I will increase to 40 mg iv BID, anticipated discharge tomorrow.   For questions or updates, please contact [facility] Please consult [email or web address] for contact info under Cardiology/STEMI.    Signed, Tobias Alexander, MD  12/27/2016, 2:20 PM

## 2016-12-27 NOTE — Progress Notes (Signed)
[facility]  PROGRESS NOTE  Sherri Ramirez DJM:[id number] DOB: Dec 26, 1955 DOA: 12/25/2016 PCP: Andi Devon, MD   Brief HPI:   61 y.o.femalewith medical history significant ofMS, Seizure, whopresents with chest pain, shortness of breath and palpitation. She was found to be in new onset afib with RVR with rate in 160's.  Chest x-ray has no infiltration, with mild bilateral basilar atelectasis and minimal pleural effusion. Patient is admitted to stepdown as inpatient. Cardiology was consulted.     Subjective   Patient seen and examined, complains of chest pain on deep breathing.  Also complains of dyspnea on exertion   Assessment/Plan:     1. New onset atrial flutter with RVR-converted to sinus rhythm overnight.  IV Cardizem and amiodarone have been stopped.  Patient's chads 2 vasc score is 1, started on aspirin for anticoagulation.  Cardiology following. 2. Mild pulmonary edema-patient's exam consistent with mild pulmonary edema, will start Lasix 20 mg IV every 12 hours.  Follow intake and output.  Check BMP in a.m. 3. Pericarditis/chest pain-patient started on colchicine and NSAIDs per cardiology. 4. Seizure disorder-ferritin level subtherapeutic, 1 dose of IV phenytoin  250 mg given.  Continue home medications. 5. Anemia-patient's hemoglobin dropped to 8.6, iron saturation 19%.  Stool for occult blood is pending.  Follow CBC..    DVT prophylaxis: Lovenox  Code Status: Full code  Family Communication: No family at bedside  Disposition Plan: Likely home in a.m.   Consultants:  Cardiology  Procedures:  None  Continuous infusions     Antibiotics:   Anti-infectives (From admission, onward)   None       Objective   Vitals:   12/26/16 1901 12/26/16 2017 12/27/16 0431 12/27/16 1258  BP: 107/70 103/63 106/68 105/66  Pulse: 94 91 95 95  Resp:  20 18 18   Temp: 99.3 F (37.4 C) 98.8 F (37.1 C) 98.6 F (37 C) 99.5 F (37.5 C)  TempSrc: Oral  Oral Oral Oral  SpO2: 100% 98% 98% 98%  Weight:      Height:        Intake/Output Summary (Last 24 hours) at 12/27/2016 1803 Last data filed at 12/27/2016 1500 Gross per 24 hour  Intake 2355 ml  Output 1 ml  Net 2354 ml   Filed Weights   12/25/16 1907 12/26/16 0000  Weight: 43.1 kg (95 lb) 44 kg (97 lb 0 oz)     Physical Examination:  Physical Exam: Eyes: No icterus, extraocular muscles intact  Mouth: Oral mucosa is moist, no lesions on palate,  Neck: Supple, no deformities, masses, or tenderness Lungs: Normal respiratory effort, bibasilar crackles auscultated Heart: Regular rate and rhythm, S1 and S2 normal, no murmurs, rubs auscultated Abdomen: BS normoactive,soft,nondistended,non-tender to palpation,no organomegaly Extremities: No pretibial edema, no erythema, no cyanosis, no clubbing Neuro : Alert and oriented to time, place and person, No focal deficits  Skin: No rashes seen on exam       Data Reviewed: I have personally reviewed following labs and imaging studies  CBG: Recent Labs  Lab 12/26/16 0749 12/27/16 0754  GLUCAP 108* 103*    CBC: Recent Labs  Lab 12/25/16 1743 12/26/16 0234 12/27/16 0421  WBC 8.8 7.4 6.1  NEUTROABS 6.9  --   --   HGB 13.0 10.2* 8.6*  HCT 39.2 31.3* 26.3*  MCV 83.1 82.6 83.0  PLT 266 187 173    Basic Metabolic Panel: Recent Labs  Lab 12/25/16 1743 12/25/16 2015 12/26/16 0234 12/27/16 0421  NA 139  --  137 137  K 4.1  --  3.3* 4.4  CL 101  --  106 110  CO2 28  --  23 22  GLUCOSE 129*  --  122* 104*  BUN 26*  --  20 15  CREATININE 0.77  --  0.64 0.58  CALCIUM 9.6  --  8.2* 7.9*  MG  --  1.8  --   --     Recent Results (from the past 240 hour(s))  MRSA PCR Screening     Status: None   Collection Time: 12/26/16 12:30 AM  Result Value Ref Range Status   MRSA by PCR NEGATIVE NEGATIVE Final    Comment:        The GeneXpert MRSA Assay (FDA approved for NASAL specimens only), is one component of  a comprehensive MRSA colonization surveillance program. It is not intended to diagnose MRSA infection nor to guide or monitor treatment for MRSA infections.      Liver Function Tests: Recent Labs  Lab 12/25/16 1743  AST 45*  ALT 32  ALKPHOS 95  BILITOT 0.9  PROT 8.6*  ALBUMIN 3.7   Recent Labs  Lab 12/25/16 1743  LIPASE 24   No results for input(s): AMMONIA in the last 168 hours.  Cardiac Enzymes: Recent Labs  Lab 12/25/16 2015 12/26/16 0234 12/26/16 0744  TROPONINI <0.03 <0.03 <0.03   BNP (last 3 results) Recent Labs    12/26/16 0234  BNP 183.8*    ProBNP (last 3 results) No results for input(s): PROBNP in the last 8760 hours.    Studies: Dg Chest 2 View  Result Date: 12/25/2016 CLINICAL DATA:  Chest pain. EXAM: CHEST  2 VIEW COMPARISON:  None available currently. FINDINGS: The heart size and mediastinal contours are within normal limits. No pneumothorax is noted. Minimal right basilar subsegmental atelectasis is noted with minimal right pleural effusion. Mild left basilar subsegmental atelectasis is noted with mild left pleural effusion. The visualized skeletal structures are unremarkable. IMPRESSION: Minimal to mild bibasilar subsegmental atelectasis and pleural effusions. Electronically Signed   By: Lupita RaiderJames  Green Jr, M.D.   On: 12/25/2016 18:21    Scheduled Meds: . aspirin EC  81 mg Oral Daily  . colchicine  0.6 mg Oral BID  . [START ON 12/28/2016] furosemide  40 mg Intravenous Q12H  . ibuprofen  400 mg Oral BID  . phenytoin  300 mg Oral QHS      Time spent: 25 min  Meredeth IdeGagan S Tigerlily Christine   [facility] Pager [phone]. If 7PM-7AM, please contact night-coverage at [email or web address], Office  [phone]  password [email or web address]  12/27/2016, 6:03 PM  LOS: 2 days

## 2016-12-27 NOTE — Progress Notes (Signed)
Tonight patient had a fever of 101.1. Scheduled dose of Advil was given. On call was notified of fever. No new orders given. Also ,patient stated that her interferon injection was due tonight but RN told patient that there was no orders and that RN had to page on call about it. On call notified and no new orders given. Patient stated that she was going to take her injection either way.

## 2016-12-28 ENCOUNTER — Inpatient Hospital Stay (HOSPITAL_COMMUNITY): Payer: Managed Care, Other (non HMO)

## 2016-12-28 DIAGNOSIS — J189 Pneumonia, unspecified organism: Secondary | ICD-10-CM

## 2016-12-28 LAB — BASIC METABOLIC PANEL
Anion gap: 5 (ref 5–15)
BUN: 11 mg/dL (ref 6–20)
CO2: 26 mmol/L (ref 22–32)
Calcium: 8.6 mg/dL — ABNORMAL LOW (ref 8.9–10.3)
Chloride: 107 mmol/L (ref 101–111)
Creatinine, Ser: 0.6 mg/dL (ref 0.44–1.00)
GFR calc Af Amer: >60 mL/min (ref >60)
GFR calc non Af Amer: >60 mL/min (ref >60)
Glucose, Bld: 107 mg/dL — ABNORMAL HIGH (ref 65–99)
Potassium: 3.9 mmol/L (ref 3.5–5.1)
Sodium: 138 mmol/L (ref 135–145)

## 2016-12-28 LAB — CBC
HCT: 28.6 % — ABNORMAL LOW (ref 36.0–46.0)
Hemoglobin: 9.3 g/dL — ABNORMAL LOW (ref 12.0–15.0)
MCH: 26.9 pg (ref 26.0–34.0)
MCHC: 32.5 g/dL (ref 30.0–36.0)
MCV: 82.7 fL (ref 78.0–100.0)
Platelets: 215 10*3/uL (ref 150–400)
RBC: 3.46 MIL/uL — ABNORMAL LOW (ref 3.87–5.11)
RDW: 14.9 % (ref 11.5–15.5)
WBC: 7.4 10*3/uL (ref 4.0–10.5)

## 2016-12-28 LAB — GLUCOSE, CAPILLARY: Glucose-Capillary: 96 mg/dL (ref 65–99)

## 2016-12-28 MED ORDER — INTERFERON BETA-1A 44 MCG/0.5ML ~~LOC~~ SOAJ: 44.0000 ug | SUBCUTANEOUS | Status: DC

## 2016-12-28 MED ORDER — VANCOMYCIN HCL 500 MG IV SOLR
500.0000 mg | INTRAVENOUS | Status: DC
  Filled 2016-12-28: qty 500

## 2016-12-28 MED ORDER — DEXTROSE 5 % IV SOLN
1.0000 g | Freq: Two times a day (BID) | INTRAVENOUS | Status: DC
  Administered 2016-12-28 – 2016-12-29 (×2): 1 g via INTRAVENOUS
  Filled 2016-12-28 (×3): qty 1

## 2016-12-28 MED ORDER — VANCOMYCIN HCL IN DEXTROSE 750-5 MG/150ML-% IV SOLN
750.0000 mg | Freq: Once | INTRAVENOUS | Status: AC
  Administered 2016-12-28: 750 mg via INTRAVENOUS
  Filled 2016-12-28: qty 150

## 2016-12-28 NOTE — Progress Notes (Signed)
Pharmacy Antibiotic Note  Sherri Ramirez is a 61 y.o. female admitted on 12/25/2016 with [facility], chest pain, palpitations. Found in new-onset Afib with RVR in 160's, mild pulmonary edema noted, pericarditis. CXray on admit with small pleural effusions. Repeat CXray 11/29 with increasing bilateral effusions, L base opacity, consider PNA.  Pharmacy has been consulted for Vancomycin & Cefepime dosing.  Plan: Vancomycin 750mg  x1, then 500mg  IV every 24 hours.  Goal trough 15-20 mcg/mL.  Cefepime 1gm q12 Consider early d/c of Vancomycin with negative MRSA PCR Follow cx, renal function  Height: 5\' 3"  (160 cm) Weight: 97 lb 0 oz (44 kg) IBW/kg (Calculated) : 52.4  Temp (24hrs), Avg:99.8 F (37.7 C), Min:98.6 F (37 C), Max:101.1 F (38.4 C)  Recent Labs  Lab 12/25/16 1743 12/26/16 0234 12/27/16 0421 12/28/16 0433  WBC 8.8 7.4 6.1 7.4  CREATININE 0.77 0.64 0.58 0.60    Estimated Creatinine Clearance: 51.9 mL/min (by C-G formula based on SCr of 0.6 mg/dL).    Allergies  Allergen Reactions  . Carbamazepine   . Glatiramer Acetate     REACTION: anaphylaxis   Antimicrobials this admission: 11/29 Vancomycin >>  11/29 Cefepime >>   Dose adjustments this admission:  Microbiology results: 11/29 BCx: sent 11/27 MRSA PCR: negative  Thank you for allowing pharmacy to be a part of this patient's care.  Otho Bellows PharmD Pager [phone] 12/28/2016, 2:16 PM

## 2016-12-28 NOTE — Progress Notes (Signed)
Progress Note  Patient Name: Sherri Ramirez Date of Encounter: 12/28/2016  Primary Care Provider: Andi Devon, MD Primary Cardiologist: New to Dr. Delton See   Subjective   She feels better today, improved breathing.   Inpatient Medications    Scheduled Meds: . aspirin EC  81 mg Oral Daily  . colchicine  0.6 mg Oral BID  . furosemide  40 mg Intravenous Q12H  . ibuprofen  400 mg Oral BID  . [START ON 12/29/2016] Interferon Beta-1a  44 mcg Subcutaneous Once per day on Mon Wed Fri  . phenytoin  300 mg Oral QHS   Continuous Infusions: . ceFEPime (MAXIPIME) IV    . [START ON 12/29/2016] vancomycin    . vancomycin     PRN Meds: acetaminophen, hydrOXYzine, LORazepam, morphine injection, nitroGLYCERIN, zolpidem   Vital Signs    Vitals:   12/27/16 2037 12/27/16 2209 12/28/16 0055 12/28/16 0444  BP: 112/64   103/61  Pulse: (!) 102   85  Resp: 18   18  Temp: (!) 101.1 F (38.4 C) (!) 100.5 F (38.1 C) 99 F (37.2 C) 98.6 F (37 C)  TempSrc: Oral Oral Oral Oral  SpO2: 100%   100%  Weight:      Height:        Intake/Output Summary (Last 24 hours) at 12/28/2016 1327 Last data filed at 12/28/2016 1100 Gross per 24 hour  Intake 840 ml  Output 600 ml  Net 240 ml   Filed Weights   12/25/16 1907 12/26/16 0000  Weight: 95 lb (43.1 kg) 97 lb 0 oz (44 kg)    Telemetry    SR - Personally Reviewed  ECG    SR - Personally Reviewed  Physical Exam   GEN: No acute distress.   Neck: No JVD Cardiac: RRR, no murmurs, rubs, or gallops.  Respiratory:carckles at bases B/L  GI: Soft, nontender, non-distended  MS: No edema; No deformity. Neuro:  Nonfocal  Psych: Normal affect   Labs    Chemistry Recent Labs  Lab 12/25/16 1743 12/26/16 0234 12/27/16 0421 12/28/16 0433  NA 139 137 137 138  K 4.1 3.3* 4.4 3.9  CL 101 106 110 107  CO2 28 23 22 26   GLUCOSE 129* 122* 104* 107*  BUN 26* 20 15 11   CREATININE 0.77 0.64 0.58 0.60  CALCIUM 9.6 8.2* 7.9* 8.6*  PROT  8.6*  --   --   --   ALBUMIN 3.7  --   --   --   AST 45*  --   --   --   ALT 32  --   --   --   ALKPHOS 95  --   --   --   BILITOT 0.9  --   --   --   GFRNONAA >60 >60 >60 >60  GFRAA >60 >60 >60 >60  ANIONGAP 10 8 5 5      Hematology Recent Labs  Lab 12/26/16 0234 12/26/16 0743 12/27/16 0421 12/28/16 0433  WBC 7.4  --  6.1 7.4  RBC 3.79* 3.59* 3.17* 3.46*  HGB 10.2*  --  8.6* 9.3*  HCT 31.3*  --  26.3* 28.6*  MCV 82.6  --  83.0 82.7  MCH 26.9  --  27.1 26.9  MCHC 32.6  --  32.7 32.5  RDW 14.6  --  14.6 14.9  PLT 187  --  173 215    Cardiac Enzymes Recent Labs  Lab 12/25/16 2015 12/26/16 0234 12/26/16 0744  TROPONINI <  0.03 <0.03 <0.03    Recent Labs  Lab 12/25/16 1755  TROPIPOC 0.00     BNP Recent Labs  Lab 12/26/16 0234  BNP 183.8*     DDimer No results for input(s): DDIMER in the last 168 hours.   Radiology    Dg Chest 2 View  Result Date: 12/28/2016 CLINICAL DATA:  Fever EXAM: CHEST  2 VIEW COMPARISON:  Chest x-ray of 12/25/2016 FINDINGS: There are bilateral pleural effusions present which have increased somewhat in size. There is also more opacity at the left lung base suspicious for left lower lobe pneumonia. A mild degree of pulmonary vascular congestion cannot be excluded. The heart is mildly enlarged. No bony abnormality is seen. IMPRESSION: 1. Increasing small bilateral pleural effusions with increasing basilar atelectasis. 2. Increased opacity at the left lung base suspicious for pneumonia. 3. Cannot exclude mild superimposed pulmonary vascular congestion. Electronically Signed   By: Dwyane Dee M.D.   On: 12/28/2016 11:19    Cardiac Studies    EKG:  The EKG was personally reviewed and demonstrates: This rhythm at rate of 85 bpm, ST/T wave abnormality in anterior leads Telemetry:  Telemetry was personally reviewed and demonstrates: Sinus rhythm at controlled ventricular rate     Patient Profile     61 y.o. female   Assessment & Plan      1. New onset atrial flutter with rapid ventricular rate 2. Chest pain 3.  Abnormal thyroid function test 4.  Right-sided flank pain 5.  Acute anemia 6. Hypokalemia  61 y.o. female with a hx of seizure and multiple sclerosis who is being seen today for the evaluation of atrial flutter, she is new to [place], no prior cardiac history.  History of 15 pack year tobacco abuse.  Quit approximately 15 years ago.  Father died of MI at age 88. She presented with chest pain and shortness of breath, on and off, non-exertional, worse with deep inspiration and with position changes, worse on the right side. Tachycardic at her PCP office yesterday. EKG reveals atrial fibrillation at a rapid ventricular rate of 160s.  Not felt palpitation.  Patient denies of orthopnea, PND, syncope, dizziness, headache, lower extremity edema, melena or blood in his stool or urine.   Echo showed - LVEF 60-65%, normal wall thickness, normal wall motion, normal diastolic function, aortic valve sclerosis, mild MR, moderate LAE, mild TR, RVSP 26 mmHg, normal IVC. The patient was admitted and started on IV Cardizem and IV heparin.  Due to persistent elevated heart rate, patient was started on amiodarone after discussion with Dr. Shirlee Latch.  She is converted to sinus rhythm overnight.  Now off amiodarone and Cardizem.  She is back to her baseline. On physical exam she appears frail, has mild systolic murmur, no rub lungs CTA, no LE edema.   She is rather hypotensive and HR in 90'. I would not start any antiarrhythmics given hypotension, if her a-flutter recurs we will consider an ablation. CHADS-VASc is one, I would start aspirin 81 mg po daily only. She would be a difficult anticoagulation candidate given anemia, interaction with phenytoin, fall risk.  Potassium is low, replaced yesterday, now 4.4. She remains in SR.  He symptoms are consistent with acte pericarditis, I started ibuprofen 400 mg po BID x 5 days and colchicine 0.6 mg po  BID. She was diagnosed with pneumonia and started ob cefepime, she is tearful about staying here.  I would discontinue lasix.  She is stable for a discharge from cardiac standpoint,  we will sign off, we will arrange for an outpatient follow up.  For questions or updates, please contact [facility] Please consult [email or web address] for contact info under Cardiology/STEMI.    Signed, Tobias AlexanderKatarina Aleyah Balik, MD  12/28/2016, 1:27 PM

## 2016-12-28 NOTE — Progress Notes (Signed)
[facility]  PROGRESS NOTE  Sherri Ramirez LHT:[id number] DOB: 13-Mar-1955 DOA: 12/25/2016 PCP: Andi Devon, MD   Brief HPI:   61 y.o.femalewith medical history significant ofMS, Seizure, whopresents with chest pain, shortness of breath and palpitation. She was found to be in new onset afib with RVR with rate in 160's.  Chest x-ray has no infiltration, with mild bilateral basilar atelectasis and minimal pleural effusion. Patient is admitted to stepdown as inpatient. Cardiology was consulted.     Subjective   Patient seen and examined, developed fever last night.  T-max 101.  Repeat chest x-ray today shows left lower lobe infiltrate.   Assessment/Plan:     1. New onset atrial flutter with RVR-converted to sinus rhythm overnight.  IV Cardizem and amiodarone have been stopped.  Patient's chads 2 vasc score is 1, started on aspirin for anticoagulation.  Cardiology following. 2. Healthcare associated pneumonia-chest x-ray shows left lower lobe infiltrate.  Will start vancomycin and cefepime.  Obtain blood cultures x2. 3. Mild pulmonary edema-improved with diuresis.  Lasix has been discontinued.  Patient's exam  Was consistent with mild pulmonary edema 4. Pericarditis/chest pain-patient started on colchicine and NSAIDs per cardiology. 5. Seizure disorder-ferritin level subtherapeutic, 1 dose of IV phenytoin  250 mg given.  Continue home medications. 6. Anemia-patient's hemoglobin dropped to 8.6, iron saturation 19%.  Today hemoglobin is 9.3, likely dilutional improved after diuresis.  Stool for occult blood is pending.  Follow CBC in am.    DVT prophylaxis: Lovenox  Code Status: Full code  Family Communication: No family at bedside  Disposition Plan: Likely home in a.m.   Consultants:  Cardiology  Procedures:  None  Continuous infusions . ceFEPime (MAXIPIME) IV    . [START ON 12/29/2016] vancomycin    . vancomycin        Antibiotics:   Anti-infectives  (From admission, onward)   Start     Dose/Rate Route Frequency Ordered Stop   12/29/16 1500  vancomycin (VANCOCIN) 500 mg in sodium chloride 0.9 % 100 mL IVPB     500 mg 100 mL/hr over 60 Minutes Intravenous Every 24 hours 12/28/16 1312     12/28/16 1500  vancomycin (VANCOCIN) IVPB 750 mg/150 ml premix     750 mg 150 mL/hr over 60 Minutes Intravenous  Once 12/28/16 1312     12/28/16 1400  ceFEPIme (MAXIPIME) 1 g in dextrose 5 % 50 mL IVPB     1 g 100 mL/hr over 30 Minutes Intravenous Every 12 hours 12/28/16 1257         Objective   Vitals:   12/27/16 2037 12/27/16 2209 12/28/16 0055 12/28/16 0444  BP: 112/64   103/61  Pulse: (!) 102   85  Resp: 18   18  Temp: (!) 101.1 F (38.4 C) (!) 100.5 F (38.1 C) 99 F (37.2 C) 98.6 F (37 C)  TempSrc: Oral Oral Oral Oral  SpO2: 100%   100%  Weight:      Height:        Intake/Output Summary (Last 24 hours) at 12/28/2016 1437 Last data filed at 12/28/2016 1100 Gross per 24 hour  Intake 840 ml  Output 600 ml  Net 240 ml   Filed Weights   12/25/16 1907 12/26/16 0000  Weight: 43.1 kg (95 lb) 44 kg (97 lb 0 oz)     Physical Examination:  Physical Exam: Eyes: No icterus, extraocular muscles intact  Mouth: Oral mucosa is moist, no lesions on palate,  Neck: Supple, no  deformities, masses, or tenderness Lungs: Normal respiratory effort, bibasilar crackles Heart: Regular rate and rhythm, S1 and S2 normal, no murmurs, rubs auscultated Abdomen: BS normoactive,soft,nondistended,non-tender to palpation,no organomegaly Extremities: No pretibial edema, no erythema, no cyanosis, no clubbing Neuro : Alert and oriented to time, place and person, No focal deficits  Skin: No rashes seen on exam       Data Reviewed: I have personally reviewed following labs and imaging studies  CBG: Recent Labs  Lab 12/26/16 0749 12/27/16 0754 12/28/16 0736  GLUCAP 108* 103* 96    CBC: Recent Labs  Lab 12/25/16 1743 12/26/16 0234  12/27/16 0421 12/28/16 0433  WBC 8.8 7.4 6.1 7.4  NEUTROABS 6.9  --   --   --   HGB 13.0 10.2* 8.6* 9.3*  HCT 39.2 31.3* 26.3* 28.6*  MCV 83.1 82.6 83.0 82.7  PLT 266 187 173 215    Basic Metabolic Panel: Recent Labs  Lab 12/25/16 1743 12/25/16 2015 12/26/16 0234 12/27/16 0421 12/28/16 0433  NA 139  --  137 137 138  K 4.1  --  3.3* 4.4 3.9  CL 101  --  106 110 107  CO2 28  --  23 22 26   GLUCOSE 129*  --  122* 104* 107*  BUN 26*  --  20 15 11   CREATININE 0.77  --  0.64 0.58 0.60  CALCIUM 9.6  --  8.2* 7.9* 8.6*  MG  --  1.8  --   --   --     Recent Results (from the past 240 hour(s))  MRSA PCR Screening     Status: None   Collection Time: 12/26/16 12:30 AM  Result Value Ref Range Status   MRSA by PCR NEGATIVE NEGATIVE Final    Comment:        The GeneXpert MRSA Assay (FDA approved for NASAL specimens only), is one component of a comprehensive MRSA colonization surveillance program. It is not intended to diagnose MRSA infection nor to guide or monitor treatment for MRSA infections.      Liver Function Tests: Recent Labs  Lab 12/25/16 1743  AST 45*  ALT 32  ALKPHOS 95  BILITOT 0.9  PROT 8.6*  ALBUMIN 3.7   Recent Labs  Lab 12/25/16 1743  LIPASE 24   No results for input(s): AMMONIA in the last 168 hours.  Cardiac Enzymes: Recent Labs  Lab 12/25/16 2015 12/26/16 0234 12/26/16 0744  TROPONINI <0.03 <0.03 <0.03   BNP (last 3 results) Recent Labs    12/26/16 0234  BNP 183.8*    ProBNP (last 3 results) No results for input(s): PROBNP in the last 8760 hours.    Studies: Dg Chest 2 View  Result Date: 12/28/2016 CLINICAL DATA:  Fever EXAM: CHEST  2 VIEW COMPARISON:  Chest x-ray of 12/25/2016 FINDINGS: There are bilateral pleural effusions present which have increased somewhat in size. There is also more opacity at the left lung base suspicious for left lower lobe pneumonia. A mild degree of pulmonary vascular congestion cannot be  excluded. The heart is mildly enlarged. No bony abnormality is seen. IMPRESSION: 1. Increasing small bilateral pleural effusions with increasing basilar atelectasis. 2. Increased opacity at the left lung base suspicious for pneumonia. 3. Cannot exclude mild superimposed pulmonary vascular congestion. Electronically Signed   By: Dwyane Dee M.D.   On: 12/28/2016 11:19    Scheduled Meds: . aspirin EC  81 mg Oral Daily  . colchicine  0.6 mg Oral BID  . ibuprofen  400 mg Oral BID  . [START ON 12/29/2016] Interferon Beta-1a  44 mcg Subcutaneous Once per day on Mon Wed Fri  . phenytoin  300 mg Oral QHS      Time spent: 25 min  Meredeth IdeGagan S Mylan Lengyel   [facility] Pager [phone]. If 7PM-7AM, please contact night-coverage at [email or web address], Office  [phone]  password [email or web address]  12/28/2016, 2:37 PM  LOS: 3 days

## 2016-12-29 LAB — BASIC METABOLIC PANEL
Anion gap: 8 (ref 5–15)
BUN: 13 mg/dL (ref 6–20)
CO2: 24 mmol/L (ref 22–32)
Calcium: 8.1 mg/dL — ABNORMAL LOW (ref 8.9–10.3)
Chloride: 106 mmol/L (ref 101–111)
Creatinine, Ser: 0.6 mg/dL (ref 0.44–1.00)
GFR calc Af Amer: >60 mL/min (ref >60)
GFR calc non Af Amer: >60 mL/min (ref >60)
Glucose, Bld: 93 mg/dL (ref 65–99)
Potassium: 3.4 mmol/L — ABNORMAL LOW (ref 3.5–5.1)
Sodium: 138 mmol/L (ref 135–145)

## 2016-12-29 LAB — CBC
HCT: 27 % — ABNORMAL LOW (ref 36.0–46.0)
Hemoglobin: 8.8 g/dL — ABNORMAL LOW (ref 12.0–15.0)
MCH: 26.8 pg (ref 26.0–34.0)
MCHC: 32.6 g/dL (ref 30.0–36.0)
MCV: 82.3 fL (ref 78.0–100.0)
Platelets: 238 10*3/uL (ref 150–400)
RBC: 3.28 MIL/uL — ABNORMAL LOW (ref 3.87–5.11)
RDW: 14.5 % (ref 11.5–15.5)
WBC: 6 10*3/uL (ref 4.0–10.5)

## 2016-12-29 LAB — PROCALCITONIN: Procalcitonin: 0.25 ng/mL

## 2016-12-29 LAB — GLUCOSE, CAPILLARY: Glucose-Capillary: 81 mg/dL (ref 65–99)

## 2016-12-29 MED ORDER — POTASSIUM CHLORIDE CRYS ER 20 MEQ PO TBCR: 40.0000 meq | EXTENDED_RELEASE_TABLET | Freq: Once | ORAL | Status: DC

## 2016-12-29 MED ORDER — ASPIRIN 81 MG PO TBEC: 81.0000 mg | DELAYED_RELEASE_TABLET | Freq: Every day | ORAL | 3 refills | Status: DC

## 2016-12-29 MED ORDER — POTASSIUM CHLORIDE CRYS ER 20 MEQ PO TBCR
20.0000 meq | EXTENDED_RELEASE_TABLET | Freq: Once | ORAL | Status: AC
  Administered 2016-12-29: 20 meq via ORAL
  Filled 2016-12-29: qty 1

## 2016-12-29 MED ORDER — FUROSEMIDE 10 MG/ML IJ SOLN: 20.0000 mg | Freq: Once | INTRAMUSCULAR | Status: DC

## 2016-12-29 MED ORDER — POTASSIUM CHLORIDE CRYS ER 20 MEQ PO TBCR
40.0000 meq | EXTENDED_RELEASE_TABLET | Freq: Once | ORAL | Status: AC
  Administered 2016-12-29: 40 meq via ORAL
  Filled 2016-12-29: qty 2

## 2016-12-29 MED ORDER — COLCHICINE 0.6 MG PO TABS: 0.6000 mg | ORAL_TABLET | Freq: Two times a day (BID) | ORAL | 0 refills | Status: DC

## 2016-12-29 MED ORDER — FUROSEMIDE 20 MG PO TABS: 20.0000 mg | ORAL_TABLET | Freq: Every day | ORAL | 11 refills | Status: DC

## 2016-12-29 MED ORDER — IBUPROFEN 400 MG PO TABS: 400.0000 mg | ORAL_TABLET | Freq: Two times a day (BID) | ORAL | 0 refills | Status: AC

## 2016-12-29 MED ORDER — FUROSEMIDE 10 MG/ML IJ SOLN
40.0000 mg | Freq: Once | INTRAMUSCULAR | Status: AC
  Administered 2016-12-29: 40 mg via INTRAVENOUS
  Filled 2016-12-29: qty 4

## 2016-12-29 NOTE — Consult Note (Addendum)
Name: Sherri Ramirez MRN: [id number] DOB: 1955-08-25    ADMISSION DATE:  12/25/2016 CONSULTATION DATE:  12/29/16 REFERRING MD :  Dr. Sharl MaLama / [facility]   CHIEF COMPLAINT:  Chest Pain, SOB    HISTORY OF PRESENT ILLNESS:  61 y/o F (quit 2003), admitted on 11/26 with reports of a 24 hour history of nausea, chest pain, palpitations and shortness of breath.    Initial workup found her to be in atrial fibrillation with RVR (160s).  Initial chest x-ray demonstrated no acute infiltrate with mild bibasilar atelectasis & minimal pleural effusions.  Admitted for further evaluation.  Patient was seen by cardiology.  She reported as chest pain was nonexertional, worse with deep inspiration and position changes particularly when laying on the right side.  Patient started on IV Cardizem and IV heparin.  She ultimately required amiodarone due to persistent elevated heart rate.  She ultimately converted back to normal sinus rhythm.  2D echocardiogram demonstrated LVEF of 60-65% with no wall motion abnormalities, aortic valve sclerosis, mild MR, mild TR, PA peak pressure 26 mmHg.  Cardiology recommendations were to treat the patient for acute pericarditis with ibuprofen 400 mg p.o. for 5 days and colchicine 0.6 mg p.o. twice daily.  She had a temperature of 99.1 and repeat CXR was evaluated with concerns of a possible pneumonia on 11/29.    PCCM consulted for evaluation of pneumonia    PAST MEDICAL HISTORY :   has a past medical history of MS (multiple sclerosis) (HCC) and Seizure (HCC).   has a past surgical history that includes Hysterotomy and Abdominal hysterectomy.  Prior to Admission medications   Medication Sig Start Date End Date Taking? Authorizing Provider  Interferon Beta-1a (REBIF REBIDOSE) 44 MCG/0.5ML SOAJ Inject 44 mcg into the skin 3 (three) times a week. 09/27/16  Yes Nilda RiggsMartin, Nancy Carolyn, NP  phenytoin (DILANTIN) 100 MG ER capsule TAKE 3 CAPSULES EVERY EVENING 09/28/16  Yes Nilda RiggsMartin, Nancy Carolyn,  NP    Allergies  Allergen Reactions  . Carbamazepine   . Glatiramer Acetate     REACTION: anaphylaxis    FAMILY HISTORY:  family history includes Colon cancer in her mother; Heart attack in her father.  SOCIAL HISTORY:  reports that she has quit smoking. she has never used smokeless tobacco. She reports that she does not drink alcohol or use drugs.  REVIEW OF SYSTEMS:  POSITIVES IN BOLD Constitutional: Negative for fever, chills, weight loss, malaise/fatigue and diaphoresis.  HENT: Negative for hearing loss, ear pain, nosebleeds, congestion, sore throat, neck pain, tinnitus and ear discharge.   Eyes: Negative for blurred vision, double vision, photophobia, pain, discharge and redness.  Respiratory: Negative for cough, hemoptysis, sputum production, shortness of breath, wheezing and stridor.   Cardiovascular: Negative for chest pain, palpitations, orthopnea, claudication, leg swelling and PND.  Gastrointestinal: Negative for heartburn, nausea, vomiting, abdominal pain, diarrhea, constipation, blood in stool and melena.  Genitourinary: Negative for dysuria, urgency, frequency, hematuria and flank pain.  Musculoskeletal: Negative for myalgias, back pain, joint pain and falls.  Skin: Negative for itching and rash.  Neurological: Negative for dizziness, tingling, tremors, sensory change, speech change, focal weakness, seizures, loss of consciousness, weakness and headaches.  Endo/Heme/Allergies: Negative for environmental allergies and polydipsia. Does not bruise/bleed easily.  SUBJECTIVE:   VITAL SIGNS: Temp:  [97.4 F (36.3 C)-99.1 F (37.3 C)] 98.1 F (36.7 C) (11/30 0455) Pulse Rate:  [71-91] 71 (11/30 0455) Resp:  [18] 18 (11/30 0455) BP: (110-124)/(71-77) 118/71 (11/30 0455) SpO2:  [  98 %-100 %] 98 % (11/30 0455)  PHYSICAL EXAMINATION: General: thin adult female in no acute distress HEENT: MM pink/moist, no jvd PSY: calm / appropriate Neuro: AAOx4, speech clear, MAE  CV:  s1s2 rrr, no m/r/g, ? rub PULM: even/non-labored, lungs bilaterally clear 3 ZO:XWRU, non-tender, bsx4 active  Extremities: warm/dry, no edema  Skin: no rashes or lesions   Recent Labs  Lab 12/27/16 0421 12/28/16 0433 12/29/16 0423  NA 137 138 138  K 4.4 3.9 3.4*  CL 110 107 106  CO2 22 26 24   BUN 15 11 13   CREATININE 0.58 0.60 0.60  GLUCOSE 104* 107* 93    Recent Labs  Lab 12/27/16 0421 12/28/16 0433 12/29/16 0423  HGB 8.6* 9.3* 8.8*  HCT 26.3* 28.6* 27.0*  WBC 6.1 7.4 6.0  PLT 173 215 238    Dg Chest 2 View  Result Date: 12/28/2016 CLINICAL DATA:  Fever EXAM: CHEST  2 VIEW COMPARISON:  Chest x-ray of 12/25/2016 FINDINGS: There are bilateral pleural effusions present which have increased somewhat in size. There is also more opacity at the left lung base suspicious for left lower lobe pneumonia. A mild degree of pulmonary vascular congestion cannot be excluded. The heart is mildly enlarged. No bony abnormality is seen. IMPRESSION: 1. Increasing small bilateral pleural effusions with increasing basilar atelectasis. 2. Increased opacity at the left lung base suspicious for pneumonia. 3. Cannot exclude mild superimposed pulmonary vascular congestion. Electronically Signed   By: Dwyane Dee M.D.   On: 12/28/2016 11:19      SIGNIFICANT EVENTS  11/26  Admit with SOB, chest pain, AFwRVR 11/30  PCCM consulted for possible PNA   STUDIES:  ECHO 11/27 >> LVEF 60-65%, aortic sclerosis, mild MR, mild TR, PA peak  ANTIBIOTICS Cefepime 11/29 >> 11/30   ASSESSMENT / PLAN:  Discussion: 61 year old female with a history of MS admitted on 11/26 with complaints of shortness of breath, palpitations and chest pain.  Patient was found to have atrial fibrillation with RVR and acute pericarditis.  Initial chest x-ray on 11/26 demonstrated no evidence of acute infiltrate.  Images were repeated on 11/29 in the setting of low-grade fever.  CXR shows very small pleural effusions but no  evidence of organized infiltrate.  Procalcitonin is negative at 0.25.    Small Bilateral Pleural Effusion - suspect secondary to [facility] No evidence of PNA on CXR - negative PCT, no associated symptoms  P: Lasix 40 mg IV x1 KCL 40 mEq x1  No role for thoracentesis  Recommend follow up CXR with PCP in one week  No further abx at this time   Atrial Fibrillation with RVR Acute Pericarditis  P: Per Cardiology    PCCM will be available PRN.  Please call back if new needs arise.  Patient is ok for discharge from a pulmonary perspective.    Canary Brim, NP-C [facility] Pgr: [phone] or if no answer [phone] 12/29/2016, 10:27 AM  STAFF NOTE: I, Rory Percy, MD FACP have personally reviewed patient's available data, including medical history, events of note, physical examination and test results as part of my evaluation. I have discussed with resident/NP and other care providers such as pharmacist, RN and RRT. In addition, I personally evaluated patient and elicited key findings of: in bed flat, no distress, jvd evident, lung sounds slight coarse and crackles bases for sure with inspiration, min edema, pcxr which I reviewed shows pulm edema and bilateral bases effusions, she was pos half liter  yesterday and then now slight neg 200 cc today, I dont see that she has had effective diuresis with lasix, no significant fevers, assessing pCT for lrti, if neg (less 0.26), LRTI indication) would have more confidence supporting clinical;y that there is NO PNA present, dc vanc, consider dc all abx, mild fever could be similar to etiology pericarditis or atx in this pt, would favor increased diuresis, and need ambulatory pulse ox, control of HR important for development acute diastolic dysfxn, if she does not desat, she could go home with lasix directed by cardiology with close follow up, supp k with hypokalemia, I updated the pt in full   Mcarthur Rossetti. Tyson Alias, MD, FACP Pgr:  [phone] [facility] 12/29/2016 1:06 PM

## 2016-12-29 NOTE — Progress Notes (Signed)
Went over discharge paperwork with patient.  All questions answered.  AVS given to patient.  VSS.  Pt wheeled out by NT.

## 2016-12-29 NOTE — Discharge Summary (Addendum)
Physician Discharge Summary  Sherri Ramirez YTK:[id number] DOB: 1955-09-28 DOA: 12/25/2016  PCP: Andi Devon, MD  Admit date: 12/25/2016 Discharge date: 12/29/2016  Time spent: 35* minutes  Recommendations for Outpatient Follow-up:  1. Follow up cardiology in 2 weeks 2. Check CBC in 2 weeks   Discharge Diagnoses:  Principal Problem:   Atrial flutter with rapid ventricular response (HCC) Active Problems:   Multiple sclerosis (HCC)   Seizure disorder, complex partial (HCC)   Typical atrial flutter (HCC)   Chest pain   Contraindication to anticoagulation therapy   Acute idiopathic pericarditis   SOB (shortness of breath)   Discharge Condition: Stable  Diet recommendation: heart healthy diet  Filed Weights   12/25/16 1907 12/26/16 0000  Weight: 43.1 kg (95 lb) 44 kg (97 lb 0 oz)    History of present illness:  61 y.o.femalewith medical history significant ofMS, Seizure, whopresents with chest pain, shortness of breath and palpitation. She was found to be in new onset afib with RVR with rate in 160's.Chest x-ray has no infiltration,withmild bilateral basilar atelectasis and minimal pleural effusion. Patient is admitted to stepdown as inpatient. Cardiology was consulted.    Hospital Course:  1. New onset atrial flutter with RVR-converted to sinus rhythm overnight.  IV Cardizem and amiodarone have been stopped.  Patient's chads 2 vasc score is 1, started on aspirin for anticoagulation.  Cardiology will follow up as outpatient. 2. Healthcare associated pneumonia-chest x-ray shows left lower lobe infiltrate.  patient was started on vancomycin and cefepime.  Consulted pulmonary, and they do not feel that clinically she has pneumonia. Pulmonary recommends to discontinue all antibiotics,  continue with Po lasix on discharge due to Pulmonary edema from diastolic dysfunction. 3. Mild pulmonary edema/ acute diastolic CHF-improved with diuresis. O2 sats are 100% on RA and on  ambulation.Will discharge home on Po Lasix 20 mg daily. 4. Pericarditis/chest pain-patient started on colchicine and NSAIDs per cardiology. Will give prescription for two more days. 5. Seizure disorder-ferritin level subtherapeutic, 1 dose of IV phenytoin  250 mg given.  Continue home medications. 6. Anemia-patient's hemoglobin dropped to 8.6, iron saturation 19%.  Today hemoglobin is 8.8, likely dilutional improved after diuresis.  Follow up CBC in 2 weeks as outpatient   7. Hypokalemia- potassium is 3.4 today, will replace potassium before discharge.     Procedures:  None   Consultations:  Cardiology  PCCM  Discharge Exam: Vitals:   12/29/16 0455 12/29/16 1259  BP: 118/71 117/79  Pulse: 71 86  Resp: 18 18  Temp: 98.1 F (36.7 C) 98.6 F (37 C)  SpO2: 98% 98%    General: Appears in  No acute distress Cardiovascular: S1S2, RRR Respiratory: Bibasilar crackles  Discharge Instructions   Discharge Instructions    Diet - low sodium heart healthy   Complete by:  As directed    Increase activity slowly   Complete by:  As directed      Allergies as of 12/29/2016      Reactions   Carbamazepine    Glatiramer Acetate    REACTION: anaphylaxis      Medication List    TAKE these medications   aspirin 81 MG EC tablet Take 1 tablet (81 mg total) by mouth daily. Start taking on:  12/30/2016   colchicine 0.6 MG tablet Take 1 tablet (0.6 mg total) by mouth 2 (two) times daily for 2 days.   furosemide 20 MG tablet Commonly known as:  LASIX Take 1 tablet (20 mg total) by mouth  daily.   ibuprofen 400 MG tablet Commonly known as:  ADVIL,MOTRIN Take 1 tablet (400 mg total) by mouth 2 (two) times daily for 2 days.   Interferon Beta-1a 44 MCG/0.5ML Soaj Commonly known as:  REBIF REBIDOSE Inject 44 mcg into the skin 3 (three) times a week.   phenytoin 100 MG ER capsule Commonly known as:  DILANTIN TAKE 3 CAPSULES EVERY EVENING      Allergies  Allergen Reactions   . Carbamazepine   . Glatiramer Acetate     REACTION: anaphylaxis   Follow-up Information    Dunn, Dayna N, PA-C Follow up.   Specialties:  Cardiology, Radiology Why:  On December 18th at 8:00 am for cardiology hospital follow up. Contact information: [address] [place] [address] [phone]            The results of significant diagnostics from this hospitalization (including imaging, microbiology, ancillary and laboratory) are listed below for reference.    Significant Diagnostic Studies: Dg Chest 2 View  Result Date: 12/28/2016 CLINICAL DATA:  Fever EXAM: CHEST  2 VIEW COMPARISON:  Chest x-ray of 12/25/2016 FINDINGS: There are bilateral pleural effusions present which have increased somewhat in size. There is also more opacity at the left lung base suspicious for left lower lobe pneumonia. A mild degree of pulmonary vascular congestion cannot be excluded. The heart is mildly enlarged. No bony abnormality is seen. IMPRESSION: 1. Increasing small bilateral pleural effusions with increasing basilar atelectasis. 2. Increased opacity at the left lung base suspicious for pneumonia. 3. Cannot exclude mild superimposed pulmonary vascular congestion. Electronically Signed   By: Dwyane DeePaul  Barry M.D.   On: 12/28/2016 11:19   Dg Chest 2 View  Result Date: 12/25/2016 CLINICAL DATA:  Chest pain. EXAM: CHEST  2 VIEW COMPARISON:  None available currently. FINDINGS: The heart size and mediastinal contours are within normal limits. No pneumothorax is noted. Minimal right basilar subsegmental atelectasis is noted with minimal right pleural effusion. Mild left basilar subsegmental atelectasis is noted with mild left pleural effusion. The visualized skeletal structures are unremarkable. IMPRESSION: Minimal to mild bibasilar subsegmental atelectasis and pleural effusions. Electronically Signed   By: Lupita RaiderJames  Green Jr, M.D.   On: 12/25/2016 18:21    Microbiology: Recent Results  (from the past 240 hour(s))  MRSA PCR Screening     Status: None   Collection Time: 12/26/16 12:30 AM  Result Value Ref Range Status   MRSA by PCR NEGATIVE NEGATIVE Final    Comment:        The GeneXpert MRSA Assay (FDA approved for NASAL specimens only), is one component of a comprehensive MRSA colonization surveillance program. It is not intended to diagnose MRSA infection nor to guide or monitor treatment for MRSA infections.   Culture, blood (Routine X 2) w Reflex to ID Panel     Status: None (Preliminary result)   Collection Time: 12/28/16  1:15 PM  Result Value Ref Range Status   Specimen Description BLOOD RIGHT HAND  Final   Special Requests IN PEDIATRIC BOTTLE Blood Culture adequate volume  Final   Culture   Final    NO GROWTH < 24 HOURS Performed at [facility], [address] [place], [place] [address]    Report Status PENDING  Incomplete  Culture, blood (Routine X 2) w Reflex to ID Panel     Status: None (Preliminary result)   Collection Time: 12/28/16  1:15 PM  Result Value Ref Range Status  Specimen Description BLOOD LEFT HAND  Final   Special Requests IN PEDIATRIC BOTTLE Blood Culture adequate volume  Final   Culture   Final    NO GROWTH < 24 HOURS Performed at [facility], [address] [place], [place] [address]    Report Status PENDING  Incomplete     Labs: Basic Metabolic Panel: Recent Labs  Lab 12/25/16 1743 12/25/16 2015 12/26/16 0234 12/27/16 0421 12/28/16 0433 12/29/16 0423  NA 139  --  137 137 138 138  K 4.1  --  3.3* 4.4 3.9 3.4*  CL 101  --  106 110 107 106  CO2 28  --  23 22 26 24   GLUCOSE 129*  --  122* 104* 107* 93  BUN 26*  --  20 15 11 13   CREATININE 0.77  --  0.64 0.58 0.60 0.60  CALCIUM 9.6  --  8.2* 7.9* 8.6* 8.1*  MG  --  1.8  --   --   --   --    Liver Function Tests: Recent Labs  Lab 12/25/16 1743  AST 45*  ALT 32  ALKPHOS 95  BILITOT 0.9  PROT 8.6*  ALBUMIN 3.7   Recent Labs  Lab  12/25/16 1743  LIPASE 24   No results for input(s): AMMONIA in the last 168 hours. CBC: Recent Labs  Lab 12/25/16 1743 12/26/16 0234 12/27/16 0421 12/28/16 0433 12/29/16 0423  WBC 8.8 7.4 6.1 7.4 6.0  NEUTROABS 6.9  --   --   --   --   HGB 13.0 10.2* 8.6* 9.3* 8.8*  HCT 39.2 31.3* 26.3* 28.6* 27.0*  MCV 83.1 82.6 83.0 82.7 82.3  PLT 266 187 173 215 238   Cardiac Enzymes: Recent Labs  Lab 12/25/16 2015 12/26/16 0234 12/26/16 0744  TROPONINI <0.03 <0.03 <0.03   BNP: BNP (last 3 results) Recent Labs    12/26/16 0234  BNP 183.8*    ProBNP (last 3 results) No results for input(s): PROBNP in the last 8760 hours.  CBG: Recent Labs  Lab 12/26/16 0749 12/27/16 0754 12/28/16 0736 12/29/16 0750  GLUCAP 108* 103* 96 81       Signed:  Meredeth Ide MD.  [facility] 12/29/2016, 3:14 PM

## 2016-12-29 NOTE — Progress Notes (Signed)
Progress Note  Patient Name: Sherri Ramirez Date of Encounter: 12/29/2016  Primary Care Provider: Andi DevonShelton, [name], MD Primary Cardiologist: New to Dr. Delton SeeNelson   Subjective   She feels better today, improved breathing.   Inpatient Medications    Scheduled Meds: . aspirin EC  81 mg Oral Daily  . colchicine  0.6 mg Oral BID  . furosemide  20 mg Intravenous Once  . ibuprofen  400 mg Oral BID  . Interferon Beta-1a  44 mcg Subcutaneous Once per day on Mon Wed Fri  . phenytoin  300 mg Oral QHS  . potassium chloride  40 mEq Oral Once   Continuous Infusions: . ceFEPime (MAXIPIME) IV Stopped (12/29/16 0244)  . vancomycin     PRN Meds: acetaminophen, hydrOXYzine, LORazepam, morphine injection, nitroGLYCERIN, zolpidem   Vital Signs    Vitals:   12/28/16 0444 12/28/16 1659 12/28/16 2044 12/29/16 0455  BP: 103/61 110/72 124/77 118/71  Pulse: 85 84 91 71  Resp: 18   18  Temp: 98.6 F (37 C) (!) 97.4 F (36.3 C) 99.1 F (37.3 C) 98.1 F (36.7 C)  TempSrc: Oral Oral Oral Oral  SpO2: 100% 100% 100% 98%  Weight:      Height:        Intake/Output Summary (Last 24 hours) at 12/29/2016 0856 Last data filed at 12/29/2016 0600 Gross per 24 hour  Intake 460 ml  Output 200 ml  Net 260 ml   Filed Weights   12/25/16 1907 12/26/16 0000  Weight: 95 lb (43.1 kg) 97 lb 0 oz (44 kg)    Telemetry    SR - Personally Reviewed  ECG    SR - Personally Reviewed  Physical Exam   GEN: No acute distress.   Neck: No JVD Cardiac: RRR, no murmurs, rubs, or gallops.  Respiratory:carckles at bases B/L  GI: Soft, nontender, non-distended  MS: No edema; No deformity. Neuro:  Nonfocal  Psych: Normal affect   Labs    Chemistry Recent Labs  Lab 12/25/16 1743  12/27/16 0421 12/28/16 0433 12/29/16 0423  NA 139   < > 137 138 138  K 4.1   < > 4.4 3.9 3.4*  CL 101   < > 110 107 106  CO2 28   < > 22 26 24   GLUCOSE 129*   < > 104* 107* 93  BUN 26*   < > 15 11 13   CREATININE 0.77    < > 0.58 0.60 0.60  CALCIUM 9.6   < > 7.9* 8.6* 8.1*  PROT 8.6*  --   --   --   --   ALBUMIN 3.7  --   --   --   --   AST 45*  --   --   --   --   ALT 32  --   --   --   --   ALKPHOS 95  --   --   --   --   BILITOT 0.9  --   --   --   --   GFRNONAA >60   < > >60 >60 >60  GFRAA >60   < > >60 >60 >60  ANIONGAP 10   < > 5 5 8    < > = values in this interval not displayed.     Hematology Recent Labs  Lab 12/27/16 0421 12/28/16 0433 12/29/16 0423  WBC 6.1 7.4 6.0  RBC 3.17* 3.46* 3.28*  HGB 8.6* 9.3* 8.8*  HCT 26.3*  28.6* 27.0*  MCV 83.0 82.7 82.3  MCH 27.1 26.9 26.8  MCHC 32.7 32.5 32.6  RDW 14.6 14.9 14.5  PLT 173 215 238    Cardiac Enzymes Recent Labs  Lab 12/25/16 2015 12/26/16 0234 12/26/16 0744  TROPONINI <0.03 <0.03 <0.03    Recent Labs  Lab 12/25/16 1755  TROPIPOC 0.00     BNP Recent Labs  Lab 12/26/16 0234  BNP 183.8*     DDimer No results for input(s): DDIMER in the last 168 hours.   Radiology    Dg Chest 2 View  Result Date: 12/28/2016 CLINICAL DATA:  Fever EXAM: CHEST  2 VIEW COMPARISON:  Chest x-ray of 12/25/2016 FINDINGS: There are bilateral pleural effusions present which have increased somewhat in size. There is also more opacity at the left lung base suspicious for left lower lobe pneumonia. A mild degree of pulmonary vascular congestion cannot be excluded. The heart is mildly enlarged. No bony abnormality is seen. IMPRESSION: 1. Increasing small bilateral pleural effusions with increasing basilar atelectasis. 2. Increased opacity at the left lung base suspicious for pneumonia. 3. Cannot exclude mild superimposed pulmonary vascular congestion. Electronically Signed   By: Dwyane Dee M.D.   On: 12/28/2016 11:19    Cardiac Studies    EKG:  The EKG was personally reviewed and demonstrates: This rhythm at rate of 85 bpm, ST/T wave abnormality in anterior leads Telemetry:  Telemetry was personally reviewed and demonstrates: Sinus rhythm at  controlled ventricular rate     Patient Profile     61 y.o. female   Assessment & Plan    1. New onset atrial flutter with rapid ventricular rate 2. Chest pain 3.  Abnormal thyroid function test 4.  Right-sided flank pain 5.  Acute anemia 6. Hypokalemia  61 y.o. female with a hx of seizure and multiple sclerosis who is being seen today for the evaluation of atrial flutter, she is new to [place], no prior cardiac history.  History of 15 pack year tobacco abuse.  Quit approximately 15 years ago.  Father died of MI at age 71. She presented with chest pain and shortness of breath, on and off, non-exertional, worse with deep inspiration and with position changes, worse on the right side. Tachycardic at her PCP office yesterday. EKG reveals atrial fibrillation at a rapid ventricular rate of 160s.  Not felt palpitation.  Patient denies of orthopnea, PND, syncope, dizziness, headache, lower extremity edema, melena or blood in his stool or urine.   Echo showed - LVEF 60-65%, normal wall thickness, normal wall motion, normal diastolic function, aortic valve sclerosis, mild MR, moderate LAE, mild TR, RVSP 26 mmHg, normal IVC. The patient was admitted and started on IV Cardizem and IV heparin.  Due to persistent elevated heart rate, patient was started on amiodarone after discussion with Dr. Shirlee Latch.  She is converted to sinus rhythm overnight.  Now off amiodarone and Cardizem.  She is back to her baseline. On physical exam she appears frail, has mild systolic murmur, no rub lungs CTA, no LE edema.   She is rather hypotensive and HR in 90'. I would not start any antiarrhythmics given hypotension, if her a-flutter recurs we will consider an ablation. CHADS-VASc is one, I would start aspirin 81 mg po daily only. She would be a difficult anticoagulation candidate given anemia, interaction with phenytoin, fall risk.  Potassium is low, replaced yesterday, now 4.4. She remains in SR.  She appears euvolemic,  I would discontinue lasix. She is stable  for a discharge from cardiac standpoint. We will arrange for an outpatient follow up.  For questions or updates, please contact [facility] Please consult [email or web address] for contact info under Cardiology/STEMI.    Signed, Tobias Alexander, MD  12/29/2016, 8:56 AM

## 2017-01-02 LAB — CULTURE, BLOOD (ROUTINE X 2)
Culture: NO GROWTH
Culture: NO GROWTH
Special Requests: ADEQUATE
Special Requests: ADEQUATE

## 2017-01-10 ENCOUNTER — Encounter: Payer: Self-pay | Admitting: Physician Assistant

## 2017-01-16 ENCOUNTER — Ambulatory Visit: Payer: Managed Care, Other (non HMO) | Admitting: Physician Assistant

## 2017-01-16 ENCOUNTER — Encounter: Payer: Self-pay | Admitting: Physician Assistant

## 2017-01-16 ENCOUNTER — Ambulatory Visit (INDEPENDENT_AMBULATORY_CARE_PROVIDER_SITE_OTHER): Payer: Managed Care, Other (non HMO) | Admitting: Physician Assistant

## 2017-01-16 VITALS — BP 110/68 | HR 91 | Ht 63.0 in | Wt 94.8 lb

## 2017-01-16 DIAGNOSIS — I309 Acute pericarditis, unspecified: Secondary | ICD-10-CM | POA: Diagnosis not present

## 2017-01-16 DIAGNOSIS — I4892 Unspecified atrial flutter: Secondary | ICD-10-CM

## 2017-01-16 DIAGNOSIS — E876 Hypokalemia: Secondary | ICD-10-CM | POA: Diagnosis not present

## 2017-01-16 DIAGNOSIS — R9431 Abnormal electrocardiogram [ECG] [EKG]: Secondary | ICD-10-CM | POA: Diagnosis not present

## 2017-01-16 DIAGNOSIS — D649 Anemia, unspecified: Secondary | ICD-10-CM

## 2017-01-16 LAB — BASIC METABOLIC PANEL
BUN/Creatinine Ratio: 27 (ref 12–28)
BUN: 21 mg/dL (ref 8–27)
CO2: 28 mmol/L (ref 20–29)
Calcium: 9.6 mg/dL (ref 8.7–10.3)
Chloride: 102 mmol/L (ref 96–106)
Creatinine, Ser: 0.78 mg/dL (ref 0.57–1.00)
GFR calc Af Amer: 96 mL/min/{1.73_m2} (ref >59)
GFR calc non Af Amer: 83 mL/min/{1.73_m2} (ref >59)
Glucose: 99 mg/dL (ref 65–99)
Potassium: 4.9 mmol/L (ref 3.5–5.2)
Sodium: 141 mmol/L (ref 134–144)

## 2017-01-16 LAB — CBC
Hematocrit: 31.2 % — ABNORMAL LOW (ref 34.0–46.6)
Hemoglobin: 10.3 g/dL — ABNORMAL LOW (ref 11.1–15.9)
MCH: 25.6 pg — ABNORMAL LOW (ref 26.6–33.0)
MCHC: 33 g/dL (ref 31.5–35.7)
MCV: 78 fL — ABNORMAL LOW (ref 79–97)
Platelets: 245 10*3/uL (ref 150–379)
RBC: 4.02 x10E6/uL (ref 3.77–5.28)
RDW: 14.3 % (ref 12.3–15.4)
WBC: 5.1 10*3/uL (ref 3.4–10.8)

## 2017-01-16 LAB — SEDIMENTATION RATE: Sed Rate: 48 mm/hr — ABNORMAL HIGH (ref 0–40)

## 2017-01-16 MED ORDER — COLCHICINE 0.6 MG PO TABS: 0.6000 mg | ORAL_TABLET | Freq: Two times a day (BID) | ORAL | 3 refills | Status: DC

## 2017-01-16 MED ORDER — COLCHICINE 0.6 MG PO TABS: 0.6000 mg | ORAL_TABLET | Freq: Every day | ORAL | 1 refills | Status: DC

## 2017-01-16 NOTE — Progress Notes (Addendum)
Cardiology Office Note    Date:  01/16/2017  ID:  Sherri Ramirez, DOB 1955-04-23, MRN [id number] PCP:  Willey Blade, MD  Cardiologist:  Dr. Meda Coffee   Chief Complaint: atrial flutter  History of Present Illness:  Sherri Ramirez is a 61 y.o. female with history of multiple sclerosis, seizures (last seizure in the 1990s), remote tobacco abuse, recently diagnosed atrial flutter, possible volume overload, pericarditis, hypokalemia, anemia who presents for post-hospital follow-up.   She was admitted 12/25/16 with chest pain, SOB, chills, and tachycardia. Fever is also listed in admitting dx. Cardiology consult note 12/26/16 indicates consult was for atrial flutter. The note also uses term atrial fib as well in reference to her EKGs which by my eye appear to be atrial flutter. She was started on IV cardizem and then eventually IV amiodarone due to persistently elevated HR. She converted to NSR. She also reported chest pain with inspiration and was felt to have pericarditis. Dr. Meda Coffee recommended ibuprofen x 5 days and colchicine 0.58m BID. She was treated with IV Lasix for possible volume overload - BNP was 183, CXR with small effusions and possible PNA. She was started on aspirin only daily as her CHADSVASC was felt to be one for female only. Dr. NMeda Coffeefelt she would be a difficult anticoagulation candidate given anemia, interaction with phenytoin, fall risk thus recommended aspirin only. (Per discussion with Dr NMeda Coffeein clinic today, her volume overload was felt related more to rapid atrial flutter than actual heart failure.)  2D Echo 12/27/15 showed normal EF 644-81% normal diastolic function, aortic sclerosis, moderate LAE, mild MR, RVSP 26. F/u tracings while in NSR showed pronounced TWI V2-V4. She was initially felt to have PNA but this was later refuted. Of note, her hemoglobin during admission went from baseline of 13 to 8.8 at discharge. Cause was not defined. Troponin was negative, LDL 109, TSH  wnl, minimally elevated free T4 and low free T3. She followed up with PCP Dr. SKarlton Lemonon 01/10/17 at which time sodium was 145, Cr 0.83, glucose 107, Hgb back to 11.1, Hct 33.8. She was prescribed antibiotics for presumed URI which she has not yet started.   She returns for follow-up today alone. She has somewhat of a skeptical demeanor and was initially wary of being weighed or having EKG. She did not wish to review her EKG in clinic today. She continues to feel generally weak but this is not new for her. She gets "tired" with exertion but again, this is not new. She does not have angina with exertion but is unable to really specify if there is dyspnea. She continues to have mild left sided chest discomfort with inspiration. She did not get the colchicine at the pharmacy as she said it was not covered because she didn't have gout. She didn't notice much difference with the ibuprofen. She denies any palpitations, syncope, dizziness, edema, leg pain, personal/family history of clot, recent travel, surgery, or bedrest (aside from recent admission). No bleeding of any kind noted. + family history of colon CA in mother, supposed to get colonoscopy every 5 yrs - last one was about that long ago.   Past Medical History:  Diagnosis Date  . Acute diastolic CHF (congestive heart failure) ([facility]    a. in setting of atrial flutter 11/2016. Echo actually showed normal EF and normal diastolic function so was likely due to atrial flutter.  . Anemia    a. drop from from 12->8.6 in 11/2016.  . Atrial flutter,  paroxysmal ([place])    a. dx 11/2016.  Marland Kitchen Former tobacco use   . Hypokalemia   . MS (multiple sclerosis) ([place])   . Pericarditis    a. dx 11/2016.  . Seizure [facility])     Past Surgical History:  Procedure Laterality Date  . ABDOMINAL HYSTERECTOMY    . HYSTEROTOMY      Current Medications: Current Meds  Medication Sig  . aspirin EC 81 MG EC tablet Take 1 tablet (81 mg total) by mouth daily.  . furosemide  (LASIX) 20 MG tablet Take 1 tablet (20 mg total) by mouth daily.  . Interferon Beta-1a (REBIF REBIDOSE) 44 MCG/0.5ML SOAJ Inject 44 mcg into the skin 3 (three) times a week.  . phenytoin (DILANTIN) 100 MG ER capsule TAKE 3 CAPSULES EVERY EVENING     Allergies:   Carbamazepine and Glatiramer acetate   Social History   Socioeconomic History  . Marital status: Married    Spouse name: [name]  . Number of children: 0  . Years of education: None  . Highest education level: None  Social Needs  . Financial resource strain: None  . Food insecurity - worry: None  . Food insecurity - inability: None  . Transportation needs - medical: None  . Transportation needs - non-medical: None  Occupational History  . None  Tobacco Use  . Smoking status: Former Research scientist (life sciences)  . Smokeless tobacco: Never Used  Substance and Sexual Activity  . Alcohol use: No  . Drug use: No  . Sexual activity: None  Other Topics Concern  . None  Social History Narrative   Patient is married and does not work.    Patient quit smoking in 2007.     Family History:  Family History  Problem Relation Age of Onset  . Colon cancer Mother   . Heart attack Father     ROS:   Please see the history of present illness.  All other systems are reviewed and otherwise negative.    PHYSICAL EXAM:   VS:  BP 110/68   Pulse 91   Ht 5' 3"  (1.6 m)   Wt 94 lb 12.8 oz (43 kg)   SpO2 99%   BMI 16.79 kg/m   BMI: Body mass index is 16.79 kg/m. GEN: Frail appearing AAF, in no acute distress  HEENT: normocephalic, atraumatic Neck: no JVD, carotid bruits, or masses Cardiac: RRR; no murmurs, rubs, or gallops, no edema  Respiratory:  clear to auscultation bilaterally, normal work of breathing GI: soft, nontender, nondistended, + BS MS: no deformity or atrophy  Skin: warm and dry, no rash Neuro:  Alert and Oriented x 3, skeptical/reserved affect  Wt Readings from Last 3 Encounters:  01/16/17 94 lb 12.8 oz (43 kg)  12/26/16 97  lb 0 oz (44 kg)  09/27/16 102 lb 6.4 oz (46.4 kg)      Studies/Labs Reviewed:   EKG:  EKG was ordered today and personally reviewed by me and demonstrates NSR 91bpm, possible LAE, TWI V1-V3 similar to recent tracing, nonspecific flattening III  Recent Labs: 12/25/2016: ALT 32; Magnesium 1.8; TSH 0.828 12/26/2016: B Natriuretic Peptide 183.8 12/29/2016: BUN 13; Creatinine, Ser 0.60; Hemoglobin 8.8; Platelets 238; Potassium 3.4; Sodium 138   Lipid Panel    Component Value Date/Time   CHOL 165 12/26/2016 0234   TRIG 67 12/26/2016 0234   HDL 43 12/26/2016 0234   CHOLHDL 3.8 12/26/2016 0234   VLDL 13 12/26/2016 0234   LDLCALC 109 (H) 12/26/2016 0234  Additional studies/ records that were reviewed today include: Summarized above.    ASSESSMENT & PLAN:   1. Paroxysmal atrial flutter - maintaining NSR without any agents on board. She is traditionally hypotensive so was not discharged on any rate controlling medicines. With h/o generalized weakness, would not push the issue today. I reviewed her CHADSVASC score with Dr. Meda Coffee today given recent volume overload. Dr. Meda Coffee feels this was more related to acute illness/atrial flutter than acute CHF thus she continues to feel CHADSVASC is 1 for female, warranting aspirin only. Furthermore she is not an ideal candidate for anticoagulation given her frailty, weakness from MS, and recent anemia issues. Discussed continued surveillance for recurrent tachypalpitations. Recent sodium increased to 145. She does not appear volume overloaded. Will stop Lasix. 2. Pleuritic chest pain/abnormal EKG - she is not tachycardic or hypoxic, but continues to have mild chest pain with inspiration. She does not feel ibuprofen really helped her symptoms recently in the hospital. I d/w Dr. Meda Coffee. Will check d-dimer. If abnormal, will need CTA to rule out PE. Furthermore, will add ESR given recent presumed pericarditis. Per d/w MD, will tentatively re-initiate  colchicine at 0.76m BID x 3 months for pericarditis (patient to begin if workup is negative for PE). Her EKG in the hospital showed impressive anterior TW changes suspicious for anemia. Her symptoms, however, are less typical for this. Once more information is known whether she will need a CTA to exclude PE, will also plan to arrange coronary CTA to evaluate coronary anatomy as well - Dr. NMeda Coffeewishes to be the reader of this. Will update BMET for purposes of possible need for contrast. Addendum: d-dimer has not yet resulted. I've signed out to APP on call this evening to be aware that stat result is forthcoming. If this is positive she will need to be called and asked to go to the ER for evaluation with CT angio to exclude PE since it is now after hours. If negative I will address in AM about proceeding with cardiac CT. 3. New anemia - further evaluation per PCP. Her hemoglobin level was down to 8.6 in the hospital but has come back up to 11.1 by f/u labwork by Dr. SKarlton Lemon She has not had any evidence of bleeding but I advised she discuss further evaluation with Dr. SKarlton Lemon 4. Hypokalemia - improved to 4.6 by repeat labs at PCP's office.  Disposition: F/u with Dr. NMeda Coffeein 4 weeks.   Medication Adjustments/Labs and Tests Ordered: Current medicines are reviewed at length with the patient today.  Concerns regarding medicines are outlined above. Medication changes, Labs and Tests ordered today are summarized above and listed in the Patient Instructions accessible in Encounters.   Signed, DCharlie Pitter PA-C  01/16/2017 2:25 PM    [facility] [address] [place]  [id number]: [phone] Fax: [phone]

## 2017-01-16 NOTE — Patient Instructions (Addendum)
Medication Instructions:  Your physician has recommended you make the following change in your medication:  1.  STOP the Lasix 2.  START Colchicine 0.6 mg taking 1 tablet twice a day  Labwork: TODAY:  STAT:  BMET, DDIMER, & ESR  Testing/Procedures: None ordered  Follow-Up: Your physician recommends that you schedule a follow-up appointment in: [place]   Any Other Special Instructions Will Be Listed Below (If Applicable).    If you need a refill on your cardiac medications before your next appointment, please call your pharmacy.  `

## 2017-01-17 ENCOUNTER — Ambulatory Visit (INDEPENDENT_AMBULATORY_CARE_PROVIDER_SITE_OTHER)
Admission: RE | Admit: 2017-01-17 | Discharge: 2017-01-17 | Disposition: A | Discharge status: Home / Self Care | Payer: Managed Care, Other (non HMO) | Source: Ambulatory Visit | Attending: Cardiology | Admitting: Cardiology

## 2017-01-17 ENCOUNTER — Other Ambulatory Visit: Payer: Self-pay | Admitting: *Deleted

## 2017-01-17 ENCOUNTER — Other Ambulatory Visit: Payer: Self-pay | Admitting: Physician Assistant

## 2017-01-17 ENCOUNTER — Telehealth: Payer: Self-pay | Admitting: Physician Assistant

## 2017-01-17 DIAGNOSIS — R0781 Pleurodynia: Secondary | ICD-10-CM

## 2017-01-17 LAB — D-DIMER, QUANTITATIVE: D-DIMER: 4.71 mg/L FEU — ABNORMAL HIGH (ref 0.00–0.49)

## 2017-01-17 MED ORDER — IOPAMIDOL (ISOVUE-370) INJECTION 76%
80.0000 mL | Freq: Once | INTRAVENOUS | Status: AC | PRN
  Administered 2017-01-17: 80 mL via INTRAVENOUS

## 2017-01-17 MED ORDER — COLCRYS 0.6 MG PO TABS: 0.6000 mg | ORAL_TABLET | Freq: Every day | ORAL | 0 refills | Status: DC

## 2017-01-17 NOTE — Telephone Encounter (Signed)
Pt's pharmacy needed a new Rx sent because pt's insurance covers brand name, so I resent brand name colcrys 0.6 mg tablets into pt's pharmacy in order for pt's insurance to cover it. Confirmation received.

## 2017-01-17 NOTE — Telephone Encounter (Signed)
D-dimer was positive at 4.71. I attempted to call patient twice to ask her to report to the ER for a CTA. Was unable to reach anyone by phone. I will try again later today.

## 2017-01-18 ENCOUNTER — Telehealth: Payer: Self-pay | Admitting: *Deleted

## 2017-01-18 DIAGNOSIS — R7989 Other specified abnormal findings of blood chemistry: Secondary | ICD-10-CM

## 2017-01-18 DIAGNOSIS — R9431 Abnormal electrocardiogram [ECG] [EKG]: Secondary | ICD-10-CM

## 2017-01-18 NOTE — Telephone Encounter (Signed)
Spoke with the pt and informed the her that being her CT Angio was negative for PE, Dayna would like to proceed with ordering for her to have a coronary CT done, to exclude CAD given her abnormal EKG.  Also informed the pt that she needs to follow-up with her PCP to discuss further work-up and evaluation.  Informed the pt that I will place the order for her cardiac ct done, and [facility] a message to call the pt back to arrange this appt, once pre-certed.  Informed the pt that instructions will then be provided at that time.  Pt verbalized understanding and agrees with this plan.

## 2017-01-18 NOTE — Telephone Encounter (Signed)
INotes recorded by Laurann Montana, PA-C on 01/17/2017 at 8:12 AM EST Patient seen in clinic yesterday for f/u of atrial flutter, continuing to note pleuritic chest pain. I had spoken with Dr. Delton See and our plan was to obtain d-dimer and if positive, perform CT angio to rule out PE, and also try to piggyback with coronary CT scan. However, given how high this d-dimer is and her recent symptoms, I worry that if we try to arrange both studies at the same time, it will delay the CT angio to rule out PE because the coronary CTs take time to pre-cert and arrange. Please contact patient and arrange CT angio to exclude PE STAT. If unable to reach the patient by phone, please try emergency contacts. If unable to reach her that way, would recommend sending [facility] to address for safety check given how elevated the d-dimer is. If this study is positive for pulmonary embolism, she will need to go to the emergency room for readmission by internal medicine for further treatment and workup. If CT angio is negative for PE, please arrange coronary CTA to exclude coronary artery disease given her abnormal EKG.   Her blood count also appears to be slightly depressed again. It dropped to 8.6 recently in the hospital but was up to 11.1 on 01/10/17. It is back down in the 10 range again and she needs to contact her PCP to discuss further workup and evaluation.  I am out of the office today so if there are any new acute issues that arise, this is a patient of Dr. Lindaann Slough and she is in the office today. Azalee Course PA-C will be covering my box from thereafter through 01/23/17.  Sherri Dunn PA-C

## 2017-02-02 ENCOUNTER — Telehealth: Payer: Self-pay | Admitting: Cardiology

## 2017-02-02 NOTE — Telephone Encounter (Signed)
Proceed with CT

## 2017-02-02 NOTE — Telephone Encounter (Signed)
Returned call to patient. Patient states that she has been having this feeling like her heart is skipping a beat along with some fatigue and would like to know when her CT scan will be scheduled. Patient states she has been feeling fatigue since she was admitted to the hospital. Patient denies any chest pain, dizziness or syncope. Informed patient that I will forward to the schedulers since insurance has approved CT scan. Patient verbalized understanding and thanked me for the call.

## 2017-02-02 NOTE — Telephone Encounter (Signed)
New message     Patient calling about when she is sitting if feels like her breathe is getting caught and she has to take a double breathe. She is also getting pain in her right side, she does not notice any other symptoms, but she is concerned and would like to having test to find out what is wrong.   And she is getting pain when she chews off and on.

## 2017-02-02 NOTE — Telephone Encounter (Signed)
Pts coronary ct is scheduled for 02/13/17.  Pt made aware of appt date and time by [facility] scheduling.

## 2017-02-13 ENCOUNTER — Ambulatory Visit (HOSPITAL_COMMUNITY)
Admission: RE | Admit: 2017-02-13 | Discharge: 2017-02-13 | Disposition: A | Discharge status: Home / Self Care | Payer: Managed Care, Other (non HMO) | Source: Ambulatory Visit | Attending: Cardiology | Admitting: Cardiology

## 2017-02-13 ENCOUNTER — Ambulatory Visit (HOSPITAL_COMMUNITY): Payer: Managed Care, Other (non HMO)

## 2017-02-13 DIAGNOSIS — J90 Pleural effusion, not elsewhere classified: Secondary | ICD-10-CM | POA: Diagnosis not present

## 2017-02-13 DIAGNOSIS — R9431 Abnormal electrocardiogram [ECG] [EKG]: Secondary | ICD-10-CM

## 2017-02-13 DIAGNOSIS — R7989 Other specified abnormal findings of blood chemistry: Secondary | ICD-10-CM | POA: Insufficient documentation

## 2017-02-13 DIAGNOSIS — R0789 Other chest pain: Secondary | ICD-10-CM | POA: Diagnosis not present

## 2017-02-13 DIAGNOSIS — I700 Atherosclerosis of aorta: Secondary | ICD-10-CM | POA: Insufficient documentation

## 2017-02-13 MED ORDER — IOPAMIDOL (ISOVUE-370) INJECTION 76%
INTRAVENOUS | Status: AC
  Administered 2017-02-13: 80 mL
  Filled 2017-02-13: qty 100

## 2017-02-13 MED ORDER — NITROGLYCERIN 0.4 MG SL SUBL
0.8000 mg | SUBLINGUAL_TABLET | Freq: Once | SUBLINGUAL | Status: AC
  Administered 2017-02-13: 0.8 mg via SUBLINGUAL
  Filled 2017-02-13: qty 25

## 2017-02-13 MED ORDER — NITROGLYCERIN 0.4 MG SL SUBL
SUBLINGUAL_TABLET | SUBLINGUAL | Status: AC
  Administered 2017-02-13: 0.8 mg via SUBLINGUAL
  Filled 2017-02-13: qty 2

## 2017-02-13 MED ORDER — METOPROLOL TARTRATE 5 MG/5ML IV SOLN
INTRAVENOUS | Status: AC
  Administered 2017-02-13: 5 mg via INTRAVENOUS
  Filled 2017-02-13: qty 15

## 2017-02-13 MED ORDER — METOPROLOL TARTRATE 5 MG/5ML IV SOLN
5.0000 mg | INTRAVENOUS | Status: DC | PRN
  Administered 2017-02-13: 5 mg via INTRAVENOUS
  Filled 2017-02-13: qty 5

## 2017-02-19 ENCOUNTER — Ambulatory Visit: Payer: Managed Care, Other (non HMO) | Admitting: Physician Assistant

## 2017-02-20 ENCOUNTER — Telehealth: Payer: Self-pay | Admitting: Physician Assistant

## 2017-02-20 NOTE — Telephone Encounter (Signed)
New message  Pt verbalized that she is returning call for RN  She said that RN told pt that she has fluid in her lungs   And that a medication can be called in and it has been over a week and she  Want RN to call back because she feels that she needs the medication   Called into [facility] on [address] in [place]

## 2017-02-20 NOTE — Telephone Encounter (Signed)
Returned pts call.  She was asking about the small pleural effusion found on her CT.  Pt states that she can now tell that it is there and she needed something done about it.  As pt was advised before, I advised pt to contact her pcp if she has worsening symptoms, dyspne/cp, in the interim, before her appt with Ronie Spies, PA-C on 02/27/17. Pt will contact pcp. Pt thanked me for the call back.

## 2017-02-27 ENCOUNTER — Encounter: Payer: Self-pay | Admitting: Physician Assistant

## 2017-02-27 ENCOUNTER — Ambulatory Visit (INDEPENDENT_AMBULATORY_CARE_PROVIDER_SITE_OTHER): Payer: Managed Care, Other (non HMO) | Admitting: Physician Assistant

## 2017-02-27 VITALS — BP 124/80 | HR 91 | Ht 63.0 in | Wt 97.8 lb

## 2017-02-27 DIAGNOSIS — G35 Multiple sclerosis: Secondary | ICD-10-CM | POA: Diagnosis not present

## 2017-02-27 DIAGNOSIS — I300 Acute nonspecific idiopathic pericarditis: Secondary | ICD-10-CM

## 2017-02-27 DIAGNOSIS — I483 Typical atrial flutter: Secondary | ICD-10-CM | POA: Diagnosis not present

## 2017-02-27 NOTE — Progress Notes (Signed)
Cardiology Office Note    Date:  02/27/2017   ID:  Sherri Ramirez, DOB 1955/09/21, MRN [id number]  PCP:  Andi Devon, MD  Cardiologist: Tobias Alexander, MD  Chief Complaint  Patient presents with  . Follow-up    History of Present Illness:  Sherri Ramirez is a 62 y.o. female with history of multiple sclerosis, seizures (last seizure in the 1990s), remote tobacco abuse, recently diagnosed atrial flutter, possible volume overload, pericarditis, hypokalemia, anemia.  She was admitted on 11/2016 with chest pain and atrial flutter treated with IV Cardizem and amiodarone and eventually converted to normal sinus rhythm.  She was also felt to have pericarditis and was placed on colchicine and ibuprofen for 5 days.  She was given IV Lasix for possible volume overload.  Small effusions on chest x-ray and possible pneumonia.  CHA2DS2-VASc equals 1 for female so she was started on aspirin.  Anticoagulation would be difficult given her anemia, interaction with phenytoin and fall risk.  CT 01/17/17 no evidence of PE small left pleural effusion and bibasilar atelectasis.  Coronary CT 02/13/17 calcium score of 0 no evidence of CAD, small left pleural effusion and bibasilar chronic scarring or subsegmental atelectasis in the lower lobes of the lungs bilaterally, aortic atherosclerosis.  Patient comes in today for follow-up.  She denies any further chest pain.  She is upset about having to pay for both CT scans.  Her breathing is fine.  She is trying to gain weight.     Past Medical History:  Diagnosis Date  . Anemia    a. drop from from 12->8.6 in 11/2016.  . Atrial flutter, paroxysmal ([facility])    a. dx 11/2016.  Marland Kitchen Former tobacco use   . Hypokalemia   . MS (multiple sclerosis) ([facility])   . Pericarditis    a. dx 11/2016.  . Seizure ([facility])   . Volume overload    a. in setting of atrial flutter 11/2016. Echo actually showed normal EF and normal diastolic function so was likely due to atrial flutter.      Past Surgical History:  Procedure Laterality Date  . ABDOMINAL HYSTERECTOMY    . HYSTEROTOMY      Current Medications: Current Meds  Medication Sig  . aspirin EC 81 MG EC tablet Take 1 tablet (81 mg total) by mouth daily.  . Interferon Beta-1a (REBIF REBIDOSE) 44 MCG/0.5ML SOAJ Inject 44 mcg into the skin 3 (three) times a week.  . phenytoin (DILANTIN) 100 MG ER capsule TAKE 3 CAPSULES EVERY EVENING     Allergies:   Carbamazepine and Glatiramer acetate   Social History   Socioeconomic History  . Marital status: Married    Spouse name: Sherri Ramirez  . Number of children: 0  . Years of education: None  . Highest education level: None  Social Needs  . Financial resource strain: None  . Food insecurity - worry: None  . Food insecurity - inability: None  . Transportation needs - medical: None  . Transportation needs - non-medical: None  Occupational History  . None  Tobacco Use  . Smoking status: Former Games developer  . Smokeless tobacco: Never Used  Substance and Sexual Activity  . Alcohol use: No  . Drug use: No  . Sexual activity: None  Other Topics Concern  . None  Social History Narrative   Patient is married and does not work.    Patient quit smoking in 2007.     Family History:  The patient's family history includes  Colon cancer in her mother; Heart attack in her father.   ROS:   Please see the history of present illness.    Review of Systems  Constitution: Positive for weight loss.  HENT: Negative.   Eyes: Negative.   Cardiovascular: Negative.   Respiratory: Negative.   Hematologic/Lymphatic: Negative.   Musculoskeletal: Negative.  Negative for joint pain.  Gastrointestinal: Negative.   Genitourinary: Negative.   Neurological: Negative.    All other systems reviewed and are negative.   PHYSICAL EXAM:   VS:  BP 124/80   Pulse 91   Ht 5\' 3"  (1.6 m)   Wt 97 lb 12.8 oz (44.4 kg)   SpO2 99%   BMI 17.32 kg/m   Physical Exam  GEN: Thin, in no acute  distress  Neck: no JVD, carotid bruits, or masses Cardiac:RRR; no murmurs, rubs, or gallops  Respiratory:  clear to auscultation bilaterally, normal work of breathing GI: soft, nontender, nondistended, + BS Ext: without cyanosis, clubbing, or edema, Good distal pulses bilaterally Neuro:  Alert and Oriented x 3 Psych: euthymic mood, full affect  Wt Readings from Last 3 Encounters:  02/27/17 97 lb 12.8 oz (44.4 kg)  01/16/17 94 lb 12.8 oz (43 kg)  12/26/16 97 lb 0 oz (44 kg)      Studies/Labs Reviewed:   EKG:  EKG is notordered today. Recent Labs: 12/25/2016: ALT 32; Magnesium 1.8; TSH 0.828 12/26/2016: B Natriuretic Peptide 183.8 01/16/2017: BUN 21; Creatinine, Ser 0.78; Hemoglobin 10.3; Platelets 245; Potassium 4.9; Sodium 141   Lipid Panel    Component Value Date/Time   CHOL 165 12/26/2016 0234   TRIG 67 12/26/2016 0234   HDL 43 12/26/2016 0234   CHOLHDL 3.8 12/26/2016 0234   VLDL 13 12/26/2016 0234   LDLCALC 109 (H) 12/26/2016 0234    Additional studies/ records that were reviewed today include:   IMPRESSION: No evidence of pulmonary embolus.   Small left pleural effusion.  Bibasilar atelectasis.     Electronically Signed   By: Charlett Nose M.D.   On: 01/17/2017 13:57   IMPRESSION: 1. Coronary calcium score of 0. This was 0 percentile for age and sex matched control.   2. Normal coronary origin with right dominance.   3. No evidence of CAD.     Electronically Signed   By: Tobias Alexander   On: 02/13/2017 19:09 FINDINGS: Aortic atherosclerosis. Small left pleural effusion lying dependently. Bibasilar areas of scarring and/or subsegmental atelectasis in the lower lobes of the lungs. Within the visualized portions of the thorax there are no suspicious appearing pulmonary nodules or masses, there is no acute consolidative airspace disease, no pneumothorax and no lymphadenopathy. Visualized portions of the upper abdomen are unremarkable. There are no  aggressive appearing lytic or blastic lesions noted in the visualized portions of the skeleton.   IMPRESSION: 1. Small left pleural effusion. 2. Bibasilar chronic scarring and/or subsegmental atelectasis in the lower lobes of lungs bilaterally. 3.  Aortic Atherosclerosis (ICD10-I70.0).   Electronically Signed: By: Trudie Reed M.D. On: 02/13/2017 16:40    ASSESSMENT:    1. Typical atrial flutter ([facility])   2. Acute idiopathic pericarditis   3. Multiple sclerosis ([facility])      PLAN:  In order of problems listed above:  Typical atrial flutter converted to normal sinus rhythm not on any rate lowering medications.  Acute idiopathic pericarditis resolved no longer taking colchicine.  CT negative for PE and calcium score is 0.  Patient's chest pain has resolved.  Follow-up with Dr. Delton See in 4 5 months  Multiple sclerosis followed by primary care and neurology    Medication Adjustments/Labs and Tests Ordered: Current medicines are reviewed at length with the patient today.  Concerns regarding medicines are outlined above.  Medication changes, Labs and Tests ordered today are listed in the Patient Instructions below. Patient Instructions  Medication Instructions:  Your physician recommends that you continue on your current medications as directed. Please refer to the Current Medication list given to you today.   Labwork: None ordered  Testing/Procedures: None ordered  Follow-Up: Your physician recommends that you schedule a follow-up appointment in: 5 MONTHS WITH DR. Delton See   Any Other Special Instructions Will Be Listed Below (If Applicable).     If you need a refill on your cardiac medications before your next appointment, please call your pharmacy.      Signed, Jacolyn Reedy, PA-C  02/27/2017 1:36 PM    [facility] [address] [place], [place], [place]  [address] Phone: [phone]; Fax: [phone]

## 2017-02-27 NOTE — Patient Instructions (Addendum)
Medication Instructions:  Your physician recommends that you continue on your current medications as directed. Please refer to the Current Medication list given to you today.   Labwork: None ordered  Testing/Procedures: None ordered  Follow-Up: Your physician recommends that you schedule a follow-up appointment in: 5 MONTHS WITH DR. Delton See   Any Other Special Instructions Will Be Listed Below (If Applicable).     If you need a refill on your cardiac medications before your next appointment, please call your pharmacy.

## 2017-04-25 ENCOUNTER — Ambulatory Visit: Payer: Managed Care, Other (non HMO) | Admitting: Physician Assistant

## 2017-05-16 ENCOUNTER — Telehealth: Payer: Self-pay | Admitting: Nurse Practitioner

## 2017-05-16 MED ORDER — PHENYTOIN SODIUM EXTENDED 100 MG PO CAPS: ORAL_CAPSULE | ORAL | 3 refills | Status: DC

## 2017-05-16 NOTE — Telephone Encounter (Signed)
Pt has called for her refill of phenytoin (DILANTIN) 100 MG ER capsule (pt states it should be for 300 mg)  [facility] - [place], [place] - [address] [phone] (Phone) [phone] (Fax)

## 2017-05-16 NOTE — Addendum Note (Signed)
Addended by: Guy Begin on: 05/16/2017 02:07 PM   Modules accepted: Orders

## 2017-06-05 ENCOUNTER — Telehealth: Payer: Self-pay | Admitting: Nurse Practitioner

## 2017-06-05 MED ORDER — INTERFERON BETA-1A 44 MCG/0.5ML ~~LOC~~ SOAJ: 44.0000 ug | SUBCUTANEOUS | 12 refills | Status: DC

## 2017-06-05 NOTE — Telephone Encounter (Signed)
Spoke with patient to advise her the Rx should be good until late Aug. She stated she just spoke with [facility] who told her she has no more refills. This RN advised will refill for her. She verbalized understanding, appreciation.

## 2017-06-05 NOTE — Telephone Encounter (Signed)
Patient requesting refill for  Interferon Beta-1a (REBIF REBIDOSE) 44 MCG/0.5ML SOAJ called to [facility].

## 2017-06-05 NOTE — Addendum Note (Signed)
Addended by: [place] Pink on: 06/05/2017 04:03 PM   Modules accepted: Orders

## 2017-06-07 NOTE — Telephone Encounter (Signed)
Received fax to complete for PA on Rebif. Form completed, on NP's desk for review, signature.

## 2017-06-08 NOTE — Telephone Encounter (Signed)
Late entry: completed, signed Rebif PA request form successfully faxed to [facility]. Per CMM, medication approved, Coverage end date for the drug:06/07/2018. This RN has not received fax confirmation of approval from [facility] as of this date.

## 2017-06-12 NOTE — Telephone Encounter (Signed)
Fax received form [facility] re: Rebif 44 mcg/  .5 ml pen injector approved effective 05/08/17 through 06/07/2018.

## 2017-07-03 ENCOUNTER — Encounter: Payer: Self-pay | Admitting: Physician Assistant

## 2017-07-03 ENCOUNTER — Encounter (INDEPENDENT_AMBULATORY_CARE_PROVIDER_SITE_OTHER): Payer: Self-pay

## 2017-07-03 ENCOUNTER — Ambulatory Visit (INDEPENDENT_AMBULATORY_CARE_PROVIDER_SITE_OTHER): Payer: Managed Care, Other (non HMO) | Admitting: Physician Assistant

## 2017-07-03 VITALS — BP 114/74 | HR 71 | Ht 63.0 in | Wt 101.1 lb

## 2017-07-03 DIAGNOSIS — R079 Chest pain, unspecified: Secondary | ICD-10-CM

## 2017-07-03 DIAGNOSIS — I300 Acute nonspecific idiopathic pericarditis: Secondary | ICD-10-CM | POA: Diagnosis not present

## 2017-07-03 DIAGNOSIS — G35 Multiple sclerosis: Secondary | ICD-10-CM | POA: Diagnosis not present

## 2017-07-03 DIAGNOSIS — I483 Typical atrial flutter: Secondary | ICD-10-CM

## 2017-07-03 MED ORDER — COLCHICINE 0.6 MG PO TABS: 0.6000 mg | ORAL_TABLET | Freq: Two times a day (BID) | ORAL | 3 refills | Status: DC

## 2017-07-03 NOTE — Progress Notes (Signed)
Cardiology Office Note    Date:  07/03/2017   ID:  Sherri Ramirez, DOB September 15, 1955, MRN [id number]  PCP:  Andi Devon, MD  Cardiologist: Tobias Alexander, MD  Chief Complaint  Patient presents with  . Follow-up    History of Present Illness:  Sherri Ramirez is a 62 y.o. female  with history of multiple sclerosis, seizures (last seizure in the 1990s), remote tobacco abuse, recently diagnosed atrial flutter, possible volume overload, pericarditis, hypokalemia, anemia.   She was admitted on 11/2016 with chest pain and atrial flutter treated with IV Cardizem and amiodarone and eventually converted to normal sinus rhythm.  She was also felt to have pericarditis and was placed on colchicine and ibuprofen for 5 days.  She was given IV Lasix for possible volume overload.  Small effusions on chest x-ray and possible pneumonia.  CHA2DS2-VASc equals 1 for female so she was started on aspirin.  Anticoagulation would be difficult given her anemia, interaction with phenytoin and fall risk.   CT 01/17/17 no evidence of PE small left pleural effusion and bibasilar atelectasis.  Coronary CT 02/13/17 calcium score of 0 no evidence of CAD, small left pleural effusion and bibasilar chronic scarring or subsegmental atelectasis in the lower lobes of the lungs bilaterally, aortic atherosclerosis.  I saw the patient 01/2017 at which time she was doing well.  She had no further chest pain and was not taking colchicine any longer.  Patient comes in today for f/u. When she lays down she has noticed some skipping of her heart. Also occurs when she yawns. Occasionally chest pain when she takes a deep breath.  Happens at least once a day.  Does not hurt when she lays down.  She does not recall her symptoms from the hospitalization.  All has been going on for a couple weeks.       Past Medical History:  Diagnosis Date  . Anemia    a. drop from from 12->8.6 in 11/2016.  . Atrial flutter, paroxysmal (HCC)    a. dx  11/2016.  Marland Kitchen Former tobacco use   . Hypokalemia   . MS (multiple sclerosis) (HCC)   . Pericarditis    a. dx 11/2016.  . Seizure (HCC)   . Volume overload    a. in setting of atrial flutter 11/2016. Echo actually showed normal EF and normal diastolic function so was likely due to atrial flutter.    Past Surgical History:  Procedure Laterality Date  . ABDOMINAL HYSTERECTOMY    . HYSTEROTOMY      Current Medications: Current Meds  Medication Sig  . aspirin EC 81 MG EC tablet Take 1 tablet (81 mg total) by mouth daily.  . Interferon Beta-1a (REBIF REBIDOSE) 44 MCG/0.5ML SOAJ Inject 44 mcg into the skin 3 (three) times a week.  . Magnesium-Zinc (MAGNESIUM-CHELATED ZINC) 133.33-5 MG TABS Take 1 capsule by mouth 2 (two) times daily.  . phenytoin (DILANTIN) 100 MG ER capsule TAKE 3 CAPSULES EVERY EVENING (Patient taking differently: Take 300 mg by mouth daily. TAKE 3 CAPSULES EVERY EVENING)     Allergies:   Carbamazepine and Glatiramer acetate   Social History   Socioeconomic History  . Marital status: Married    Spouse name: [name]  . Number of children: 0  . Years of education: Not on file  . Highest education level: Not on file  Occupational History  . Not on file  Social Needs  . Financial resource strain: Not on file  . Food  insecurity:    Worry: Not on file    Inability: Not on file  . Transportation needs:    Medical: Not on file    Non-medical: Not on file  Tobacco Use  . Smoking status: Former Games developer  . Smokeless tobacco: Never Used  Substance and Sexual Activity  . Alcohol use: No  . Drug use: No  . Sexual activity: Not on file  Lifestyle  . Physical activity:    Days per week: Not on file    Minutes per session: Not on file  . Stress: Not on file  Relationships  . Social connections:    Talks on phone: Not on file    Gets together: Not on file    Attends religious service: Not on file    Active member of club or organization: Not on file    Attends  meetings of clubs or organizations: Not on file    Relationship status: Not on file  Other Topics Concern  . Not on file  Social History Narrative   Patient is married and does not work.    Patient quit smoking in 2007.     Family History:  The patient's family history includes Colon cancer in her mother; Heart attack in her father.   ROS:   Please see the history of present illness.    Review of Systems  Constitution: Negative.  HENT: Negative.   Eyes: Negative.   Cardiovascular: Positive for chest pain and palpitations.  Respiratory: Negative.   Hematologic/Lymphatic: Negative.   Musculoskeletal: Negative.  Negative for joint pain.  Gastrointestinal: Negative.   Genitourinary: Negative.   Neurological: Negative.    All other systems reviewed and are negative.   PHYSICAL EXAM:   VS:  BP 114/74   Pulse 71   Ht 5\' 3"  (1.6 m)   Wt 101 lb 1.9 oz (45.9 kg)   SpO2 99%   BMI 17.91 kg/m    Physical Exam  GEN: Thin, in no acute distress  Neck: no JVD, carotid bruits, or masses Cardiac:RRR; no murmurs, rubs, or gallops  Respiratory:  clear to auscultation bilaterally, normal work of breathing GI: soft, nontender, nondistended, + BS Ext: without cyanosis, clubbing, or edema, Good distal pulses bilaterally Neuro:  Alert and Oriented x 3 Psych: euthymic mood, full affect  Wt Readings from Last 3 Encounters:  07/03/17 101 lb 1.9 oz (45.9 kg)  02/27/17 97 lb 12.8 oz (44.4 kg)  01/16/17 94 lb 12.8 oz (43 kg)      Studies/Labs Reviewed:   EKG:  EKG is ordered today.  The ekg ordered today demonstratesNormal sinus rhythm, normal EKG  Recent Labs: 12/25/2016: ALT 32; Magnesium 1.8; TSH 0.828 12/26/2016: B Natriuretic Peptide 183.8 01/16/2017: BUN 21; Creatinine, Ser 0.78; Hemoglobin 10.3; Platelets 245; Potassium 4.9; Sodium 141   Lipid Panel    Component Value Date/Time   CHOL 165 12/26/2016 0234   TRIG 67 12/26/2016 0234   HDL 43 12/26/2016 0234   CHOLHDL 3.8  12/26/2016 0234   VLDL 13 12/26/2016 0234   LDLCALC 109 (H) 12/26/2016 0234    Additional studies/ records that were reviewed today include:  IMPRESSION: No evidence of pulmonary embolus.   Small left pleural effusion.  Bibasilar atelectasis.     Electronically Signed   By: Charlett Nose M.D.   On: 01/17/2017 13:57   IMPRESSION: 1. Coronary calcium score of 0. This was 0 percentile for age and sex matched control.   2. Normal coronary origin with right  dominance.   3. No evidence of CAD.     Electronically Signed   By: Tobias Alexander   On: 02/13/2017 19:09 FINDINGS: Aortic atherosclerosis. Small left pleural effusion lying dependently. Bibasilar areas of scarring and/or subsegmental atelectasis in the lower lobes of the lungs. Within the visualized portions of the thorax there are no suspicious appearing pulmonary nodules or masses, there is no acute consolidative airspace disease, no pneumothorax and no lymphadenopathy. Visualized portions of the upper abdomen are unremarkable. There are no aggressive appearing lytic or blastic lesions noted in the visualized portions of the skeleton.   IMPRESSION: 1. Small left pleural effusion. 2. Bibasilar chronic scarring and/or subsegmental atelectasis in the lower lobes of lungs bilaterally. 3.  Aortic Atherosclerosis (ICD10-I70.0).   Electronically Signed: By: Trudie Reed M.D. On: 02/13/2017 16:40        ASSESSMENT:    1. Chest pain, unspecified type   2. Typical atrial flutter (HCC)   3. Idiopathic pericarditis, unspecified chronicity   4. Multiple sclerosis (HCC)      PLAN:  In order of problems listed above:  Chest pain when she takes deep breath.  EKG normal and not like when she had the pericarditis.No recent fever or viral infection  We will give her a trial of colchicine 0.6 mg twice daily for a week to see if this helps.  Order 2D echo but she can cancel if her symptoms resolve.  Follow-up with  Dr. Delton See.  Typical atrial flutter converted to normal sinus rhythm occurred in the setting of pericarditis on 11/2016-Now having some palpitations.  Will order an event monitor.  Check electrolytes.  Idiopathic pericarditis resolved.  CT was negative for PE and calcium score was 0-01/2017 Now with recurrent symptoms  Multiple sclerosis followed by PCP and neurology    Medication Adjustments/Labs and Tests Ordered: Current medicines are reviewed at length with the patient today.  Concerns regarding medicines are outlined above.  Medication changes, Labs and Tests ordered today are listed in the Patient Instructions below. Patient Instructions  Medication Instructions: Your physician has recommended you make the following change in your medication:  START: Colchicine 0.6 mg twice a day    Labwork: TODAY: BMET  Procedures/Testing: Your physician has recommended that you wear an event monitor. Event monitors are medical devices that record the heart's electrical activity. Doctors most often [place] these monitors to diagnose arrhythmias. Arrhythmias are problems with the speed or rhythm of the heartbeat. The monitor is a small, portable device. You can wear one while you do your normal daily activities. This is usually used to diagnose what is causing palpitations/syncope (passing out).    Follow-Up: Your physician recommends that you schedule a follow-up appointment with Dr. Delton See. First available if possible.     Any Additional Special Instructions Will Be Listed Below (If Applicable).   Cardiac Event Monitoring A cardiac event monitor is a small recording device that is used to detect abnormal heart rhythms (arrhythmias). The monitor is used to record your heart rhythm when you have symptoms, such as:  Fast heartbeats (palpitations), such as heart racing or fluttering.  Dizziness.  Fainting or light-headedness.  Unexplained weakness.  Some monitors are wired to electrodes  placed on your chest. Electrodes are flat, sticky disks that attach to your skin. Other monitors may be hand-held or worn on the wrist. The monitor can be worn for up to 30 days. If the monitor is attached to your chest, a technician will prepare your chest for  the electrode placement and show you how to work the monitor. Take time to practice using the monitor before you leave the office. Make sure you understand how to send the information from the monitor to your health care provider. In some cases, you may need to use a landline telephone instead of a cell phone. What are the risks? Generally, this device is safe to use, but it possible that the skin under the electrodes will become irritated. How to use your cardiac event monitor  Wear your monitor at all times, except when you are in water: ? Do not let the monitor get wet. ? Take the monitor off when you bathe. Do not swim or use a hot tub with it on.  Keep your skin clean. Do not put body lotion or moisturizer on your chest.  Change the electrodes as told by your health care provider or any time they stop sticking to your skin. You may need to use medical tape to keep them on.  Try to put the electrodes in slightly different places on your chest to help prevent skin irritation. They must remain in the area under your left breast and in the upper right section of your chest.  Make sure the monitor is safely clipped to your clothing or in a location close to your body that your health care provider recommends.  Press the button to record as soon as you feel heart-related symptoms, such as: ? Dizziness. ? Weakness. ? Light-headedness. ? Palpitations. ? Thumping or pounding in your chest. ? Shortness of breath. ? Unexplained weakness.  Keep a diary of your activities, such as walking, doing chores, and taking medicine. It is very important to note what you were doing when you pushed the button to record your symptoms. This will help  your health care provider determine what might be contributing to your symptoms.  Send the recorded information as recommended by your health care provider. It may take some time for your health care provider to process the results.  Change the batteries as told by your health care provider.  Keep electronic devices away from your monitor. This includes: ? Tablets. ? MP3 players. ? Cell phones.  While wearing your monitor you should avoid: ? Electric blankets. ? Firefighter. ? Electric toothbrushes. ? Microwave ovens. ? Magnets. ? Metal detectors. Get help right away if:  You have chest pain.  You have extreme difficulty breathing or shortness of breath.  You develop a very fast heartbeat that persists.  You develop dizziness that does not go away.  You faint or constantly feel like you are about to faint. Summary  A cardiac event monitor is a small recording device that is used to help detect abnormal heart rhythms (arrhythmias).  The monitor is used to record your heart rhythm when you have heart-related symptoms.  Make sure you understand how to send the information from the monitor to your health care provider.  It is important to press the button on the monitor when you have any heart-related symptoms.  Keep a diary of your activities, such as walking, doing chores, and taking medicine. It is very important to note what you were doing when you pushed the button to record your symptoms. This will help your health care provider learn what might be causing your symptoms. This information is not intended to replace advice given to you by your health care provider. Make sure you discuss any questions you have with your health care provider. Document  Released: 10/26/2007 Document Revised: 01/01/2016 Document Reviewed: 01/01/2016 Elsevier Interactive Patient Education  2017 [employer].    If you need a refill on your cardiac medications before your next appointment,  please call your pharmacy.      Signed, Jacolyn Reedy, PA-C  07/03/2017 2:25 PM    [facility] [address] [place], [place], [place]  [address] Phone: [phone]; Fax: [phone]

## 2017-07-03 NOTE — Patient Instructions (Addendum)
Medication Instructions: Your physician has recommended you make the following change in your medication:  START: Colchicine 0.6 mg twice a day    Labwork: TODAY: BMET  Procedures/Testing: Your physician has recommended that you wear an event monitor. Event monitors are medical devices that record the heart's electrical activity. Doctors most often [place] these monitors to diagnose arrhythmias. Arrhythmias are problems with the speed or rhythm of the heartbeat. The monitor is a small, portable device. You can wear one while you do your normal daily activities. This is usually used to diagnose what is causing palpitations/syncope (passing out).  Your physician has requested that you have an echocardiogram. Echocardiography is a painless test that uses sound waves to create images of your heart. It provides your doctor with information about the size and shape of your heart and how well your heart's chambers and valves are working. This procedure takes approximately one hour. There are no restrictions for this procedure.    Follow-Up: Your physician recommends that you schedule a follow-up appointment with Dr. Delton See. First available if possible.     Any Additional Special Instructions Will Be Listed Below (If Applicable).   Cardiac Event Monitoring A cardiac event monitor is a small recording device that is used to detect abnormal heart rhythms (arrhythmias). The monitor is used to record your heart rhythm when you have symptoms, such as:  Fast heartbeats (palpitations), such as heart racing or fluttering.  Dizziness.  Fainting or light-headedness.  Unexplained weakness.  Some monitors are wired to electrodes placed on your chest. Electrodes are flat, sticky disks that attach to your skin. Other monitors may be hand-held or worn on the wrist. The monitor can be worn for up to 30 days. If the monitor is attached to your chest, a technician will prepare your chest for the electrode  placement and show you how to work the monitor. Take time to practice using the monitor before you leave the office. Make sure you understand how to send the information from the monitor to your health care provider. In some cases, you may need to use a landline telephone instead of a cell phone. What are the risks? Generally, this device is safe to use, but it possible that the skin under the electrodes will become irritated. How to use your cardiac event monitor  Wear your monitor at all times, except when you are in water: ? Do not let the monitor get wet. ? Take the monitor off when you bathe. Do not swim or use a hot tub with it on.  Keep your skin clean. Do not put body lotion or moisturizer on your chest.  Change the electrodes as told by your health care provider or any time they stop sticking to your skin. You may need to use medical tape to keep them on.  Try to put the electrodes in slightly different places on your chest to help prevent skin irritation. They must remain in the area under your left breast and in the upper right section of your chest.  Make sure the monitor is safely clipped to your clothing or in a location close to your body that your health care provider recommends.  Press the button to record as soon as you feel heart-related symptoms, such as: ? Dizziness. ? Weakness. ? Light-headedness. ? Palpitations. ? Thumping or pounding in your chest. ? Shortness of breath. ? Unexplained weakness.  Keep a diary of your activities, such as walking, doing chores, and taking medicine. It is very  important to note what you were doing when you pushed the button to record your symptoms. This will help your health care provider determine what might be contributing to your symptoms.  Send the recorded information as recommended by your health care provider. It may take some time for your health care provider to process the results.  Change the batteries as told by your  health care provider.  Keep electronic devices away from your monitor. This includes: ? Tablets. ? MP3 players. ? Cell phones.  While wearing your monitor you should avoid: ? Electric blankets. ? Firefighter. ? Electric toothbrushes. ? Microwave ovens. ? Magnets. ? Metal detectors. Get help right away if:  You have chest pain.  You have extreme difficulty breathing or shortness of breath.  You develop a very fast heartbeat that persists.  You develop dizziness that does not go away.  You faint or constantly feel like you are about to faint. Summary  A cardiac event monitor is a small recording device that is used to help detect abnormal heart rhythms (arrhythmias).  The monitor is used to record your heart rhythm when you have heart-related symptoms.  Make sure you understand how to send the information from the monitor to your health care provider.  It is important to press the button on the monitor when you have any heart-related symptoms.  Keep a diary of your activities, such as walking, doing chores, and taking medicine. It is very important to note what you were doing when you pushed the button to record your symptoms. This will help your health care provider learn what might be causing your symptoms. This information is not intended to replace advice given to you by your health care provider. Make sure you discuss any questions you have with your health care provider. Document Released: 10/26/2007 Document Revised: 01/01/2016 Document Reviewed: 01/01/2016 Elsevier Interactive Patient Education  2017 [employer].    If you need a refill on your cardiac medications before your next appointment, please call your pharmacy.

## 2017-07-04 LAB — BASIC METABOLIC PANEL
BUN/Creatinine Ratio: 17 (ref 12–28)
BUN: 12 mg/dL (ref 8–27)
CO2: 25 mmol/L (ref 20–29)
Calcium: 9.6 mg/dL (ref 8.7–10.3)
Chloride: 104 mmol/L (ref 96–106)
Creatinine, Ser: 0.69 mg/dL (ref 0.57–1.00)
GFR calc Af Amer: 109 mL/min/{1.73_m2} (ref >59)
GFR calc non Af Amer: 95 mL/min/{1.73_m2} (ref >59)
Glucose: 88 mg/dL (ref 65–99)
Potassium: 4.3 mmol/L (ref 3.5–5.2)
Sodium: 142 mmol/L (ref 134–144)

## 2017-07-04 NOTE — Addendum Note (Signed)
Addended by: Vernard Gambles on: 07/04/2017 01:39 PM   Modules accepted: Orders

## 2017-07-11 ENCOUNTER — Other Ambulatory Visit: Payer: Self-pay | Admitting: Physician Assistant

## 2017-07-11 ENCOUNTER — Ambulatory Visit (INDEPENDENT_AMBULATORY_CARE_PROVIDER_SITE_OTHER): Payer: Managed Care, Other (non HMO)

## 2017-07-11 DIAGNOSIS — I300 Acute nonspecific idiopathic pericarditis: Secondary | ICD-10-CM

## 2017-07-11 DIAGNOSIS — I483 Typical atrial flutter: Secondary | ICD-10-CM

## 2017-07-11 DIAGNOSIS — R079 Chest pain, unspecified: Secondary | ICD-10-CM | POA: Diagnosis not present

## 2017-07-19 ENCOUNTER — Other Ambulatory Visit (HOSPITAL_COMMUNITY): Payer: Managed Care, Other (non HMO)

## 2017-07-25 ENCOUNTER — Telehealth: Payer: Self-pay | Admitting: *Deleted

## 2017-07-25 NOTE — Telephone Encounter (Signed)
Pt parking placard on [address] desk 

## 2017-07-26 NOTE — Telephone Encounter (Signed)
I have no problem with signing the handicap sticker application.  The clinical examination shows a fairly normal gait with exception of some mildly unsteady tandem gait, before I signed the form, make sure that Sherri Ramirez agrees that the patient is a candidate for the sticker, I am not familiar with this patient.

## 2017-07-26 NOTE — Telephone Encounter (Signed)
I spoke to pt yesterday and she relayed that she states that she is not able to walk long distances (as she tires easily).  She has MS, she walks with a limp, she does not use mobility device.  NP do not sign handicap placard.  To Dr. Anne Hahn.

## 2017-08-08 ENCOUNTER — Ambulatory Visit (HOSPITAL_COMMUNITY): Payer: Managed Care, Other (non HMO) | Attending: Physician Assistant

## 2017-08-08 ENCOUNTER — Other Ambulatory Visit: Payer: Self-pay

## 2017-08-08 ENCOUNTER — Encounter (INDEPENDENT_AMBULATORY_CARE_PROVIDER_SITE_OTHER): Payer: Self-pay

## 2017-08-08 DIAGNOSIS — Z87891 Personal history of nicotine dependence: Secondary | ICD-10-CM | POA: Insufficient documentation

## 2017-08-08 DIAGNOSIS — I081 Rheumatic disorders of both mitral and tricuspid valves: Secondary | ICD-10-CM | POA: Insufficient documentation

## 2017-08-08 DIAGNOSIS — I4892 Unspecified atrial flutter: Secondary | ICD-10-CM | POA: Insufficient documentation

## 2017-08-08 DIAGNOSIS — R079 Chest pain, unspecified: Secondary | ICD-10-CM | POA: Insufficient documentation

## 2017-08-08 DIAGNOSIS — I300 Acute nonspecific idiopathic pericarditis: Secondary | ICD-10-CM | POA: Insufficient documentation

## 2017-08-08 DIAGNOSIS — R0600 Dyspnea, unspecified: Secondary | ICD-10-CM | POA: Insufficient documentation

## 2017-08-27 ENCOUNTER — Other Ambulatory Visit: Payer: Self-pay | Admitting: Nurse Practitioner

## 2017-09-28 NOTE — Progress Notes (Signed)
[facility]  PATIENT: Sherri Ramirez DOB: [birth year]   REASON FOR VISIT: Follow-up for relapsing remitting multiple sclerosis and seizure disorder  HISTORY FROM: Patient    HISTORY OF PRESENT ILLNESS:Sherri Ramirez 62 year old female returns for yearly followup.  She has a history of multiple sclerosis and is currently on Rebif without side effects or site injection problems. Last MRI of the brain 2004. She refuses further MRI she says disease has been stable.  She has had a history of seizure disorder, well controlled on Dilantin last seizure 1999. Multiple sclerosis was diagnosed in March 2004. She was initiated on Copaxone and then had an extreme allergic reaction and was initiated on Betaseron in April of 2007. Insurance will no longer cover Betaseron and she was initiated on Rebif in February 2015.  Denies double vision, no falls, no sensory changes, no spasticity.Denies loss of bowel or bladder control, no speech difficulties. She has been on disability from her job since January 2009.  She had a hospital admission in November of last year for atrial flutter with rapid ventricular response.  She returns for reevaluation.    REVIEW OF SYSTEMS: Full 14 system review of systems performed and notable only for those listed, all others are neg:  Constitutional: neg  Cardiovascular: neg Ear/Nose/Throat: neg  Skin: neg Eyes: neg Respiratory: neg Gastroitestinal: neg  Hematology/Lymphatic: neg  Endocrine: neg Musculoskeletal:neg Allergy/Immunology: neg Neurological: neg Psychiatric: neg Sleep : neg   ALLERGIES: Allergies  Allergen Reactions  . Carbamazepine   . Glatiramer Acetate     REACTION: anaphylaxis    HOME MEDICATIONS: Outpatient Medications Prior to Visit  Medication Sig Dispense Refill  . aspirin EC 81 MG EC tablet Take 1 tablet (81 mg total) by mouth daily. 30 tablet 3  . Interferon Beta-1a (REBIF REBIDOSE) 44 MCG/0.5ML SOAJ Inject 44 mcg into the  skin 3 (three) times a week. 6 mL 12  . phenytoin (DILANTIN) 100 MG ER capsule TAKE 3 CAPSULES EVERY EVENING (Patient taking differently: Take 300 mg by mouth daily. TAKE 3 CAPSULES EVERY EVENING) 270 capsule 3  . colchicine 0.6 MG tablet Take 1 tablet (0.6 mg total) by mouth 2 (two) times daily. (Patient not taking: Reported on 10/02/2017) 180 tablet 3  . Magnesium-Zinc (MAGNESIUM-CHELATED ZINC) 133.33-5 MG TABS Take 1 capsule by mouth 2 (two) times daily.     No facility-administered medications prior to visit.     PAST MEDICAL HISTORY: Past Medical History:  Diagnosis Date  . Anemia    a. drop from from 12->8.6 in 11/2016.  . Atrial flutter, paroxysmal (HCC)    a. dx 11/2016.  Marland Kitchen Former tobacco use   . Hypokalemia   . MS (multiple sclerosis) (HCC)   . Pericarditis    a. dx 11/2016.  . Seizure (HCC)   . Volume overload    a. in setting of atrial flutter 11/2016. Echo actually showed normal EF and normal diastolic function so was likely due to atrial flutter.    PAST SURGICAL HISTORY: Past Surgical History:  Procedure Laterality Date  . ABDOMINAL HYSTERECTOMY    . HYSTEROTOMY      FAMILY HISTORY: Family History  Problem Relation Age of Onset  . Colon cancer Mother   . Heart attack Father     SOCIAL HISTORY: Social History   Socioeconomic History  . Marital status: Married    Spouse name: [name]  . Number of children: 0  . Years of education: Not on file  . Highest education  level: Not on file  Occupational History  . Not on file  Social Needs  . Financial resource strain: Not on file  . Food insecurity:    Worry: Not on file    Inability: Not on file  . Transportation needs:    Medical: Not on file    Non-medical: Not on file  Tobacco Use  . Smoking status: Former Games developer  . Smokeless tobacco: Never Used  Substance and Sexual Activity  . Alcohol use: No  . Drug use: No  . Sexual activity: Not on file  Lifestyle  . Physical activity:    Days per week:  Not on file    Minutes per session: Not on file  . Stress: Not on file  Relationships  . Social connections:    Talks on phone: Not on file    Gets together: Not on file    Attends religious service: Not on file    Active member of club or organization: Not on file    Attends meetings of clubs or organizations: Not on file    Relationship status: Not on file  . Intimate partner violence:    Fear of current or ex partner: Not on file    Emotionally abused: Not on file    Physically abused: Not on file    Forced sexual activity: Not on file  Other Topics Concern  . Not on file  Social History Narrative   Patient is married and does not work.    Patient quit smoking in 2007.     PHYSICAL EXAM  Vitals:   10/02/17 1405  BP: 119/70  Pulse: 76  SpO2: 98%  Weight: 101 lb 3.2 oz (45.9 kg)  Height: 5\' 3"  (1.6 m)   Body mass index is 17.93 kg/m. General: well developed, well nourished, seated, in no evident distress  Head: head normocephalic and atraumatic. Oropharynx benign  Neck: supple with no carotid bruits   Neurologic Exam  Mental Status: Awake and fully alert. Oriented to place and time. Follows all commands. Speech and language normal.  Cranial Nerves: Pupils equal, briskly reactive to light. Extraocular movements full without nystagmus. Visual fields full to confrontation. Hearing intact and symmetric to finger snap. Facial sensation intact. Face, tongue, palate move normally and symmetrically. Neck flexion and extension normal.  Motor: Normal bulk and tone. Normal strength in all tested extremity muscles .  Sensory.: intact to touch and pinprick and vibratory in the upper and lower extremities.  Coordination: Rapid alternating movements normal in all extremities. Finger-to-nose and heel-to-shin performed accurately bilaterally.  Gait and Station: Arises from chair without difficulty. Stance is normal. Gait demonstrates normal stride length and balance . Able to  heel, toe and mildly unsteady with tandem walk No assistive device  Reflexes: 1+ and symmetric. Toes downgoing.   DIAGNOSTIC DATA (LABS, IMAGING, TESTING) -  ASSESSMENT AND PLAN  62 y.o. year old female  has a past medical history of Anemia, Atrial flutter, paroxysmal (HCC), Former tobacco use, Hypokalemia, MS (multiple sclerosis) (HCC), Pericarditis, Seizure (HCC), and Volume overload. here to follow-up. She is currently on Rebif without exacerbation of symptoms. She is currently on Dilantin without seizure activity.Patient refuses another MRI of the brain. The patient is a current patient of Dr.Willis   who is out of the office today . This note is sent to the work in doctor.     Continue Dilantin at current dose 300mg  daily will refill Will check labs CBC, and Dilantin level reviewed recent BMP  Reviewed hospital records from recent admission Continue Rebif 3 times weekly for MS F/U yearly and prn Nilda Riggs, [facility] , [facility], APRN  [facility] [address] [place], [place] [address] [phone]

## 2017-10-02 ENCOUNTER — Encounter: Payer: Self-pay | Admitting: Nurse Practitioner

## 2017-10-02 ENCOUNTER — Ambulatory Visit (INDEPENDENT_AMBULATORY_CARE_PROVIDER_SITE_OTHER): Payer: Managed Care, Other (non HMO) | Admitting: Nurse Practitioner

## 2017-10-02 VITALS — BP 119/70 | HR 76 | Ht 63.0 in | Wt 101.2 lb

## 2017-10-02 DIAGNOSIS — G35 Multiple sclerosis: Secondary | ICD-10-CM | POA: Diagnosis not present

## 2017-10-02 DIAGNOSIS — G40209 Localization-related (focal) (partial) symptomatic epilepsy and epileptic syndromes with complex partial seizures, not intractable, without status epilepticus: Secondary | ICD-10-CM | POA: Diagnosis not present

## 2017-10-02 DIAGNOSIS — Z5181 Encounter for therapeutic drug level monitoring: Secondary | ICD-10-CM

## 2017-10-02 MED ORDER — PHENYTOIN SODIUM EXTENDED 100 MG PO CAPS: 300.0000 mg | ORAL_CAPSULE | Freq: Every day | ORAL | 3 refills | Status: DC

## 2017-10-02 MED ORDER — INTERFERON BETA-1A 44 MCG/0.5ML ~~LOC~~ SOAJ: 44.0000 ug | SUBCUTANEOUS | 12 refills | Status: DC

## 2017-10-02 NOTE — Patient Instructions (Signed)
Continue Dilantin at current dose 300mg  daily will refill Will check labs CBC, and Dilantin level reviewed recent BMP Continue Rebif 3 times weekly for MS F/U yearly and prn

## 2017-10-03 ENCOUNTER — Telehealth: Payer: Self-pay

## 2017-10-03 LAB — CBC WITH DIFFERENTIAL/PLATELET
Basophils Absolute: 0 10*3/uL (ref 0.0–0.2)
Basos: 0 %
EOS (ABSOLUTE): 0.1 10*3/uL (ref 0.0–0.4)
Eos: 1 %
Hematocrit: 38.1 % (ref 34.0–46.6)
Hemoglobin: 12 g/dL (ref 11.1–15.9)
Immature Grans (Abs): 0 10*3/uL (ref 0.0–0.1)
Immature Granulocytes: 0 %
Lymphocytes Absolute: 1.9 10*3/uL (ref 0.7–3.1)
Lymphs: 30 %
MCH: 25.2 pg — ABNORMAL LOW (ref 26.6–33.0)
MCHC: 31.5 g/dL (ref 31.5–35.7)
MCV: 80 fL (ref 79–97)
Monocytes Absolute: 0.5 10*3/uL (ref 0.1–0.9)
Monocytes: 8 %
Neutrophils Absolute: 3.8 10*3/uL (ref 1.4–7.0)
Neutrophils: 61 %
Platelets: 227 10*3/uL (ref 150–450)
RBC: 4.77 x10E6/uL (ref 3.77–5.28)
RDW: 14.1 % (ref 12.3–15.4)
WBC: 6.3 10*3/uL (ref 3.4–10.8)

## 2017-10-03 LAB — PHENYTOIN LEVEL, TOTAL: Phenytoin (Dilantin), Serum: 3.4 ug/mL — ABNORMAL LOW (ref 10.0–20.0)

## 2017-10-03 NOTE — Telephone Encounter (Signed)
-----   Message from Nilda Riggs, NP sent at 10/03/2017 11:17 AM EDT ----- Hbg much improved and normal.  Dilantin level remains subtherapeutic however patient has not had a seizure since 1999.  Continue 300mg  Dilantin every day please call the patient

## 2017-10-03 NOTE — Telephone Encounter (Signed)
I contacted the patient and left a vm(ok per DPR) advising of results. Requested a call back if the patient would like to discuss further.  MB RN.

## 2017-10-08 NOTE — Progress Notes (Signed)
I have reviewed and agreed above plan. 

## 2017-10-22 ENCOUNTER — Encounter: Payer: Self-pay | Admitting: Cardiology

## 2017-10-22 ENCOUNTER — Ambulatory Visit (INDEPENDENT_AMBULATORY_CARE_PROVIDER_SITE_OTHER): Payer: Managed Care, Other (non HMO) | Admitting: Cardiology

## 2017-10-22 VITALS — BP 112/61 | HR 71 | Ht 63.0 in | Wt 104.0 lb

## 2017-10-22 DIAGNOSIS — I4892 Unspecified atrial flutter: Secondary | ICD-10-CM

## 2017-10-22 DIAGNOSIS — I309 Acute pericarditis, unspecified: Secondary | ICD-10-CM

## 2017-10-22 NOTE — Progress Notes (Signed)
Cardiology Office Note    Date:  10/22/2017   ID:  [name], DOB October 25, 1955, MRN [id number]  PCP:  Andi Devon, MD  Cardiologist: Tobias Alexander, MD  No chief complaint on file.   History of Present Illness:  Sherri Ramirez is a 62 y.o. female  with history of multiple sclerosis, seizures (last seizure in the 1990s), remote tobacco abuse, recently diagnosed atrial flutter, possible volume overload, pericarditis, hypokalemia, anemia.   She was admitted on 11/2016 with chest pain and atrial flutter treated with IV Cardizem and amiodarone and eventually converted to normal sinus rhythm.  She was also felt to have pericarditis and was placed on colchicine and ibuprofen for 5 days.  She was given IV Lasix for possible volume overload.  Small effusions on chest x-ray and possible pneumonia.  CHA2DS2-VASc equals 1 for female so she was started on aspirin.  Anticoagulation would be difficult given her anemia, interaction with phenytoin and fall risk.   CT 01/17/17 no evidence of PE small left pleural effusion and bibasilar atelectasis.  Coronary CT 02/13/17 calcium score of 0 no evidence of CAD, small left pleural effusion and bibasilar chronic scarring or subsegmental atelectasis in the lower lobes of the lungs bilaterally, aortic atherosclerosis.  I saw the patient 01/2017 at which time she was doing well.  She had no further chest pain and was not taking colchicine any longer.  Patient comes in today for f/u. When she lays down she has noticed some skipping of her heart. Also occurs when she yawns. Occasionally chest pain when she takes a deep breath.  Happens at least once a day.  Does not hurt when she lays down.  She does not recall her symptoms from the hospitalization.  All has been going on for a couple weeks.   10/22/2017, the patient is coming after 3 months, she states that her chest pains have resolved, she discontinued colchicine.  She feels well, she continues to be very active  and denies any chest pain shortness of breath, no recent palpitation or dizziness, no lower extremity edema.  Past Medical History:  Diagnosis Date  . Anemia    a. drop from from 12->8.6 in 11/2016.  . Atrial flutter, paroxysmal (HCC)    a. dx 11/2016.  Marland Kitchen Former tobacco use   . Hypokalemia   . MS (multiple sclerosis) (HCC)   . Pericarditis    a. dx 11/2016.  . Seizure (HCC)   . Volume overload    a. in setting of atrial flutter 11/2016. Echo actually showed normal EF and normal diastolic function so was likely due to atrial flutter.    Past Surgical History:  Procedure Laterality Date  . ABDOMINAL HYSTERECTOMY    . HYSTEROTOMY      Current Medications: Current Meds  Medication Sig  . aspirin EC 81 MG EC tablet Take 1 tablet (81 mg total) by mouth daily.  . Interferon Beta-1a (REBIF REBIDOSE) 44 MCG/0.5ML SOAJ Inject 44 mcg into the skin 3 (three) times a week.  . phenytoin (DILANTIN) 100 MG ER capsule Take 3 capsules (300 mg total) by mouth daily. TAKE 3 CAPSULES EVERY EVENING    Allergies:   Carbamazepine and Glatiramer acetate   Social History   Socioeconomic History  . Marital status: Married    Spouse name: [name]  . Number of children: 0  . Years of education: Not on file  . Highest education level: Not on file  Occupational History  . Not on  file  Social Needs  . Financial resource strain: Not on file  . Food insecurity:    Worry: Not on file    Inability: Not on file  . Transportation needs:    Medical: Not on file    Non-medical: Not on file  Tobacco Use  . Smoking status: Former Games developer  . Smokeless tobacco: Never Used  Substance and Sexual Activity  . Alcohol use: No  . Drug use: No  . Sexual activity: Not on file  Lifestyle  . Physical activity:    Days per week: Not on file    Minutes per session: Not on file  . Stress: Not on file  Relationships  . Social connections:    Talks on phone: Not on file    Gets together: Not on file     Attends religious service: Not on file    Active member of club or organization: Not on file    Attends meetings of clubs or organizations: Not on file    Relationship status: Not on file  Other Topics Concern  . Not on file  Social History Narrative   Patient is married and does not work.    Patient quit smoking in 2007.     Family History:  The patient's family history includes Colon cancer in her mother; Heart attack in her father.   ROS:   Please see the history of present illness.    Review of Systems  Constitution: Negative.  HENT: Negative.   Eyes: Negative.   Cardiovascular: Positive for chest pain and palpitations.  Respiratory: Negative.   Hematologic/Lymphatic: Negative.   Musculoskeletal: Negative.  Negative for joint pain.  Gastrointestinal: Negative.   Genitourinary: Negative.   Neurological: Negative.    All other systems reviewed and are negative.  PHYSICAL EXAM:   VS:  BP 112/61   Pulse 71   Ht 5\' 3"  (1.6 m)   Wt 104 lb (47.2 kg)   SpO2 98%   BMI 18.42 kg/m   Physical Exam  GEN: Thin, in no acute distress  Neck: no JVD, carotid bruits, or masses Cardiac:RRR; no murmurs, rubs, or gallops  Respiratory:  clear to auscultation bilaterally, normal work of breathing GI: soft, nontender, nondistended, + BS Ext: without cyanosis, clubbing, or edema, Good distal pulses bilaterally Neuro:  Alert and Oriented x 3 Psych: euthymic mood, full affect  Wt Readings from Last 3 Encounters:  10/22/17 104 lb (47.2 kg)  10/02/17 101 lb 3.2 oz (45.9 kg)  07/03/17 101 lb 1.9 oz (45.9 kg)    Studies/Labs Reviewed:   EKG:  EKG is ordered today.  The ekg ordered today demonstratesNormal sinus rhythm, normal EKG  Recent Labs: 12/25/2016: ALT 32; Magnesium 1.8; TSH 0.828 12/26/2016: B Natriuretic Peptide 183.8 07/03/2017: BUN 12; Creatinine, Ser 0.69; Potassium 4.3; Sodium 142 10/02/2017: Hemoglobin 12.0; Platelets 227   Lipid Panel    Component Value Date/Time   CHOL  165 12/26/2016 0234   TRIG 67 12/26/2016 0234   HDL 43 12/26/2016 0234   CHOLHDL 3.8 12/26/2016 0234   VLDL 13 12/26/2016 0234   LDLCALC 109 (H) 12/26/2016 0234    Additional studies/ records that were reviewed today include:  IMPRESSION: No evidence of pulmonary embolus.   Small left pleural effusion.  Bibasilar atelectasis.     Electronically Signed   By: Charlett Nose M.D.   On: 01/17/2017 13:57   IMPRESSION: 1. Coronary calcium score of 0. This was 0 percentile for age and sex matched control.  2. Normal coronary origin with right dominance. 3. No evidence of CAD.    Electronically Signed   By: Tobias Alexander   On: 02/13/2017 19:09 FINDINGS: Aortic atherosclerosis. Small left pleural effusion lying dependently. Bibasilar areas of scarring and/or subsegmental atelectasis in the lower lobes of the lungs. Within the visualized portions of the thorax there are no suspicious appearing pulmonary nodules or masses, there is no acute consolidative airspace disease, no pneumothorax and no lymphadenopathy. Visualized portions of the upper abdomen are unremarkable. There are no aggressive appearing lytic or blastic lesions noted in the visualized portions of the skeleton.   IMPRESSION: 1. Small left pleural effusion. 2. Bibasilar chronic scarring and/or subsegmental atelectasis in the lower lobes of lungs bilaterally. 3.  Aortic Atherosclerosis (ICD10-I70.0).   Electronically Signed: By: Trudie Reed M.D. On: 02/13/2017 16:40    ASSESSMENT:    1. Acute pericarditis, unspecified type   2. Paroxysmal atrial flutter (HCC)    PLAN:  In order of problems listed above:  Acute pericarditis  -resolved, her echocardiogram shows normal LVEF no significant valvular abnormalities.    Typical atrial flutter converted to normal sinus rhythm occurred in the setting of pericarditis on 11/2016, now asymptomatic.  Multiple sclerosis followed by PCP and neurology  Medication  Adjustments/Labs and Tests Ordered: Current medicines are reviewed at length with the patient today.  Concerns regarding medicines are outlined above.  Medication changes, Labs and Tests ordered today are listed in the Patient Instructions below. Patient Instructions  Medication Instructions:   Your physician recommends that you continue on your current medications as directed. Please refer to the Current Medication list given to you today.     Follow-Up:  Your physician wants you to follow-up in: ONE  YEAR WITH DR Johnell Comings will receive a reminder letter in the mail two months in advance. If you don't receive a letter, please call our office to schedule the follow-up appointment.        If you need a refill on your cardiac medications before your next appointment, please call your pharmacy.      Signed, Tobias Alexander, MD  10/22/2017 2:24 PM    [facility] [address] [place], [place], [place]  [address] Phone: [phone]; Fax: [phone]

## 2017-10-22 NOTE — Patient Instructions (Signed)

## 2017-11-26 ENCOUNTER — Other Ambulatory Visit: Payer: Self-pay | Admitting: Internal Medicine

## 2017-11-26 DIAGNOSIS — Z1231 Encounter for screening mammogram for malignant neoplasm of breast: Secondary | ICD-10-CM

## 2018-01-03 ENCOUNTER — Ambulatory Visit
Admission: RE | Admit: 2018-01-03 | Discharge: 2018-01-03 | Disposition: A | Discharge status: Home / Self Care | Payer: Managed Care, Other (non HMO) | Source: Ambulatory Visit | Attending: Internal Medicine | Admitting: Internal Medicine

## 2018-01-03 DIAGNOSIS — Z1231 Encounter for screening mammogram for malignant neoplasm of breast: Secondary | ICD-10-CM

## 2018-03-19 ENCOUNTER — Encounter: Payer: Self-pay | Admitting: Neurology

## 2018-07-02 ENCOUNTER — Telehealth: Payer: Self-pay

## 2018-07-03 NOTE — Telephone Encounter (Signed)
PA for Rebif has been completed through cover my med. PA received instant approval.  (Key: [id number])  This request has been approved.  Please note any additional information provided by [employer] at the bottom of your screen.  CaseId:[id number];Status:Approved; Date:06/03/2018;Coverage End Date:07/03/2019;

## 2018-08-27 DIAGNOSIS — K5900 Constipation, unspecified: Secondary | ICD-10-CM | POA: Diagnosis not present

## 2018-08-27 DIAGNOSIS — K5730 Diverticulosis of large intestine without perforation or abscess without bleeding: Secondary | ICD-10-CM | POA: Diagnosis not present

## 2018-08-27 DIAGNOSIS — Z1211 Encounter for screening for malignant neoplasm of colon: Secondary | ICD-10-CM | POA: Diagnosis not present

## 2018-08-27 DIAGNOSIS — Z800 Family history of malignant neoplasm of digestive organs: Secondary | ICD-10-CM | POA: Diagnosis not present

## 2018-09-03 ENCOUNTER — Other Ambulatory Visit: Payer: Self-pay | Admitting: *Deleted

## 2018-09-03 MED ORDER — REBIF REBIDOSE 44 MCG/0.5ML ~~LOC~~ SOAJ: 44.0000 ug | SUBCUTANEOUS | 12 refills | Status: DC

## 2018-10-02 DIAGNOSIS — Z1211 Encounter for screening for malignant neoplasm of colon: Secondary | ICD-10-CM | POA: Diagnosis not present

## 2018-10-02 DIAGNOSIS — D120 Benign neoplasm of cecum: Secondary | ICD-10-CM | POA: Diagnosis not present

## 2018-10-02 DIAGNOSIS — K635 Polyp of colon: Secondary | ICD-10-CM | POA: Diagnosis not present

## 2018-10-02 DIAGNOSIS — Z800 Family history of malignant neoplasm of digestive organs: Secondary | ICD-10-CM | POA: Diagnosis not present

## 2018-10-02 DIAGNOSIS — K5730 Diverticulosis of large intestine without perforation or abscess without bleeding: Secondary | ICD-10-CM | POA: Diagnosis not present

## 2018-10-02 LAB — HM COLONOSCOPY

## 2018-10-08 ENCOUNTER — Ambulatory Visit: Payer: Managed Care, Other (non HMO) | Admitting: Neurology

## 2018-10-16 ENCOUNTER — Ambulatory Visit: Payer: Managed Care, Other (non HMO) | Admitting: Neurology

## 2018-10-16 ENCOUNTER — Telehealth: Payer: Self-pay | Admitting: Neurology

## 2018-10-16 ENCOUNTER — Encounter: Payer: Self-pay | Admitting: Neurology

## 2018-10-16 NOTE — Telephone Encounter (Signed)
This patient canceled the same day of a revisit appointment today. 

## 2018-10-21 ENCOUNTER — Telehealth: Payer: Self-pay

## 2018-10-21 NOTE — Telephone Encounter (Signed)
Pt called in regards to the no show letter she  received for her 10/16/2018 appt. Pt called and canceled same day due to not feeling well and throwing up. Pt states this letter offended her because she has been a pt in our office since 1992 and has never no showed an appointment. Pt states this letter made her feel like she did not matter to our practice and her not feeling well did not matter. I explained this is a necessary protocol and it was not meant to upset or come across as a personal letter.  Pt proceeded to say next time she will come in "sick or not" "throwing up or not" "fever or not" and advised we are taking necessary protocols for covid 19 still. I offered my apologies several times to the pt. Pt continued to say the letter was very impersonal and made her very upset.  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 I offered my apologies once again and advised we do understand she was sick and could not come in. I further advised I would send a note to our managers to make them aware of this compliant,

## 2018-10-22 NOTE — Telephone Encounter (Signed)
Noted  

## 2018-11-17 IMAGING — CR DG CHEST 2V
2 series · 2 of 2 positions shown · non-contrast
Comparison: None available currently.

CLINICAL DATA: Chest pain.

EXAM:
CHEST  2 VIEW

[w chest lat]
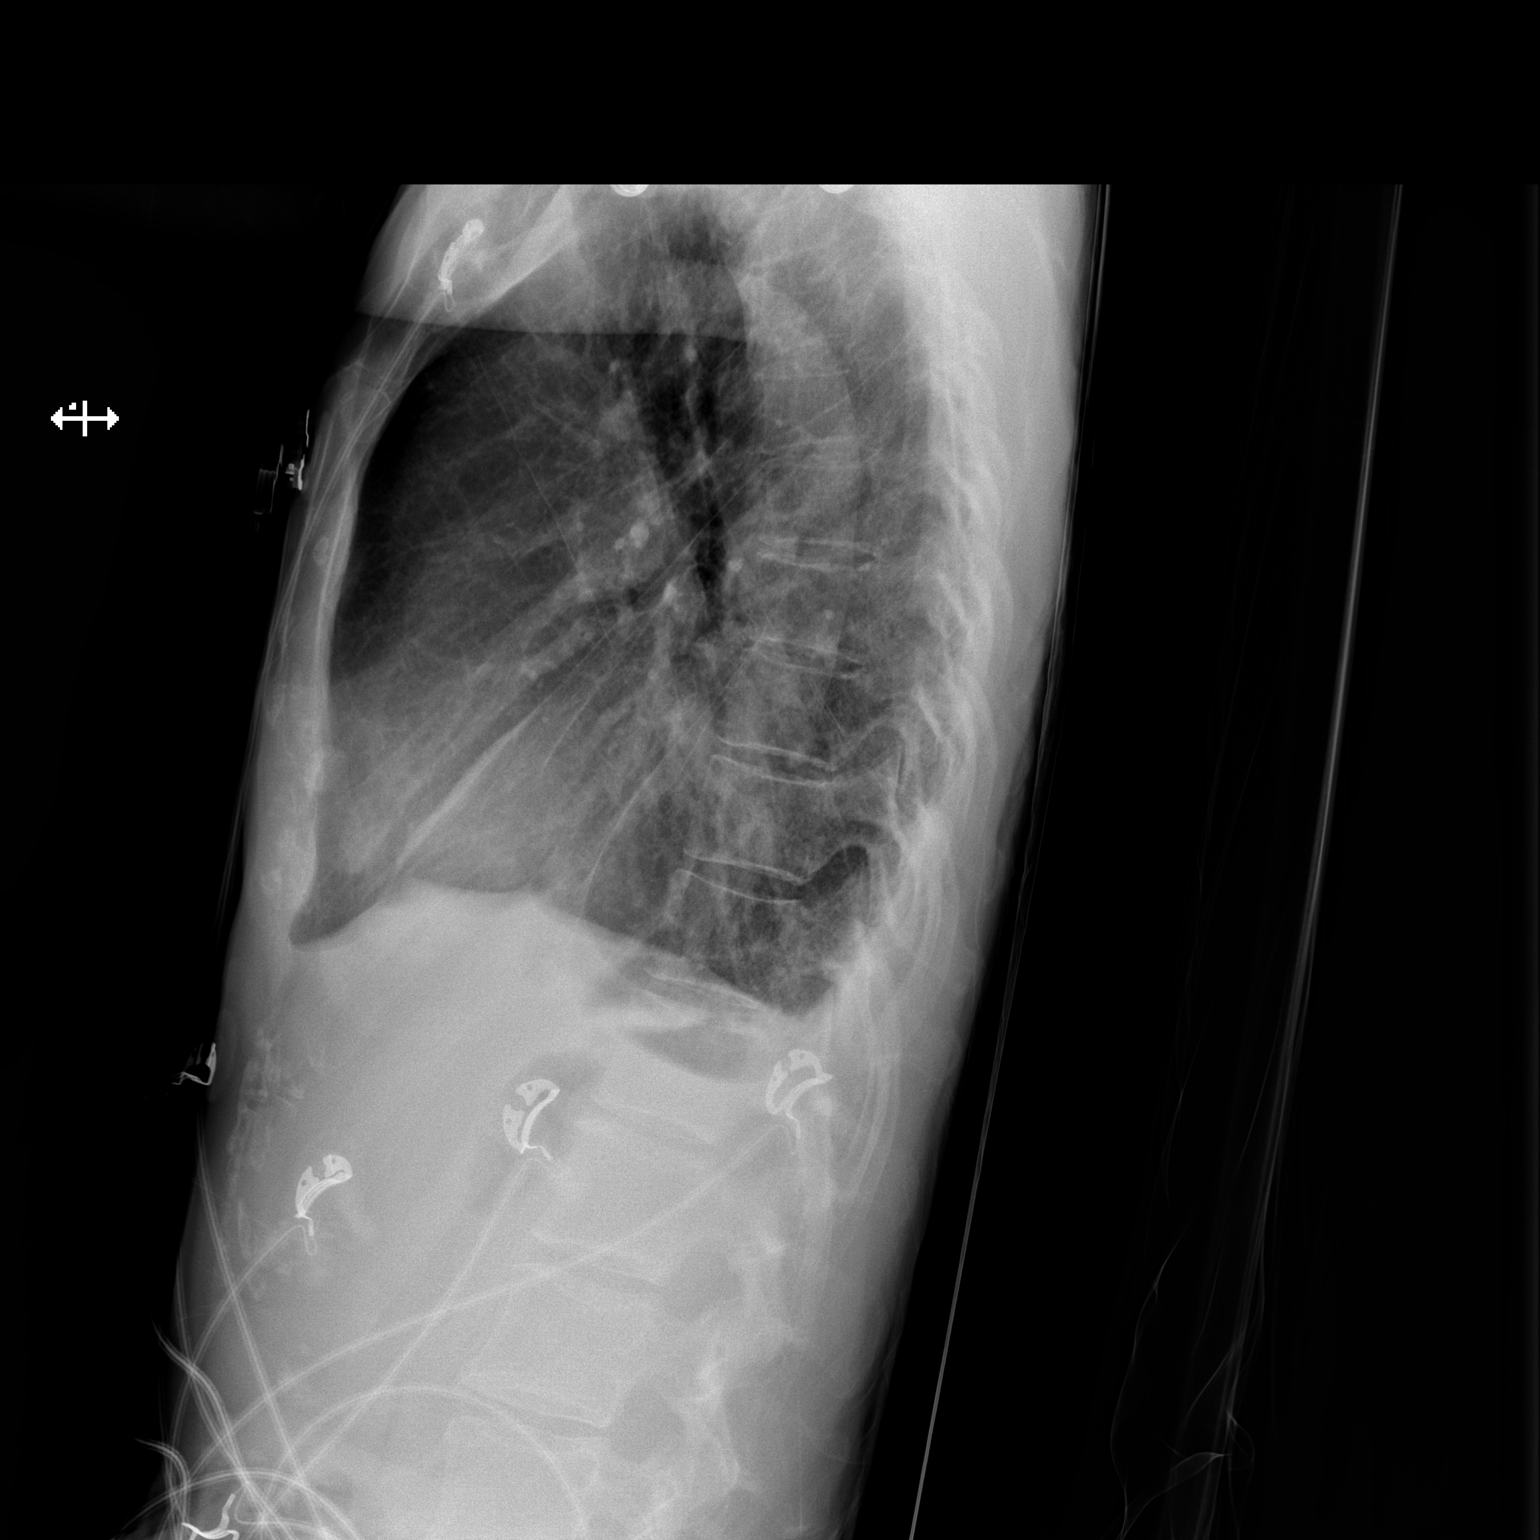

[x chest ap]
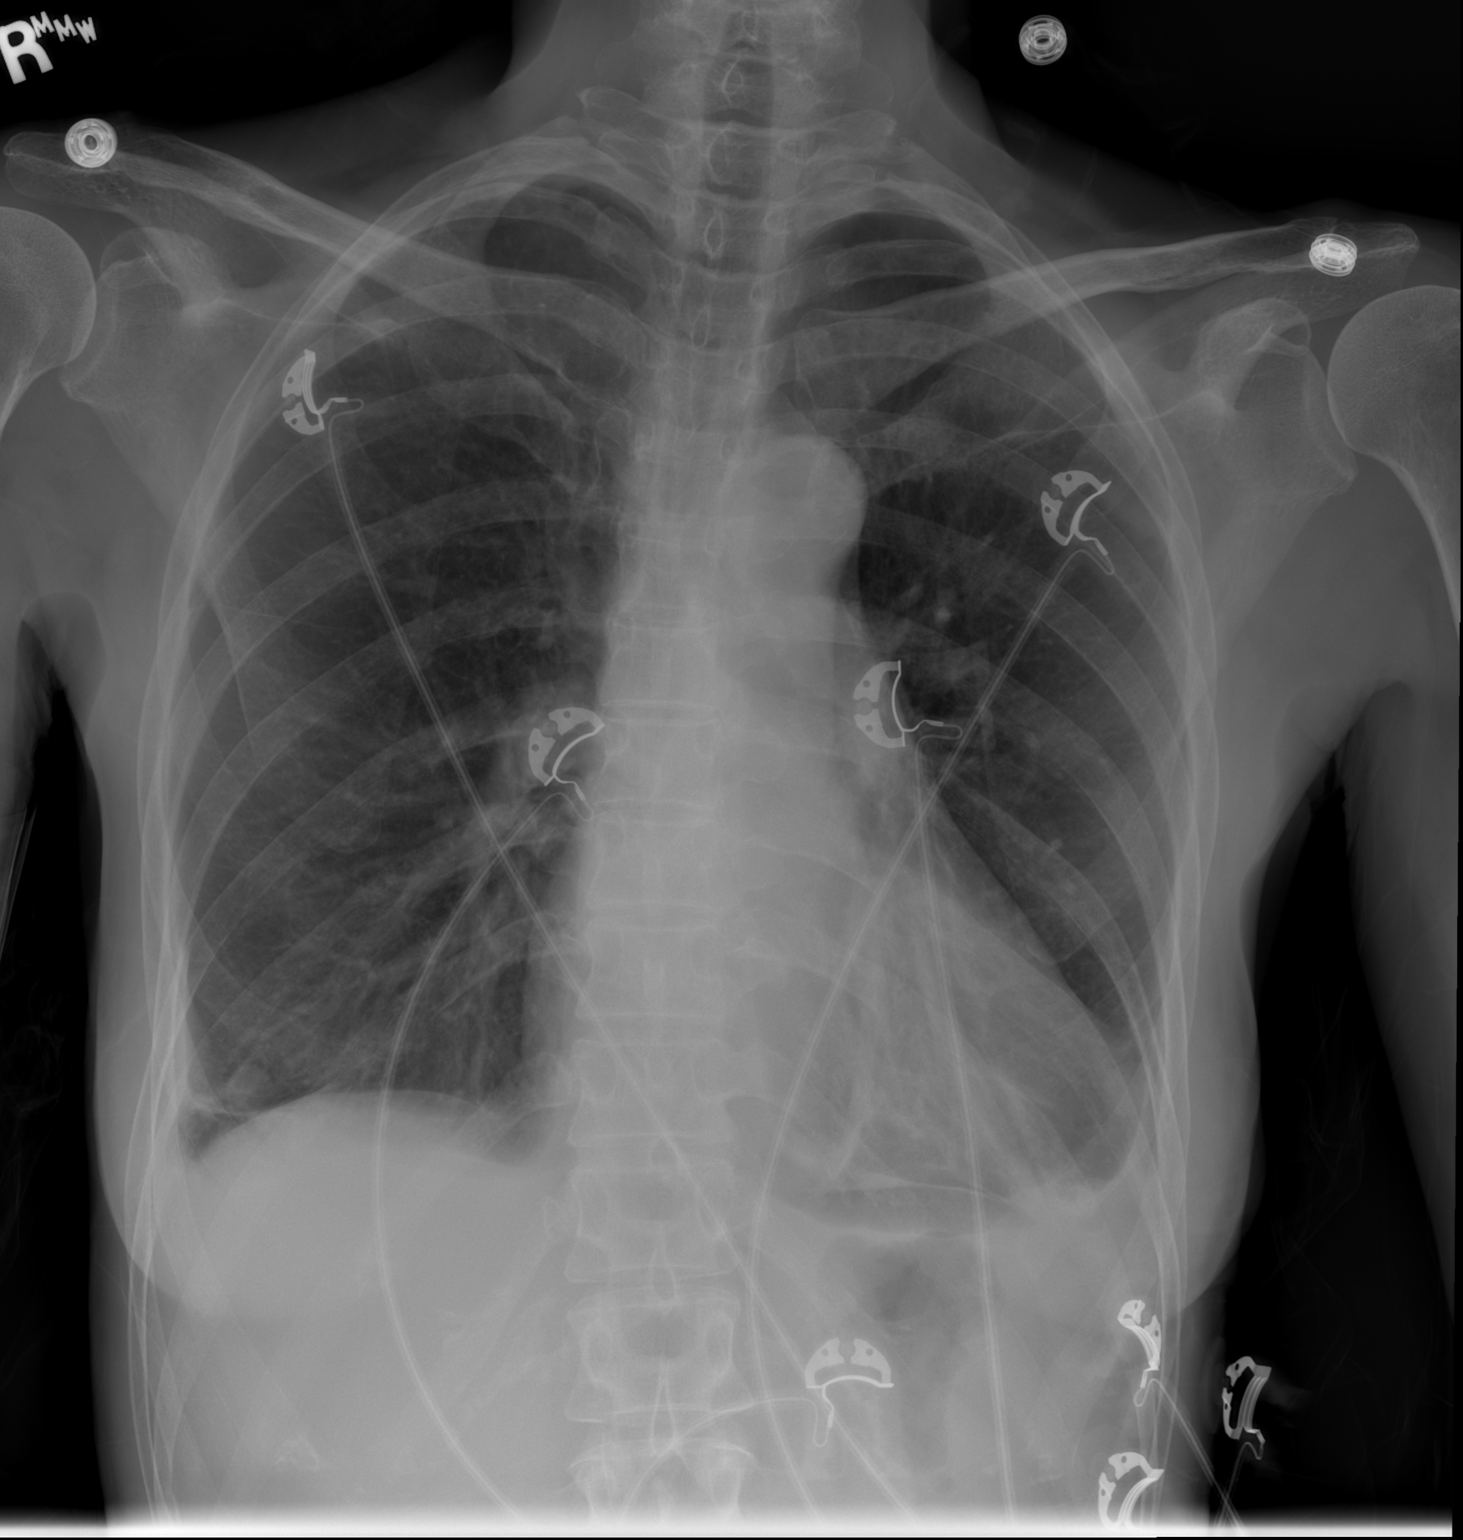

[2 of 2 positions shown; findings below may reference images not displayed]

FINDINGS: The heart size and mediastinal contours are within normal limits. No
pneumothorax is noted. Minimal right basilar subsegmental
atelectasis is noted with minimal right pleural effusion. Mild left
basilar subsegmental atelectasis is noted with mild left pleural
effusion. The visualized skeletal structures are unremarkable.
IMPRESSION: Minimal to mild bibasilar subsegmental atelectasis and pleural
effusions.

## 2018-11-19 NOTE — Progress Notes (Signed)
PATIENT: Sherri Ramirez DOB: 1955/04/01  REASON FOR VISIT: follow up HISTORY FROM: patient  HISTORY OF PRESENT ILLNESS: Today 11/20/18  Sherri Ramirez is a 63 year old female with history of multiple sclerosis.  She remains on Rebif without side effect.  Her last MRI of the brain was in 2004.  She has not wanted to pursue further MRI of the brain, as she feels her disease has been stable.  She has history of seizure disorder, has been well controlled on Dilantin since 1999.  Her multiple sclerosis was diagnosed in March 2004.  She has been on Rebif since 2015.  She denies any new numbness, weakness, changes to the bowels or bladder, or vision changes.  She has not had any falls.  She is currently working part-time at [employer].  She is able to perform all her own ADLs.  She does operate a motor vehicle without difficulty.  She denies any new medical problems.  She has routine follow-up with her primary doctor.  She was upset that she received a letter in the mail regarding her missed appointment when she was ill 10/16/2018.  She presents today for follow-up unaccompanied.  HISTORY HISTORY OF PRESENT ILLNESS:10/02/2017 CM: Sherri Ramirez 63 year old female returns for yearly followup.  She has a history of multiple sclerosis and is currently on Rebif without side effects or site injection problems. Last MRI of the brain 2004. She refuses further MRI she says disease has been stable.  She has had a history of seizure disorder, well controlled on Dilantin last seizure 1999. Multiple sclerosis was diagnosed in March 2004. She was initiated on Copaxone and then had an extreme allergic reaction and was initiated on Betaseron in April of 2007. Insurance will no longer cover Betaseron and she was initiated on Rebif in February 2015.  Denies double vision, no falls, no sensory changes, no spasticity.Denies loss of bowel or bladder control, no speech difficulties. She has been on disability from her job since January  2009.  She had a hospital admission in November of last year for atrial flutter with rapid ventricular response.  She returns for reevaluation.   REVIEW OF SYSTEMS: Out of a complete 14 system review of symptoms, the patient complains only of the following symptoms, and all other reviewed systems are negative.  N/A  ALLERGIES: Allergies  Allergen Reactions  . Carbamazepine   . Glatiramer Acetate     REACTION: anaphylaxis    HOME MEDICATIONS: Outpatient Medications Prior to Visit  Medication Sig Dispense Refill  . aspirin EC 81 MG EC tablet Take 1 tablet (81 mg total) by mouth daily. 30 tablet 3  . Interferon Beta-1a (REBIF REBIDOSE) 44 MCG/0.5ML SOAJ Inject 44 mcg into the skin 3 (three) times a week. 6 mL 12  . phenytoin (DILANTIN) 100 MG ER capsule Take 3 capsules (300 mg total) by mouth daily. TAKE 3 CAPSULES EVERY EVENING 270 capsule 3   No facility-administered medications prior to visit.     PAST MEDICAL HISTORY: Past Medical History:  Diagnosis Date  . Anemia    a. drop from from 12->8.6 in 11/2016.  . Atrial flutter, paroxysmal (HCC)    a. dx 11/2016.  Marland Kitchen Former tobacco use   . Hypokalemia   . MS (multiple sclerosis) (HCC)   . Pericarditis    a. dx 11/2016.  . Seizure (HCC)   . Volume overload    a. in setting of atrial flutter 11/2016. Echo actually showed normal EF and normal diastolic  function so was likely due to atrial flutter.    PAST SURGICAL HISTORY: Past Surgical History:  Procedure Laterality Date  . ABDOMINAL HYSTERECTOMY    . BREAST EXCISIONAL BIOPSY Left pt unsure   benign  . HYSTEROTOMY      FAMILY HISTORY: Family History  Problem Relation Age of Onset  . Colon cancer Mother   . Heart attack Father     SOCIAL HISTORY: Social History   Socioeconomic History  . Marital status: Married    Spouse name: [name]  . Number of children: 0  . Years of education: Not on file  . Highest education level: Not on file  Occupational History  .  Not on file  Social Needs  . Financial resource strain: Not on file  . Food insecurity    Worry: Not on file    Inability: Not on file  . Transportation needs    Medical: Not on file    Non-medical: Not on file  Tobacco Use  . Smoking status: Former Research scientist (life sciences)  . Smokeless tobacco: Never Used  Substance and Sexual Activity  . Alcohol use: No  . Drug use: No  . Sexual activity: Not on file  Lifestyle  . Physical activity    Days per week: Not on file    Minutes per session: Not on file  . Stress: Not on file  Relationships  . Social Herbalist on phone: Not on file    Gets together: Not on file    Attends religious service: Not on file    Active member of club or organization: Not on file    Attends meetings of clubs or organizations: Not on file    Relationship status: Not on file  . Intimate partner violence    Fear of current or ex partner: Not on file    Emotionally abused: Not on file    Physically abused: Not on file    Forced sexual activity: Not on file  Other Topics Concern  . Not on file  Social History Narrative   Patient is married and does not work.    Patient quit smoking in 2007.    PHYSICAL EXAM  Vitals:   11/20/18 1326  BP: 122/71  Pulse: 73  Temp: 97.9 F (36.6 C)  TempSrc: Oral  Weight: 104 lb 6.4 oz (47.4 kg)  Height: 5\' 3"  (1.6 m)   Body mass index is 18.49 kg/m.  Generalized: Well developed, in no acute distress   Neurological examination  Mentation: Alert oriented to time, place, history taking. Follows all commands speech and language fluent Cranial nerve II-XII: Pupils were equal round reactive to light. Extraocular movements were full, visual field were full on confrontational test. Facial sensation and strength were normal.. Head turning and shoulder shrug  were normal and symmetric. Motor: The motor testing reveals 5 over 5 strength of all 4 extremities. Good symmetric motor tone is noted throughout.  Sensory: Sensory  testing is intact to soft touch on all 4 extremities. No evidence of extinction is noted.  Coordination: Cerebellar testing reveals good finger-nose-finger and heel-to-shin bilaterally.  Gait and station: Gait is normal. Tandem gait is normal. Romberg is negative. No drift is seen.  Reflexes: Deep tendon reflexes are symmetric and normal bilaterally.   DIAGNOSTIC DATA (LABS, IMAGING, TESTING) - I reviewed patient records, labs, notes, testing and imaging myself where available.  Lab Results  Component Value Date   WBC 6.3 10/02/2017   HGB 12.0 10/02/2017  HCT 38.1 10/02/2017   MCV 80 10/02/2017   PLT 227 10/02/2017      Component Value Date/Time   NA 142 07/03/2017 1445   K 4.3 07/03/2017 1445   CL 104 07/03/2017 1445   CO2 25 07/03/2017 1445   GLUCOSE 88 07/03/2017 1445   GLUCOSE 93 12/29/2016 0423   BUN 12 07/03/2017 1445   CREATININE 0.69 07/03/2017 1445   CALCIUM 9.6 07/03/2017 1445   PROT 8.6 (H) 12/25/2016 1743   PROT 7.3 09/27/2016 1407   ALBUMIN 3.7 12/25/2016 1743   ALBUMIN 4.4 09/27/2016 1407   AST 45 (H) 12/25/2016 1743   ALT 32 12/25/2016 1743   ALKPHOS 95 12/25/2016 1743   BILITOT 0.9 12/25/2016 1743   BILITOT 0.2 09/27/2016 1407   GFRNONAA 95 07/03/2017 1445   GFRAA 109 07/03/2017 1445   Lab Results  Component Value Date   CHOL 165 12/26/2016   HDL 43 12/26/2016   LDLCALC 109 (H) 12/26/2016   TRIG 67 12/26/2016   CHOLHDL 3.8 12/26/2016   Lab Results  Component Value Date   HGBA1C 5.0 12/26/2016   Lab Results  Component Value Date   VITAMINB12 388 12/26/2016   Lab Results  Component Value Date   TSH 0.828 12/25/2016      ASSESSMENT AND PLAN 63 y.o. year old female  has a past medical history of Anemia, Atrial flutter, paroxysmal (HCC), Former tobacco use, Hypokalemia, MS (multiple sclerosis) (HCC), Pericarditis, Seizure (HCC), and Volume overload. here with:  1.  Multiple sclerosis 2.  History of seizures  She has continued to do quite  well since last seen.  She has not had any problems or exacerbations.  She will continue on Rebif.  She has not had recurrent seizure since 1999.  I will refill her Dilantin and Rebif.  I will check routine lab work, including a Dilantin level.  She has chosen to not pursue further MRI of the brain since 2004, as she feels her MS course has been quite stable.  She will follow-up in 1 year or sooner if needed.  I spent 15 minutes with the patient. 50% of this time was spent discussing her plan of care.  Margie Ege, AGNP-C, DNP 11/20/2018, 1:55 PM [facility] [address] [place], [place] [address] [phone]

## 2018-11-20 ENCOUNTER — Ambulatory Visit (INDEPENDENT_AMBULATORY_CARE_PROVIDER_SITE_OTHER): Payer: BC Managed Care – PPO | Admitting: Neurology

## 2018-11-20 ENCOUNTER — Encounter: Payer: Self-pay | Admitting: Neurology

## 2018-11-20 ENCOUNTER — Other Ambulatory Visit: Payer: Self-pay

## 2018-11-20 VITALS — BP 122/71 | HR 73 | Temp 97.9°F | Ht 63.0 in | Wt 104.4 lb

## 2018-11-20 DIAGNOSIS — G35 Multiple sclerosis: Secondary | ICD-10-CM

## 2018-11-20 DIAGNOSIS — G40209 Localization-related (focal) (partial) symptomatic epilepsy and epileptic syndromes with complex partial seizures, not intractable, without status epilepticus: Secondary | ICD-10-CM | POA: Diagnosis not present

## 2018-11-20 IMAGING — DX DG CHEST 2V
2 series · 2 of 2 positions shown · non-contrast
Comparison: Chest x-ray of 12/25/2016

CLINICAL DATA: Fever

EXAM:
CHEST  2 VIEW

[chest pa]
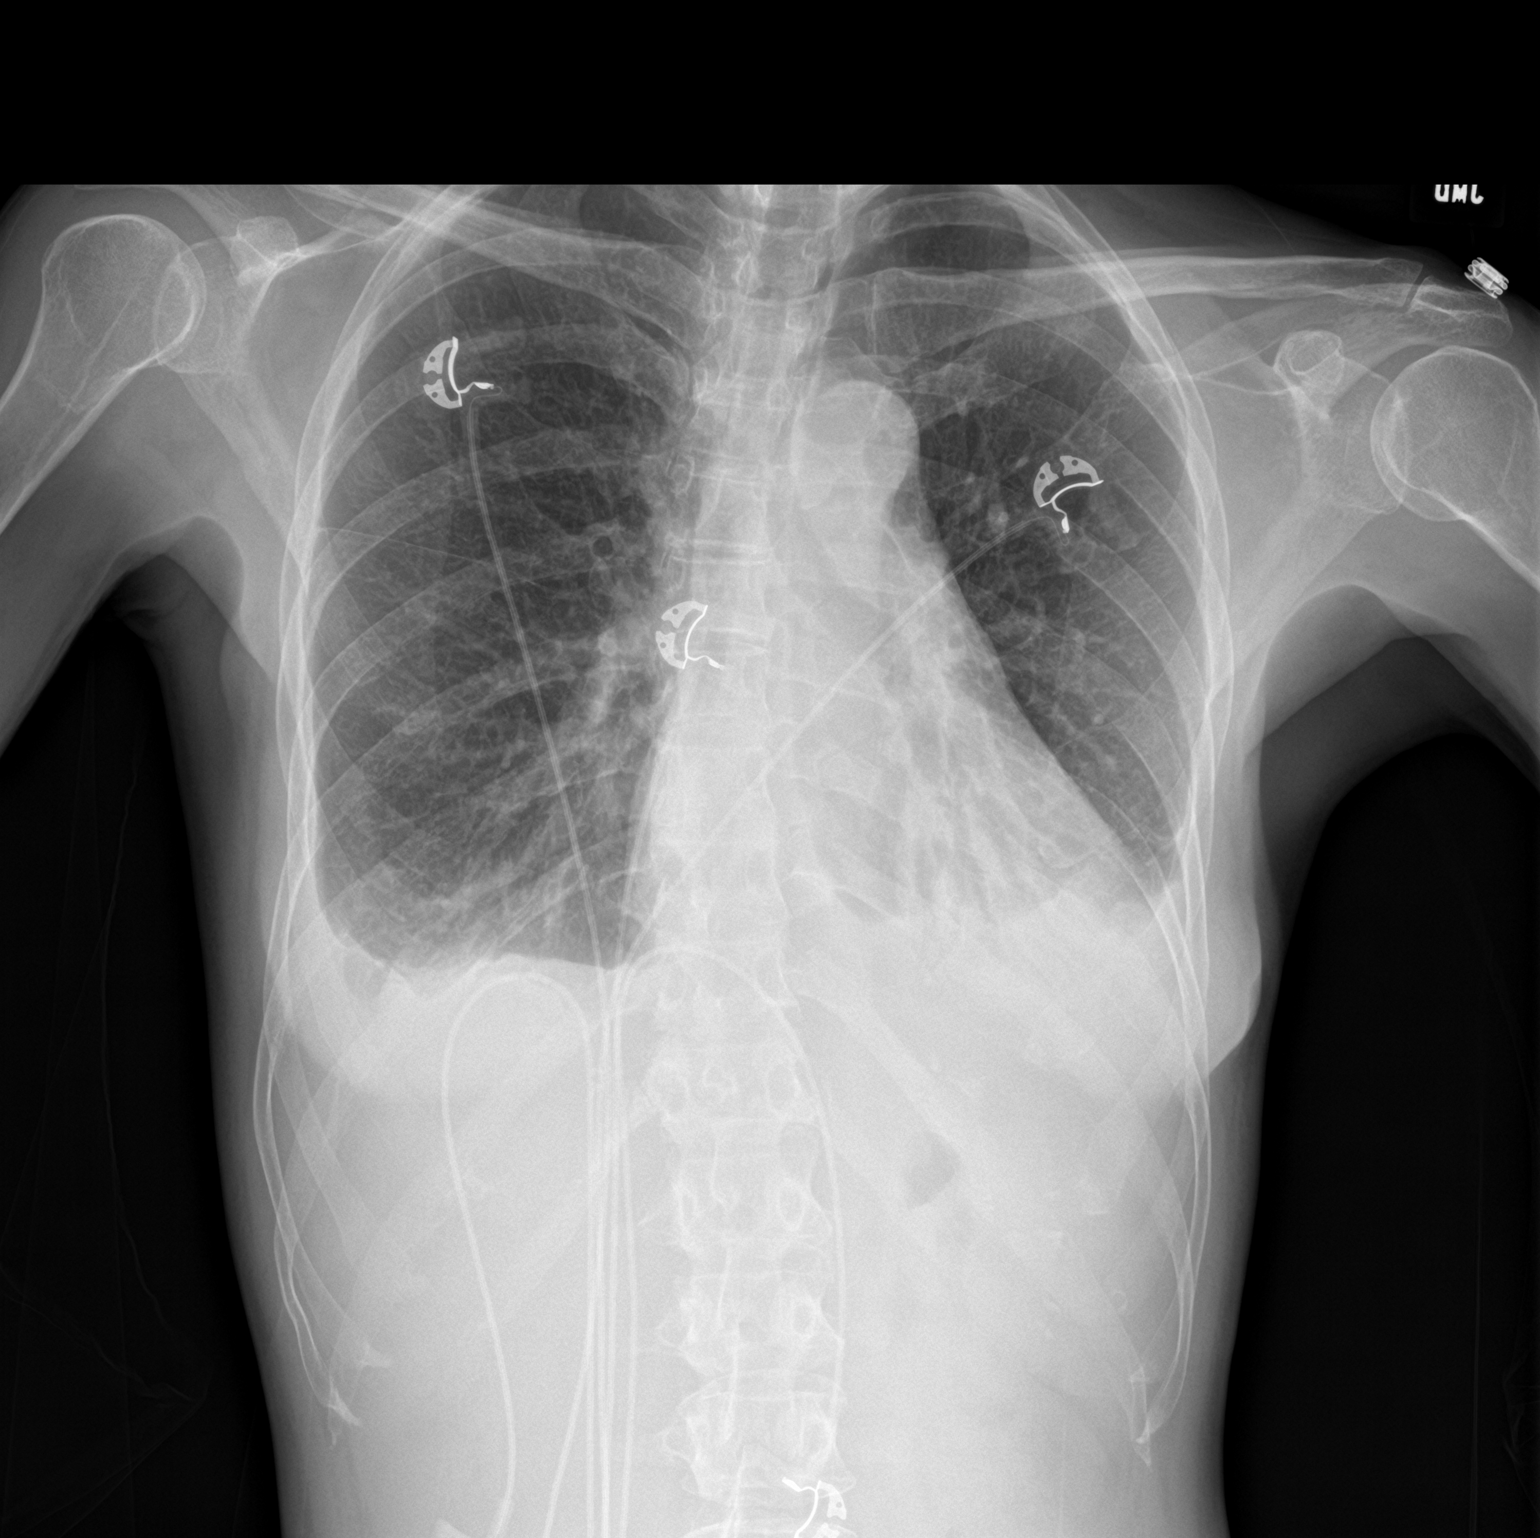

[chest lat]
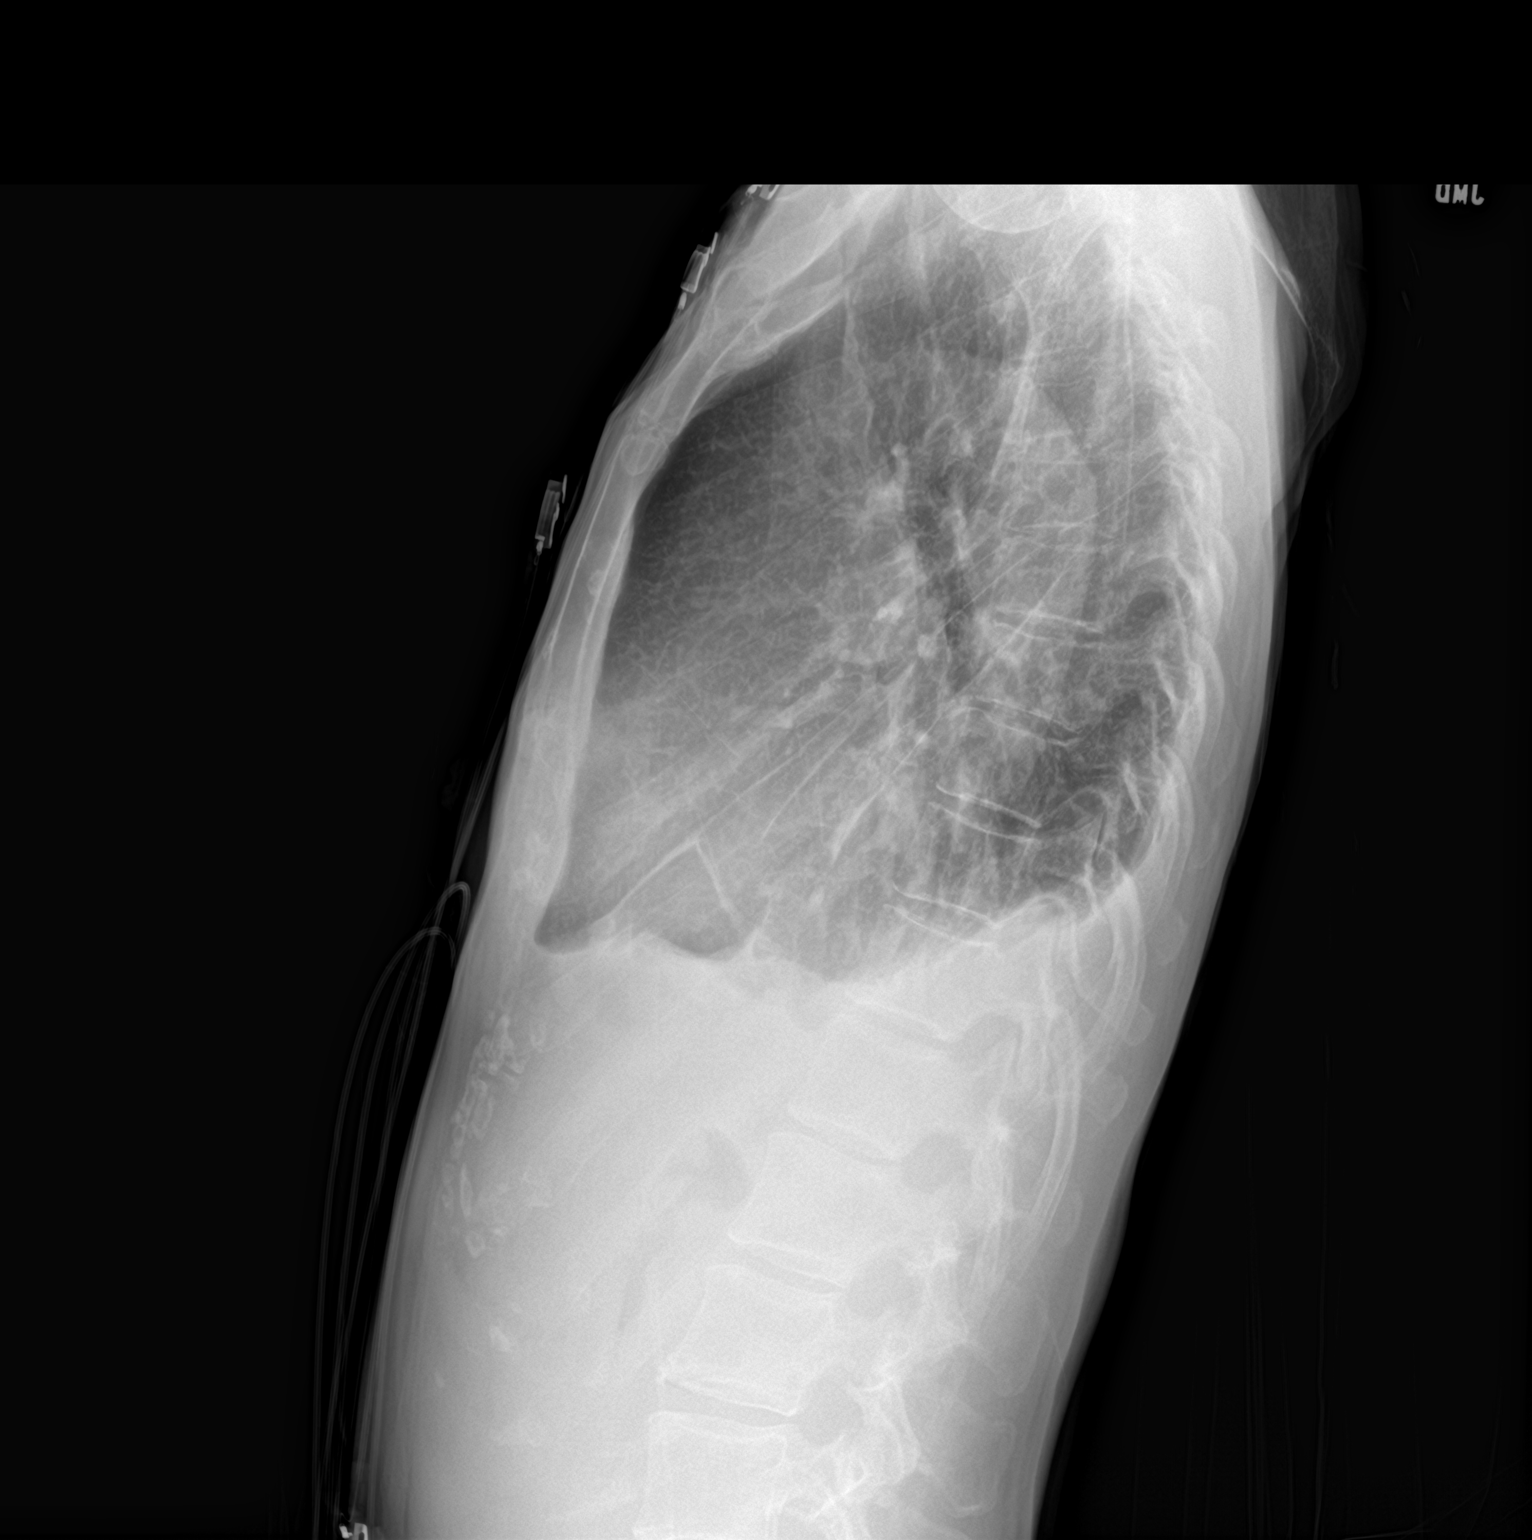

[2 of 2 positions shown; findings below may reference images not displayed]

FINDINGS: There are bilateral pleural effusions present which have increased
somewhat in size. There is also more opacity at the left lung base
suspicious for left lower lobe pneumonia. A mild degree of pulmonary
vascular congestion cannot be excluded. The heart is mildly
enlarged. No bony abnormality is seen.
IMPRESSION: 1. Increasing small bilateral pleural effusions with increasing
basilar atelectasis.
2. Increased opacity at the left lung base suspicious for pneumonia.
3. Cannot exclude mild superimposed pulmonary vascular congestion.

## 2018-11-20 MED ORDER — PHENYTOIN SODIUM EXTENDED 100 MG PO CAPS: 300.0000 mg | ORAL_CAPSULE | Freq: Every day | ORAL | 3 refills | Status: DC

## 2018-11-20 MED ORDER — REBIF REBIDOSE 44 MCG/0.5ML ~~LOC~~ SOAJ: 44.0000 ug | SUBCUTANEOUS | 12 refills | Status: DC

## 2018-11-20 NOTE — Patient Instructions (Signed)
1. Happy Birthday :) 2. No changes, I am glad you are doing well:) 3. I will check lab work  4. Return in 1 year

## 2018-11-20 NOTE — Progress Notes (Signed)
I have read the note, and I agree with the clinical assessment and plan.  Sherri Ramirez   

## 2018-11-21 ENCOUNTER — Telehealth: Payer: Self-pay | Admitting: *Deleted

## 2018-11-21 LAB — CBC WITH DIFFERENTIAL/PLATELET
Basophils Absolute: 0 10*3/uL (ref 0.0–0.2)
Basos: 0 %
EOS (ABSOLUTE): 0.1 10*3/uL (ref 0.0–0.4)
Eos: 1 %
Hematocrit: 36.4 % (ref 34.0–46.6)
Hemoglobin: 11.7 g/dL (ref 11.1–15.9)
Immature Grans (Abs): 0 10*3/uL (ref 0.0–0.1)
Immature Granulocytes: 0 %
Lymphocytes Absolute: 1.5 10*3/uL (ref 0.7–3.1)
Lymphs: 28 %
MCH: 25.7 pg — ABNORMAL LOW (ref 26.6–33.0)
MCHC: 32.1 g/dL (ref 31.5–35.7)
MCV: 80 fL (ref 79–97)
Monocytes Absolute: 0.4 10*3/uL (ref 0.1–0.9)
Monocytes: 8 %
Neutrophils Absolute: 3.4 10*3/uL (ref 1.4–7.0)
Neutrophils: 63 %
Platelets: 182 10*3/uL (ref 150–450)
RBC: 4.55 x10E6/uL (ref 3.77–5.28)
RDW: 14.3 % (ref 11.7–15.4)
WBC: 5.3 10*3/uL (ref 3.4–10.8)

## 2018-11-21 LAB — COMPREHENSIVE METABOLIC PANEL
ALT: 11 IU/L (ref 0–32)
AST: 17 IU/L (ref 0–40)
Albumin/Globulin Ratio: 1.4 (ref 1.2–2.2)
Albumin: 4.1 g/dL (ref 3.8–4.8)
Alkaline Phosphatase: 92 IU/L (ref 39–117)
BUN/Creatinine Ratio: 22 (ref 12–28)
BUN: 17 mg/dL (ref 8–27)
Bilirubin Total: <0.2 mg/dL (ref 0.0–1.2)
CO2: 26 mmol/L (ref 20–29)
Calcium: 9.3 mg/dL (ref 8.7–10.3)
Chloride: 103 mmol/L (ref 96–106)
Creatinine, Ser: 0.79 mg/dL (ref 0.57–1.00)
GFR calc Af Amer: 93 mL/min/{1.73_m2} (ref >59)
GFR calc non Af Amer: 80 mL/min/{1.73_m2} (ref >59)
Globulin, Total: 2.9 g/dL (ref 1.5–4.5)
Glucose: 87 mg/dL (ref 65–99)
Potassium: 4.4 mmol/L (ref 3.5–5.2)
Sodium: 139 mmol/L (ref 134–144)
Total Protein: 7 g/dL (ref 6.0–8.5)

## 2018-11-21 LAB — PHENYTOIN LEVEL, TOTAL: Phenytoin (Dilantin), Serum: 6.5 ug/mL — ABNORMAL LOW (ref 10.0–20.0)

## 2018-11-21 NOTE — Telephone Encounter (Signed)
Spoke with patient and informed her that her labs look stable. There's a low level of Dilantin, but she has not had seizures since 1999. There is no change in dosing. Patient verbalized understanding, appreciation.

## 2019-01-08 DIAGNOSIS — E785 Hyperlipidemia, unspecified: Secondary | ICD-10-CM | POA: Diagnosis not present

## 2019-01-08 DIAGNOSIS — Z20828 Contact with and (suspected) exposure to other viral communicable diseases: Secondary | ICD-10-CM | POA: Diagnosis not present

## 2019-01-08 DIAGNOSIS — E559 Vitamin D deficiency, unspecified: Secondary | ICD-10-CM | POA: Diagnosis not present

## 2019-01-08 DIAGNOSIS — Z01411 Encounter for gynecological examination (general) (routine) with abnormal findings: Secondary | ICD-10-CM | POA: Diagnosis not present

## 2019-02-23 ENCOUNTER — Ambulatory Visit: Payer: BC Managed Care – PPO | Attending: Internal Medicine

## 2019-02-23 DIAGNOSIS — Z23 Encounter for immunization: Secondary | ICD-10-CM | POA: Insufficient documentation

## 2019-02-24 NOTE — Progress Notes (Signed)
   Covid-19 Vaccination Clinic  Name:  SHENIA ALAN    MRN: [id number] DOB: Feb 23, 1955  02/23/2019  Ms. Tillis was observed post Covid-19 immunization for 15 minutes without incidence. She was provided with Vaccine Information Sheet and instruction to access the V-Safe system.   Ms. Rohrer was instructed to call 911 with any severe reactions post vaccine: Marland Kitchen Difficulty breathing  . Swelling of your face and throat  . A fast heartbeat  . A bad rash all over your body  . Dizziness and weakness    Immunizations Administered    Name Date Dose VIS Date Route   Moderna COVID-19 Vaccine 02/23/2019  2:58 PM 0.5 mL 12/31/2018 Intramuscular   Manufacturer: Gala Murdoch   Lot: [id number]   NDC: 14782-956-21      Documented on behalf of: P. Delford Field

## 2019-03-05 ENCOUNTER — Other Ambulatory Visit: Payer: Self-pay | Admitting: Internal Medicine

## 2019-03-05 DIAGNOSIS — Z1231 Encounter for screening mammogram for malignant neoplasm of breast: Secondary | ICD-10-CM

## 2019-03-23 ENCOUNTER — Ambulatory Visit: Payer: BC Managed Care – PPO | Attending: Internal Medicine

## 2019-03-23 DIAGNOSIS — Z23 Encounter for immunization: Secondary | ICD-10-CM | POA: Insufficient documentation

## 2019-03-23 NOTE — Progress Notes (Signed)
   Covid-19 Vaccination Clinic  Name:  Sherri Ramirez    MRN: [id number] DOB: 1955-10-23  03/23/2019  Sherri Ramirez was observed post Covid-19 immunization for 15 minutes without incidence. She was provided with Vaccine Information Sheet and instruction to access the V-Safe system.   Sherri Ramirez was instructed to call 911 with any severe reactions post vaccine: Marland Kitchen Difficulty breathing  . Swelling of your face and throat  . A fast heartbeat  . A bad rash all over your body  . Dizziness and weakness    Immunizations Administered    Name Date Dose VIS Date Route   Moderna COVID-19 Vaccine 03/23/2019  1:31 PM 0.5 mL 12/31/2018 Intramuscular   Manufacturer: Moderna   Lot: [id number]   NDC: 67209-470-96

## 2019-04-10 ENCOUNTER — Ambulatory Visit
Admission: RE | Admit: 2019-04-10 | Discharge: 2019-04-10 | Disposition: A | Discharge status: Home / Self Care | Payer: BC Managed Care – PPO | Source: Ambulatory Visit | Attending: Internal Medicine | Admitting: Internal Medicine

## 2019-04-10 ENCOUNTER — Other Ambulatory Visit: Payer: Self-pay

## 2019-04-10 DIAGNOSIS — Z1231 Encounter for screening mammogram for malignant neoplasm of breast: Secondary | ICD-10-CM

## 2019-04-21 NOTE — Progress Notes (Signed)
Cardiology Office Note    Date:  04/22/2019   ID:  Sherri Ramirez, Sherri Ramirez Mar 31, 1955, MRN [id number]  PCP:  Sherri Devon, MD  Cardiologist: Sherri Alexander, MD EPS: None  No chief complaint on file.   History of Present Illness:  Sherri Ramirez is a 64 y.o. female with history of MS, seizures, atrial flutter in the setting of pericarditis 2018, CHA2DS2-VASc equals 1 for female so treated with aspirin-anticoagulation would be difficult given her anemia, fall risk, and interaction with phenytoin.  Coronary CT 02/13/2017 calcium score 0 no evidence of CAD, small pericardial effusion  Patient last saw Dr. Delton Ramirez 09/2017 was doing well.  Patient comes in for f/u. Doing well. No chest pain, palpitations, dyspnea, edema. No regular exercise. Works part time at [employer].  Past Medical History:  Diagnosis Date   Anemia    a. drop from from 12->8.6 in 11/2016.   Atrial flutter, paroxysmal (HCC)    a. dx 11/2016.   Former tobacco use    Hypokalemia    MS (multiple sclerosis) (HCC)    Pericarditis    a. dx 11/2016.   Seizure (HCC)    Volume overload    a. in setting of atrial flutter 11/2016. Echo actually showed normal EF and normal diastolic function so was likely due to atrial flutter.    Past Surgical History:  Procedure Laterality Date   ABDOMINAL HYSTERECTOMY     BREAST EXCISIONAL BIOPSY Left pt unsure   benign   HYSTEROTOMY      Current Medications: Current Meds  Medication Sig   aspirin EC 81 MG EC tablet Take 1 tablet (81 mg total) by mouth daily.   Interferon Beta-1a (REBIF REBIDOSE) 44 MCG/0.5ML SOAJ Inject 44 mcg into the skin 3 (three) times a week.   phenytoin (DILANTIN) 100 MG ER capsule Take 300 mg by mouth daily.     Allergies:   Carbamazepine and Glatiramer acetate   Social History   Socioeconomic History   Marital status: Married    Spouse name: Sherri Ramirez   Number of children: 0   Years of education: Not on file   Highest  education level: Not on file  Occupational History   Not on file  Tobacco Use   Smoking status: Former Smoker   Smokeless tobacco: Never Used  Substance and Sexual Activity   Alcohol use: No   Drug use: No   Sexual activity: Not on file  Other Topics Concern   Not on file  Social History Narrative   Patient is married and does not work.    Patient quit smoking in 2007.   Social Determinants of Health   Financial Resource Strain:    Difficulty of Paying Living Expenses:   Food Insecurity:    Worried About Programme researcher, broadcasting/film/video in the Last Year:    Barista in the Last Year:   Transportation Needs:    Freight forwarder (Medical):    Lack of Transportation (Non-Medical):   Physical Activity:    Days of Exercise per Week:    Minutes of Exercise per Session:   Stress:    Feeling of Stress :   Social Connections:    Frequency of Communication with Friends and Family:    Frequency of Social Gatherings with Friends and Family:    Attends Religious Services:    Active Member of Clubs or Organizations:    Attends Banker Meetings:    Marital Status:  Family History:  The patient's family history includes Colon cancer in her mother; Heart attack in her father.   ROS:   Please Ramirez the history of present illness.    ROS All other systems reviewed and are negative.   PHYSICAL EXAM:   VS:  BP 138/88    Pulse (!) 104    Ht 5\' 3"  (1.6 m)    Wt 106 lb 3.2 oz (48.2 kg)    SpO2 99%    BMI 18.81 kg/m   Physical Exam  GEN: Thin, in no acute distress  Neck: no JVD, carotid bruits, or masses Cardiac:RRR; 2/6 systolic murmur Respiratory:  clear to auscultation bilaterally, normal work of breathing GI: soft, nontender, nondistended, + BS Ext: without cyanosis, clubbing, or edema, Good distal pulses bilaterally Neuro:  Alert and Oriented x 3 Psych: euthymic mood, full affect  Wt Readings from Last 3 Encounters:  04/22/19 106 lb 3.2 oz  (48.2 kg)  11/20/18 104 lb 6.4 oz (47.4 kg)  10/22/17 104 lb (47.2 kg)      Studies/Labs Reviewed:   EKG:  EKG is  ordered today.  The ekg ordered today demonstrates NSR with nonspecific ST T changes  Recent Labs: 11/20/2018: ALT 11; BUN 17; Creatinine, Ser 0.79; Hemoglobin 11.7; Platelets 182; Potassium 4.4; Sodium 139   Lipid Panel    Component Value Date/Time   CHOL 165 12/26/2016 0234   TRIG 67 12/26/2016 0234   HDL 43 12/26/2016 0234   CHOLHDL 3.8 12/26/2016 0234   VLDL 13 12/26/2016 0234   LDLCALC 109 (H) 12/26/2016 0234    Additional studies/ records that were reviewed today include:  IMPRESSION: 1. Coronary calcium score of 0. This was 0 percentile for age and sex matched control. 2. Normal coronary origin with right dominance. 3. No evidence of CAD.    Electronically Signed   By: Sherri Ramirez   On: 02/13/2017 19:09 FINDINGS: Aortic atherosclerosis. Small left pleural effusion lying dependently. Bibasilar areas of scarring and/or subsegmental atelectasis in the lower lobes of the lungs. Within the visualized portions of the thorax there are no suspicious appearing pulmonary nodules or masses, there is no acute consolidative airspace disease, no pneumothorax and no lymphadenopathy. Visualized portions of the upper abdomen are unremarkable. There are no aggressive appearing lytic or blastic lesions noted in the visualized portions of the skeleton.   IMPRESSION: 1. Small left pleural effusion. 2. Bibasilar chronic scarring and/or subsegmental atelectasis in the lower lobes of lungs bilaterally. 3.  Aortic Atherosclerosis (ICD10-I70.0).   Electronically Signed: By: Sherri Ramirez M.D. On: 02/13/2017 16:40     ASSESSMENT:    1. Paroxysmal atrial flutter ([place])   2. History of pericarditis   3. Multiple sclerosis ([place])      PLAN:  In order of problems listed above:  History of atrial flutter in the setting of acute pericarditis 11/2016  converted to normal sinus rhythm, CHA2DS2-VASc equals 1 on aspirin-no recurrence  History of pericarditis 2018, normal LV function  History of MS managed by neurology    Medication Adjustments/Labs and Tests Ordered: Current medicines are reviewed at length with the patient today.  Concerns regarding medicines are outlined above.  Medication changes, Labs and Tests ordered today are listed in the Patient Instructions below. Patient Instructions  Medication Instructions:  Your physician recommends that you continue on your current medications as directed. Please refer to the Current Medication list given to you today.  *If you need a refill on your cardiac medications before  your next appointment, please call your pharmacy*   Follow-Up: At [facility], you and your health needs are our priority.  As part of our continuing mission to provide you with exceptional heart care, we have created designated Provider Care Teams.  These Care Teams include your primary Cardiologist (physician) and Advanced Practice Providers (APPs -  Physician Assistants and Nurse Practitioners) who all work together to provide you with the care you need, when you need it.  We recommend signing up for the patient portal called "MyChart".  Sign up information is provided on this After Visit Summary.  MyChart is used to connect with patients for Virtual Visits (Telemedicine).  Patients are able to view lab/test results, encounter notes, upcoming appointments, etc.  Non-urgent messages can be sent to your provider as well.   To learn more about what you can do with MyChart, go to [email or web address].    Your next appointment:   1 year(s)  The format for your next appointment:   In Person  Provider:   Tobias Alexander, MD      Signed, Jacolyn Reedy, PA-C  04/22/2019 2:06 PM    [facility] [address] [place], [place], [place]  [address] Phone: [phone]; Fax: [phone]

## 2019-04-22 ENCOUNTER — Encounter: Payer: Self-pay | Admitting: Physician Assistant

## 2019-04-22 ENCOUNTER — Other Ambulatory Visit: Payer: Self-pay

## 2019-04-22 ENCOUNTER — Ambulatory Visit (INDEPENDENT_AMBULATORY_CARE_PROVIDER_SITE_OTHER): Payer: BC Managed Care – PPO | Admitting: Physician Assistant

## 2019-04-22 VITALS — BP 138/88 | HR 104 | Ht 63.0 in | Wt 106.2 lb

## 2019-04-22 DIAGNOSIS — I4892 Unspecified atrial flutter: Secondary | ICD-10-CM

## 2019-04-22 DIAGNOSIS — G35 Multiple sclerosis: Secondary | ICD-10-CM

## 2019-04-22 DIAGNOSIS — Z8679 Personal history of other diseases of the circulatory system: Secondary | ICD-10-CM | POA: Diagnosis not present

## 2019-04-22 NOTE — Patient Instructions (Signed)
Medication Instructions:   Your physician recommends that you continue on your current medications as directed. Please refer to the Current Medication list given to you today.  *If you need a refill on your cardiac medications before your next appointment, please call your pharmacy*   Follow-Up: At [facility], you and your health needs are our priority.  As part of our continuing mission to provide you with exceptional heart care, we have created designated Provider Care Teams.  These Care Teams include your primary Cardiologist (physician) and Advanced Practice Providers (APPs -  Physician Assistants and Nurse Practitioners) who all work together to provide you with the care you need, when you need it.  We recommend signing up for the patient portal called "MyChart".  Sign up information is provided on this After Visit Summary.  MyChart is used to connect with patients for Virtual Visits (Telemedicine).  Patients are able to view lab/test results, encounter notes, upcoming appointments, etc.  Non-urgent messages can be sent to your provider as well.   To learn more about what you can do with MyChart, go to [email or web address].    Your next appointment:   1 year(s)  The format for your next appointment:   In Person  Provider:   Katarina Nelson, MD     

## 2019-04-23 ENCOUNTER — Telehealth: Payer: Self-pay | Admitting: *Deleted

## 2019-04-23 NOTE — Telephone Encounter (Signed)
Pt called about her MS medication , needing to know name of what she was on previously,  REBIF, No generic.  She states recalls getting a letter about medication, but I have not documentation about it.  Approved last time in 07-02-18 the 07-03-19.  Pt has not been on therapy due to insurance change, none , is now signing up for [facility].  Will let us know.

## 2019-07-01 ENCOUNTER — Telehealth: Payer: Self-pay

## 2019-07-01 NOTE — Telephone Encounter (Signed)
[employer] form for Rebif(Rebidose) interferon beta !-A for 77mcg/0.5ml subcutaneous 3 times a week for 12 refills. Form signed by Maralyn Sago NP and fax to  [phone] twice and confirmed x2.

## 2019-07-01 NOTE — Telephone Encounter (Signed)
Rep with [facility] called to provide reference [id number] for rebif  States they faxed over information as well.  CB#[phone]

## 2019-07-01 NOTE — Telephone Encounter (Signed)
I called [employer] [facility] to get pts new insurance information.She has [employer] and her insurance is the following ID number [id number]  rx bind [id number] rx [id number]: Group [id number]. PA can be done on cover my meds.

## 2019-07-03 NOTE — Telephone Encounter (Signed)
PA has been sent via [facility]  Key is [id number] Status pending

## 2019-07-07 NOTE — Telephone Encounter (Signed)
Receive fax from [employer] that REbif was approve by [employer] from 07/03/2019 to 01/30/2020. Contact number is [phone] from 0800am to 1000pm [place] time.

## 2019-07-10 ENCOUNTER — Telehealth: Payer: Self-pay | Admitting: Neurology

## 2019-07-10 NOTE — Telephone Encounter (Signed)
Ryan from [name] called needing clarification on the Interferon Beta-1a (REBIF REBIDOSE) 44 MCG/0.5ML SOAJ prescription. He states a form was faxed as well. Please advise.

## 2019-07-14 NOTE — Telephone Encounter (Signed)
Sherri Ramirez @ [employer] has called stating pt informed a nurse on their end that she has been off the medication since Oct of 2020 and that she would like to go on the titration pack.  Alycia Rossetti can be reached at [phone] to discuss this further.

## 2019-07-14 NOTE — Telephone Encounter (Signed)
Received form, completed to SS/NP to sign.

## 2019-07-14 NOTE — Telephone Encounter (Signed)
I called [employer] and they are to fax me form that we need to fill out.  I also called and LMVM for pt to return call about her rebif medication.

## 2019-07-15 NOTE — Telephone Encounter (Signed)
I spoke to pt and she states that per pharmacy the delivery for her rebidose was going to be $2700.00. month and she cannot afford that so delivery is on hold.  She is to contact Financial assistance.  [phone] if the # I have for FA.  She has new insurance now for a month. [employer] MCR ID [id number] BIN [id number] PCN [id number] GRP [id number].  I faxed [employer] the prescription/enrollemnt form for the tritration and maintenance.  [phone], [phone] ofv. With fax confirmation received.

## 2019-07-15 NOTE — Telephone Encounter (Signed)
See 07-01-19 message.

## 2019-08-06 NOTE — Telephone Encounter (Signed)
I called and spoke to [employer].  They were able to get medication for pt 07-21-19 and then wernt over what she needed to do to look for funding.  She has not been back in touch as yet.  I will try to get in touch with pt.

## 2019-08-07 NOTE — Telephone Encounter (Signed)
I called spoke to pt.  She received rebidose for once month,  She is to reach out in 3 wks as she is on waiting list for PAN and other pt assistance (name?).  She would then be able to get 3 month supply.  She is to check daily with them about status, and she was told to let us know that she gave Dr. Anne Hahn email to them if needed.  I would relay this in my note.

## 2019-08-14 ENCOUNTER — Other Ambulatory Visit: Payer: Self-pay | Admitting: *Deleted

## 2019-08-14 MED ORDER — PHENYTOIN SODIUM EXTENDED 100 MG PO CAPS: 300.0000 mg | ORAL_CAPSULE | Freq: Every day | ORAL | 1 refills | Status: DC

## 2019-08-25 NOTE — Telephone Encounter (Signed)
I called pt to discuss. No answer, rang busy. Will try again later.

## 2019-08-26 NOTE — Telephone Encounter (Signed)
I received an email from Norina Buzzard [facility] acct manager about this pt and rebif.  She spoke to Investment banker, corporate for [employer].  There was a high copay and pt stated they would call back.  He has not heard back.  Asking if pt received free drug to is still planning to fill..  I called pt and she is still on waitlist for PAN, did receive 2nd month of rebif thru manufacturer.

## 2019-11-18 ENCOUNTER — Telehealth: Payer: Self-pay | Admitting: *Deleted

## 2019-11-18 NOTE — Telephone Encounter (Signed)
Received fax from [facility].  Pt has been referred to [facility] PAP. Pt is to complete and return the application they mailed to her to be considered for enrollment for the program.  The decision regarding eligibility will be mailed to our office as well.  [phone].

## 2019-11-25 ENCOUNTER — Ambulatory Visit: Payer: Self-pay | Admitting: Neurology

## 2020-01-07 ENCOUNTER — Telehealth: Payer: Self-pay

## 2020-01-07 NOTE — Telephone Encounter (Signed)
PA request for Rebif 34mcg/0.5mL submitted on CMM, Key: [id number] - PA Case ID: [id number]  Awaiting determination. [facility] is reviewing your PA request. Typically an electronic response will be received within 72 hours.

## 2020-01-07 NOTE — Telephone Encounter (Signed)
APPROVED  Request Reference Number: [id number]. REBIF REBIDO INJ 44/0.5 is approved through 01/29/2021. Your patient may now fill this prescription and it will be covered.

## 2020-02-12 ENCOUNTER — Other Ambulatory Visit: Payer: Self-pay | Admitting: Neurology

## 2020-03-18 ENCOUNTER — Other Ambulatory Visit: Payer: Self-pay

## 2020-03-18 ENCOUNTER — Encounter: Payer: Self-pay | Admitting: Neurology

## 2020-03-18 ENCOUNTER — Ambulatory Visit: Payer: Medicare Other | Admitting: Neurology

## 2020-03-18 VITALS — BP 125/72 | HR 81 | Ht 63.0 in | Wt 102.0 lb

## 2020-03-18 DIAGNOSIS — G35 Multiple sclerosis: Secondary | ICD-10-CM

## 2020-03-18 DIAGNOSIS — G40209 Localization-related (focal) (partial) symptomatic epilepsy and epileptic syndromes with complex partial seizures, not intractable, without status epilepticus: Secondary | ICD-10-CM | POA: Diagnosis not present

## 2020-03-18 MED ORDER — PHENYTOIN SODIUM EXTENDED 100 MG PO CAPS: 300.0000 mg | ORAL_CAPSULE | Freq: Every day | ORAL | 4 refills | Status: DC

## 2020-03-18 NOTE — Progress Notes (Signed)
PATIENT: Sherri Ramirez DOB: 1955-03-16  REASON FOR VISIT: follow up HISTORY FROM: patient  HISTORY OF PRESENT ILLNESS: Today 03/18/20 Sherri Ramirez is a 65 year old female with history of multiple sclerosis. Is doing well on Rebif.  Last MRI of the brain was in 2004, she has not wanted to pursue further MRIs.  Has history of seizure disorder, well-controlled on Dilantin since 1999.  MS diagnosed in March 2004, on Rebif since 2015. Feels MS remains overall stable. She has a chronic mild gait problem with the right leg. Occasionally, she may have a right leg muscle spasm. She has no complaints today. She works part-time as a Conservation officer, nature at [employer]. Here today for evaluation unaccompanied.  HISTORY 11/20/2018 SS:  Sherri Ramirez is a 65 year old female with history of multiple sclerosis.  She remains on Rebif without side effect.  Her last MRI of the brain was in 2004.  She has not wanted to pursue further MRI of the brain, as she feels her disease has been stable.  She has history of seizure disorder, has been well controlled on Dilantin since 1999.  Her multiple sclerosis was diagnosed in March 2004.  She has been on Rebif since 2015.  She denies any new numbness, weakness, changes to the bowels or bladder, or vision changes.  She has not had any falls.  She is currently working part-time at [employer].  She is able to perform all her own ADLs.  She does operate a motor vehicle without difficulty.  She denies any new medical problems.  She has routine follow-up with her primary doctor.  She was upset that she received a letter in the mail regarding her missed appointment when she was ill 10/16/2018.  She presents today for follow-up unaccompanied.  REVIEW OF SYSTEMS: Out of a complete 14 system review of symptoms, the patient complains only of the following symptoms, and all other reviewed systems are negative.  n/a  ALLERGIES: Allergies  Allergen Reactions  . Carbamazepine   . Glatiramer  Acetate     REACTION: anaphylaxis    HOME MEDICATIONS: Outpatient Medications Prior to Visit  Medication Sig Dispense Refill  . aspirin EC 81 MG EC tablet Take 1 tablet (81 mg total) by mouth daily. 30 tablet 3  . Interferon Beta-1a (REBIF REBIDOSE) 44 MCG/0.5ML SOAJ Inject 44 mcg into the skin 3 (three) times a week. 6 mL 12  . phenytoin (DILANTIN) 100 MG ER capsule TAKE 3 CAPSULES BY MOUTH  DAILY 270 capsule 0   No facility-administered medications prior to visit.    PAST MEDICAL HISTORY: Past Medical History:  Diagnosis Date  . Anemia    a. drop from from 12->8.6 in 11/2016.  . Atrial flutter, paroxysmal (HCC)    a. dx 11/2016.  Marland Kitchen Former tobacco use   . Hypokalemia   . MS (multiple sclerosis) (HCC)   . Pericarditis    a. dx 11/2016.  . Seizure (HCC)   . Volume overload    a. in setting of atrial flutter 11/2016. Echo actually showed normal EF and normal diastolic function so was likely due to atrial flutter.    PAST SURGICAL HISTORY: Past Surgical History:  Procedure Laterality Date  . ABDOMINAL HYSTERECTOMY    . BREAST EXCISIONAL BIOPSY Left pt unsure   benign  . HYSTEROTOMY      FAMILY HISTORY: Family History  Problem Relation Age of Onset  . Colon cancer Mother   . Heart attack Father  SOCIAL HISTORY: Social History   Socioeconomic History  . Marital status: Married    Spouse name: [name]  . Number of children: 0  . Years of education: Not on file  . Highest education level: Not on file  Occupational History  . Not on file  Tobacco Use  . Smoking status: Former Games developer  . Smokeless tobacco: Never Used  Vaping Use  . Vaping Use: Never used  Substance and Sexual Activity  . Alcohol use: No  . Drug use: No  . Sexual activity: Not on file  Other Topics Concern  . Not on file  Social History Narrative   Patient is married and does not work.    Patient quit smoking in 2007.   Social Determinants of Health   Financial Resource Strain: Not  on file  Food Insecurity: Not on file  Transportation Needs: Not on file  Physical Activity: Not on file  Stress: Not on file  Social Connections: Not on file  Intimate Partner Violence: Not on file   PHYSICAL EXAM  Vitals:   03/18/20 1306  BP: 125/72  Pulse: 81  Weight: 102 lb (46.3 kg)  Height: 5\' 3"  (1.6 m)   Body mass index is 18.07 kg/m.  Generalized: Well developed, in no acute distress   Neurological examination  Mentation: Alert oriented to time, place, history taking. Follows all commands speech and language fluent Cranial nerve II-XII: Pupils were equal round reactive to light. Extraocular movements were full, visual field were full on confrontational test. Facial sensation and strength were normal. Head turning and shoulder shrug  were normal and symmetric. Motor: Mild right 4/5 hip flexion, otherwise 5/5 all extremities Sensory: Sensory testing is intact to soft touch on all 4 extremities. No evidence of extinction is noted.  Coordination: Cerebellar testing reveals good finger-nose-finger and heel-to-shin bilaterally.  Gait and station: has mild limp on the right. Tandem gait was deferred. Romberg is negative. No drift is seen.  Reflexes: Deep tendon reflexes are symmetric and normal bilaterally.   DIAGNOSTIC DATA (LABS, IMAGING, TESTING) - I reviewed patient records, labs, notes, testing and imaging myself where available.  Lab Results  Component Value Date   WBC 5.3 11/20/2018   HGB 11.7 11/20/2018   HCT 36.4 11/20/2018   MCV 80 11/20/2018   PLT 182 11/20/2018      Component Value Date/Time   NA 139 11/20/2018 1418   K 4.4 11/20/2018 1418   CL 103 11/20/2018 1418   CO2 26 11/20/2018 1418   GLUCOSE 87 11/20/2018 1418   GLUCOSE 93 12/29/2016 0423   BUN 17 11/20/2018 1418   CREATININE 0.79 11/20/2018 1418   CALCIUM 9.3 11/20/2018 1418   PROT 7.0 11/20/2018 1418   ALBUMIN 4.1 11/20/2018 1418   AST 17 11/20/2018 1418   ALT 11 11/20/2018 1418    ALKPHOS 92 11/20/2018 1418   BILITOT <0.2 11/20/2018 1418   GFRNONAA 80 11/20/2018 1418   GFRAA 93 11/20/2018 1418   Lab Results  Component Value Date   CHOL 165 12/26/2016   HDL 43 12/26/2016   LDLCALC 109 (H) 12/26/2016   TRIG 67 12/26/2016   CHOLHDL 3.8 12/26/2016   Lab Results  Component Value Date   HGBA1C 5.0 12/26/2016   Lab Results  Component Value Date   VITAMINB12 388 12/26/2016   Lab Results  Component Value Date   TSH 0.828 12/25/2016   ASSESSMENT AND PLAN 65 y.o. year old female  has a past medical history of Anemia,  Atrial flutter, paroxysmal (HCC), Former tobacco use, Hypokalemia, MS (multiple sclerosis) (HCC), Pericarditis, Seizure (HCC), and Volume overload. here with:  1.  Multiple sclerosis 2.  History of seizures  -Remains overall stable in regards to MS and seizures -Continue Rebif for MS -Continue Dilantin for seizure prevention -Check routine labs, Dilantin level -She has decided not to pursue routine MRIs -Follow-up in 1 year or sooner if needed  I spent 20 minutes of face-to-face and non-face-to-face time with patient.  This included previsit chart review, lab review, study review, order entry, electronic health record documentation, patient education.  Margie Ege, AGNP-C, DNP 03/18/2020, 1:25 PM [facility] [address], [address] [place], [place] [address] [phone]

## 2020-03-18 NOTE — Patient Instructions (Signed)
Continue current medications Call for any problems  See you back in 1 year  Check labs today

## 2020-03-18 NOTE — Progress Notes (Signed)
I have read the note, and I agree with the clinical assessment and plan.  Oddis Westling K Tecla Mailloux   

## 2020-03-19 LAB — CBC WITH DIFFERENTIAL/PLATELET
Basophils Absolute: 0 10*3/uL (ref 0.0–0.2)
Basos: 1 %
EOS (ABSOLUTE): 0 10*3/uL (ref 0.0–0.4)
Eos: 1 %
Hematocrit: 37 % (ref 34.0–46.6)
Hemoglobin: 12 g/dL (ref 11.1–15.9)
Immature Grans (Abs): 0 10*3/uL (ref 0.0–0.1)
Immature Granulocytes: 0 %
Lymphocytes Absolute: 1.2 10*3/uL (ref 0.7–3.1)
Lymphs: 29 %
MCH: 27.1 pg (ref 26.6–33.0)
MCHC: 32.4 g/dL (ref 31.5–35.7)
MCV: 84 fL (ref 79–97)
Monocytes Absolute: 0.2 10*3/uL (ref 0.1–0.9)
Monocytes: 6 %
Neutrophils Absolute: 2.8 10*3/uL (ref 1.4–7.0)
Neutrophils: 63 %
Platelets: 180 10*3/uL (ref 150–450)
RBC: 4.43 x10E6/uL (ref 3.77–5.28)
RDW: 13.6 % (ref 11.7–15.4)
WBC: 4.3 10*3/uL (ref 3.4–10.8)

## 2020-03-19 LAB — COMPREHENSIVE METABOLIC PANEL
ALT: 10 IU/L (ref 0–32)
AST: 17 IU/L (ref 0–40)
Albumin/Globulin Ratio: 1.4 (ref 1.2–2.2)
Albumin: 4.1 g/dL (ref 3.8–4.8)
Alkaline Phosphatase: 88 IU/L (ref 44–121)
BUN/Creatinine Ratio: 18 (ref 12–28)
BUN: 13 mg/dL (ref 8–27)
Bilirubin Total: 0.3 mg/dL (ref 0.0–1.2)
CO2: 23 mmol/L (ref 20–29)
Calcium: 9.5 mg/dL (ref 8.7–10.3)
Chloride: 103 mmol/L (ref 96–106)
Creatinine, Ser: 0.72 mg/dL (ref 0.57–1.00)
GFR calc Af Amer: 103 mL/min/{1.73_m2} (ref >59)
GFR calc non Af Amer: 89 mL/min/{1.73_m2} (ref >59)
Globulin, Total: 3 g/dL (ref 1.5–4.5)
Glucose: 94 mg/dL (ref 65–99)
Potassium: 4.5 mmol/L (ref 3.5–5.2)
Sodium: 142 mmol/L (ref 134–144)
Total Protein: 7.1 g/dL (ref 6.0–8.5)

## 2020-03-19 LAB — PHENYTOIN LEVEL, TOTAL: Phenytoin (Dilantin), Serum: 10.9 ug/mL (ref 10.0–20.0)

## 2020-03-23 ENCOUNTER — Telehealth: Payer: Self-pay

## 2020-03-23 NOTE — Telephone Encounter (Signed)
Pt verified by name and DOB,  normal results given per provider, pt voiced understanding all question answered.  Pt asked to speak to team member who checked her in, told her she is unavailable.

## 2020-03-23 NOTE — Telephone Encounter (Signed)
-----   Message from Glean Salvo, NP sent at 03/22/2020 11:30 AM EST ----- Blood work is within normal limits.

## 2020-04-08 ENCOUNTER — Telehealth: Payer: Self-pay | Admitting: *Deleted

## 2020-04-08 NOTE — Telephone Encounter (Signed)
LMVM for pt that received letter, will address more when in for next appt 03-2021.

## 2020-04-08 NOTE — Telephone Encounter (Signed)
We can discuss this at next visit Feb 2023, Dr. Epimenio Foot won't be able to take all the MS patients. Fortunately, she has been stable.

## 2020-04-08 NOTE — Telephone Encounter (Signed)
Received letter in mail, Dr. Anne Hahn retiring and pt requesting to have new provider Dr. Epimenio Foot (MS pt).

## 2020-07-20 ENCOUNTER — Other Ambulatory Visit: Payer: Self-pay

## 2020-07-20 MED ORDER — REBIF REBIDOSE 44 MCG/0.5ML ~~LOC~~ SOAJ: 44.0000 ug | SUBCUTANEOUS | 12 refills | Status: DC

## 2020-07-22 ENCOUNTER — Telehealth: Payer: Self-pay | Admitting: Neurology

## 2020-07-22 MED ORDER — REBIF REBIDOSE 44 MCG/0.5ML ~~LOC~~ SOAJ: 44.0000 ug | SUBCUTANEOUS | 12 refills | Status: DC

## 2020-07-22 NOTE — Telephone Encounter (Signed)
Pt called stating that her Interferon Beta-1a (REBIF REBIDOSE) 44 MCG/0.5ML SOAJ was sent to the wrong pharmacy. Pt states it needs to be faxed to [facility] at fax [phone]

## 2020-07-22 NOTE — Telephone Encounter (Signed)
[facility]ed to [facility],  [facility] order sent to [facility]

## 2020-07-26 ENCOUNTER — Telehealth: Payer: Self-pay | Admitting: Neurology

## 2020-07-26 MED ORDER — REBIF REBIDOSE 44 MCG/0.5ML ~~LOC~~ SOAJ: 44.0000 ug | SUBCUTANEOUS | 12 refills | Status: DC

## 2020-07-26 NOTE — Telephone Encounter (Signed)
I called [facility] and spoke to [facility], she stated that the pt is getting free drug thru [facility] in [place].

## 2020-07-26 NOTE — Telephone Encounter (Signed)
I called [facility] and pt refused prescription with them.  Is getting thru pt assistance. Called [facility].

## 2020-07-26 NOTE — Telephone Encounter (Signed)
[facility] called stating that the pt is needing a new RX sent in for the pt. for her Interferon Beta-1a (REBIF REBIDOSE) 44 MCG/0.5ML SOAJ. Please advise.

## 2020-07-28 NOTE — Telephone Encounter (Signed)
I called [facility] and spoke to Casimiro Needle.  I relayed that pt is getting thru pt assistance ([facility]) and per previously noted when called [facility] previously pt had refused.  He stated that he placed that information to cancel in their system.

## 2020-07-28 NOTE — Telephone Encounter (Signed)
Nyla from [facility] is requesting a call back for Authorization for Interferon Beta-1a (REBIF REBIDOSE) 44 MCG/0.5ML SOAJ. Please advise.

## 2020-08-12 ENCOUNTER — Other Ambulatory Visit: Payer: Self-pay | Admitting: Internal Medicine

## 2020-08-12 DIAGNOSIS — Z1231 Encounter for screening mammogram for malignant neoplasm of breast: Secondary | ICD-10-CM

## 2020-10-07 ENCOUNTER — Ambulatory Visit
Admission: RE | Admit: 2020-10-07 | Discharge: 2020-10-07 | Disposition: A | Discharge status: Home / Self Care | Payer: Medicare Other | Source: Ambulatory Visit | Attending: Internal Medicine | Admitting: Internal Medicine

## 2020-10-07 ENCOUNTER — Other Ambulatory Visit: Payer: Self-pay

## 2020-10-07 DIAGNOSIS — Z1231 Encounter for screening mammogram for malignant neoplasm of breast: Secondary | ICD-10-CM

## 2020-10-18 ENCOUNTER — Ambulatory Visit (HOSPITAL_BASED_OUTPATIENT_CLINIC_OR_DEPARTMENT_OTHER): Payer: BC Managed Care – PPO | Admitting: Family

## 2021-02-07 NOTE — Progress Notes (Signed)
Cardiology Office Note    Date:  02/09/2021   ID:  Sherri Ramirez, DOB 07/23/1955, MRN [id number]  PCP:  Willey Blade, MD  Cardiologist:  Freada Bergeron, MD  Electrophysiologist:  None   Chief Complaint: f/u atrial flutter, pericarditis  History of Present Illness:   Sherri Ramirez is a 66 y.o. female with history of multiple sclerosis (followed at [facility]), remote seizures, remote tobacco abuse, atrial flutter in the setting of pericarditis 2018, hypokalemia, anemia, mild MR who presents for follow-up.  She was remotely admitted 11/2016 with chest pain, SOB, chills, and tachycardia. She was found to have atrial flutter, treated with IV diltiazem + amiodarone and converted to NSR. She also reported chest pain with inspiration and was felt to have pericarditis. She was treated with ibuprofen and colchicine. She was also treated with IV Lasix for small pleural effusions. She was started on aspirin only daily as her CHADSVASC was felt to be one for female only. Dr. Meda Coffee felt she would be a difficult anticoagulation candidate given anemia, interaction with phenytoin, and fall risk thus recommended aspirin only.  2D Echo 12/27/15 showed normal EF 123456, normal diastolic function, aortic sclerosis, moderate LAE, mild MR, RVSP 26. She also had anemia that admission of unclear cause. She went on to have coronary CTA 01/2017 with no evidence of CAD. There was aortic atherosclerosis mentioned in the overread but not in the coronary CTA itself.  She is seen back for follow-up overall doing well from cardiac standpoint without any CP, SOB, palpitations, syncope, heart fluttering, or stroke symptoms. She feels she's doing "great." She did have Covid around the holidays but feels she has recovered nicely from this.  Labwork independently reviewed: 03/2020 CMET, CBC wnl 11/2016 TSH wnl, free T4 elev, free T3 decreased, LDL 109   Cardiology Studies:   Studies reviewed are outlined and summarized  above. Reports included below if pertinent.   2D Echo 07/2017 - Left ventricle: The cavity size was normal. Wall thickness was    normal. Systolic function was normal. The estimated ejection    fraction was in the range of 55% to 60%. Wall motion was normal;    there were no regional wall motion abnormalities.   Impressions:   - Normal LV function; mild TR.   Coronary CTA 01/2017 CLINICAL DATA:  71 -year-old female with h/o smoking and atypical chest pain.   EXAM: Cardiac/Coronary  CT   TECHNIQUE: The patient was scanned on a [employer].   FINDINGS: A 120 kV prospective scan was triggered in the descending thoracic aorta at 111 HU's. Axial non-contrast 3 mm slices were carried out through the heart. The data set was analyzed on a dedicated work station and scored using the [place]. Gantry rotation speed was 250 msecs and collimation was .6 mm. 10 mg of iv Metoprolol and 0.8 mg of sl NTG was given. The 3D data set was reconstructed in 5% intervals of the 67-82 % of the R-R cycle. Diastolic phases were analyzed on a dedicated work station using MPR, MIP and VRT modes. The patient received 80 cc of contrast.   Aorta:  Normal size.  No calcifications.  No dissection.   Aortic Valve:  Trileaflet.  No calcifications.   Coronary Arteries:  Normal coronary origin.  Right dominance.   RCA is a large dominant artery that gives rise to PDA and PLVB. There is no plaque.   Left main is a very large artery that gives rise  to LAD and LCX arteries. There is no plaque.   LAD is a large vessel that gives rise to two diagonal arteries and has no plaque.   LCX is a non-dominant artery that gives rise to one OM1 branch. There is no plaque.   Other findings:   Normal pulmonary vein drainage into the left atrium.   Normal let atrial appendage without a thrombus.   Normal size of the pulmonary artery.   IMPRESSION: 1. Coronary calcium score of 0. This was 0  percentile for age and sex matched control.   2. Normal coronary origin with right dominance.   3. No evidence of CAD.     Electronically Signed   By: Ena Dawley   On: 02/13/2017 19:09CLINICAL DATA:  26 -year-old female with h/o smoking and atypical chest pain.   EXAM: Cardiac/Coronary  CT   TECHNIQUE: The patient was scanned on a [employer].   FINDINGS: A 120 kV prospective scan was triggered in the descending thoracic aorta at 111 HU's. Axial non-contrast 3 mm slices were carried out through the heart. The data set was analyzed on a dedicated work station and scored using the [place]. Gantry rotation speed was 250 msecs and collimation was .6 mm. 10 mg of iv Metoprolol and 0.8 mg of sl NTG was given. The 3D data set was reconstructed in 5% intervals of the 67-82 % of the R-R cycle. Diastolic phases were analyzed on a dedicated work station using MPR, MIP and VRT modes. The patient received 80 cc of contrast.   Aorta:  Normal size.  No calcifications.  No dissection.   Aortic Valve:  Trileaflet.  No calcifications.   Coronary Arteries:  Normal coronary origin.  Right dominance.   RCA is a large dominant artery that gives rise to PDA and PLVB. There is no plaque.   Left main is a very large artery that gives rise to LAD and LCX arteries. There is no plaque.   LAD is a large vessel that gives rise to two diagonal arteries and has no plaque.   LCX is a non-dominant artery that gives rise to one OM1 branch. There is no plaque.   Other findings:   Normal pulmonary vein drainage into the left atrium.   Normal let atrial appendage without a thrombus.   Normal size of the pulmonary artery.   IMPRESSION: 1. Coronary calcium score of 0. This was 0 percentile for age and sex matched control.   2. Normal coronary origin with right dominance.   3. No evidence of CAD.     Electronically Signed   By: Ena Dawley   On: 02/13/2017  19:09   IMPRESSION: 1. Small left pleural effusion. 2. Bibasilar chronic scarring and/or subsegmental atelectasis in the lower lobes of lungs bilaterally. 3.  Aortic Atherosclerosis (ICD10-I70.0).   Electronically Signed: By: Vinnie Langton M.D. On: 02/13/2017 16:40  CT Angio PE 12/2016 IMPRESSION: No evidence of pulmonary embolus.   Small left pleural effusion.  Bibasilar atelectasis.     Electronically Signed   By: Rolm Baptise M.D.   On: 01/17/2017 13:57    Past Medical History:  Diagnosis Date   Anemia    a. drop from from 12->8.6 in 11/2016.   Atrial flutter, paroxysmal ([place])    a. dx 11/2016.   Former tobacco use    Hypokalemia    MS (multiple sclerosis) ([place])    Pericarditis    a. dx 11/2016.   Seizure ([place])  Volume overload    a. in setting of atrial flutter 11/2016. Echo actually showed normal EF and normal diastolic function so was likely due to atrial flutter.    Past Surgical History:  Procedure Laterality Date   ABDOMINAL HYSTERECTOMY     BREAST EXCISIONAL BIOPSY Left pt unsure   benign   HYSTEROTOMY      Current Medications: Current Meds  Medication Sig   albuterol (VENTOLIN HFA) 108 (90 Base) MCG/ACT inhaler Proventil HFA 90 mcg/actuation aerosol inhaler  Inhale 2 puffs every 4 hours by inhalation route.   aspirin EC 81 MG EC tablet Take 1 tablet (81 mg total) by mouth daily.   Interferon Beta-1a (REBIF REBIDOSE) 44 MCG/0.5ML SOAJ Inject 44 mcg into the skin 3 (three) times a week.   phenytoin (DILANTIN) 100 MG ER capsule Take 3 capsules (300 mg total) by mouth daily.     Allergies:   Carbamazepine and Glatiramer acetate   Social History   Socioeconomic History   Marital status: Married    Spouse name: [name]   Number of children: 0   Years of education: Not on file   Highest education level: Not on file  Occupational History   Not on file  Tobacco Use   Smoking status: Former   Smokeless tobacco: Never  Vaping Use   Vaping  Use: Never used  Substance and Sexual Activity   Alcohol use: No   Drug use: No   Sexual activity: Not on file  Other Topics Concern   Not on file  Social History Narrative   Patient is married and does not work.    Patient quit smoking in 06/06/05.   Social Determinants of Health   Financial Resource Strain: Not on file  Food Insecurity: Not on file  Transportation Needs: Not on file  Physical Activity: Not on file  Stress: Not on file  Social Connections: Not on file     Family History:  The patient's family history includes Colon cancer in her mother; Heart attack in her father.  ROS:   Please see the history of present illness.  All other systems are reviewed and otherwise negative.    EKG(s)/Additional Labs   EKG:  EKG is ordered today, personally reviewed, demonstrating NSR 78bpm, nonspecific TW changes in lead III, V2-V3. These TW changes appear variable back to 2016/06/06 (more pronounced then)  Recent Labs: 03/18/2020: ALT 10; BUN 13; Creatinine, Ser 0.72; Hemoglobin 12.0; Platelets 180; Potassium 4.5; Sodium 142  Recent Lipid Panel    Component Value Date/Time   CHOL 165 12/26/2016 0234   TRIG 67 12/26/2016 0234   HDL 43 12/26/2016 0234   CHOLHDL 3.8 12/26/2016 0234   VLDL 13 12/26/2016 0234   LDLCALC 109 (H) 12/26/2016 0234    PHYSICAL EXAM:    VS:  BP 110/80    Pulse 78    Ht 5\' 2"  (1.575 m)    Wt 104 lb 9.6 oz (47.4 kg)    SpO2 99%    BMI 19.13 kg/m   BMI: Body mass index is 19.13 kg/m.  GEN: Thin female in no acute distress HEENT: normocephalic, atraumatic Neck: no JVD, carotid bruits, or masses Cardiac: RRR; no murmurs, rubs, or gallops, no edema  Respiratory:  clear to auscultation bilaterally, normal work of breathing GI: soft, nontender, nondistended, + BS MS: no deformity or atrophy Skin: warm and dry, no rash Neuro:  Alert and Oriented x 3, Strength and sensation are intact, follows commands Psych: euthymic mood, full  affect  Wt Readings from  Last 3 Encounters:  02/09/21 104 lb 9.6 oz (47.4 kg)  03/18/20 102 lb (46.3 kg)  04/22/19 106 lb 3.2 oz (48.2 kg)     ASSESSMENT & PLAN:   1. Paroxysmal atrial flutter - occurred in setting of acute pericarditis, converted to NSR, without clinical recurrence since that time. Not on anticoagulation for reasons above, only on baby aspirin as CHADSVASC was historically only 1. She will be turning 66 later this year. If she were to develop clinical recurrence of atrial flutter will need to revisit question of anticoagulation. She will notify for any new symptoms - she states it was very obvious when she was out of rhythm in the past.  2. History of pericarditis - quiescent without recurrent symptoms.  3. Aortic atherosclerosis - coronary CTA states no aortic calcifications but overread indicated some aortic atherosclerosis. We discussed updating her bloodwork today including her cholesterol since I do not have any recent values on file. She declines labs today and would prefer to continue to follow with Dr. Willey Blade for all her updated labs.  4. Mild mitral regurgitation - noted on echo 2017. Guidelines suggest updating between 3-5 years, will arrange repeat echocardiogram.    Disposition: F/u with Dr Johney Frame in 1 year.   Medication Adjustments/Labs and Tests Ordered: Current medicines are reviewed at length with the patient today.  Concerns regarding medicines are outlined above. Medication changes, Labs and Tests ordered today are summarized above and listed in the Patient Instructions accessible in Encounters.   Signed, Charlie Pitter, PA-C  02/09/2021 2:32 PM    [place] Phone: [phone]; Fax: [phone]

## 2021-02-09 ENCOUNTER — Encounter: Payer: Self-pay | Admitting: Physician Assistant

## 2021-02-09 ENCOUNTER — Encounter (INDEPENDENT_AMBULATORY_CARE_PROVIDER_SITE_OTHER): Payer: Self-pay

## 2021-02-09 ENCOUNTER — Ambulatory Visit: Payer: Medicare Other | Admitting: Physician Assistant

## 2021-02-09 ENCOUNTER — Other Ambulatory Visit: Payer: Self-pay

## 2021-02-09 VITALS — BP 110/80 | HR 78 | Ht 62.0 in | Wt 104.6 lb

## 2021-02-09 DIAGNOSIS — I340 Nonrheumatic mitral (valve) insufficiency: Secondary | ICD-10-CM

## 2021-02-09 DIAGNOSIS — I4892 Unspecified atrial flutter: Secondary | ICD-10-CM

## 2021-02-09 DIAGNOSIS — Z8679 Personal history of other diseases of the circulatory system: Secondary | ICD-10-CM | POA: Diagnosis not present

## 2021-02-09 DIAGNOSIS — I700 Atherosclerosis of aorta: Secondary | ICD-10-CM | POA: Diagnosis not present

## 2021-02-09 NOTE — Patient Instructions (Signed)
Medication Instructions:  Your physician recommends that you continue on your current medications as directed. Please refer to the Current Medication list given to you today.  *If you need a refill on your cardiac medications before your next appointment, please call your pharmacy*   Lab Work: None ordered  If you have labs (blood work) drawn today and your tests are completely normal, you will receive your results only by: MyChart Message (if you have MyChart) OR A paper copy in the mail If you have any lab test that is abnormal or we need to change your treatment, we will call you to review the results.   Testing/Procedures: Your physician has requested that you have an echocardiogram. Echocardiography is a painless test that uses sound waves to create images of your heart. It provides your doctor with information about the size and shape of your heart and how well your hearts chambers and valves are working. This procedure takes approximately one hour. There are no restrictions for this procedure.    Follow-Up: At [facility], you and your health needs are our priority.  As part of our continuing mission to provide you with exceptional heart care, we have created designated Provider Care Teams.  These Care Teams include your primary Cardiologist (physician) and Advanced Practice Providers (APPs -  Physician Assistants and Nurse Practitioners) who all work together to provide you with the care you need, when you need it.  We recommend signing up for the patient portal called "MyChart".  Sign up information is provided on this After Visit Summary.  MyChart is used to connect with patients for Virtual Visits (Telemedicine).  Patients are able to view lab/test results, encounter notes, upcoming appointments, etc.  Non-urgent messages can be sent to your provider as well.   To learn more about what you can do with MyChart, go to [email or web address].    Your next appointment:   12  month(s)  The format for your next appointment:   In Person  Provider:   Meriam Sprague, MD  or Ronie Spies, PA-C         Other Instructions

## 2021-03-02 IMAGING — MG DIGITAL SCREENING BILAT W/ TOMO W/ CAD
8 series · 9 of 24 positions shown · non-contrast
Comparison: Previous exam(s).

CLINICAL DATA: Screening.

EXAM:
DIGITAL SCREENING BILATERAL MAMMOGRAM WITH TOMO AND CAD

[R CC synth-2D]
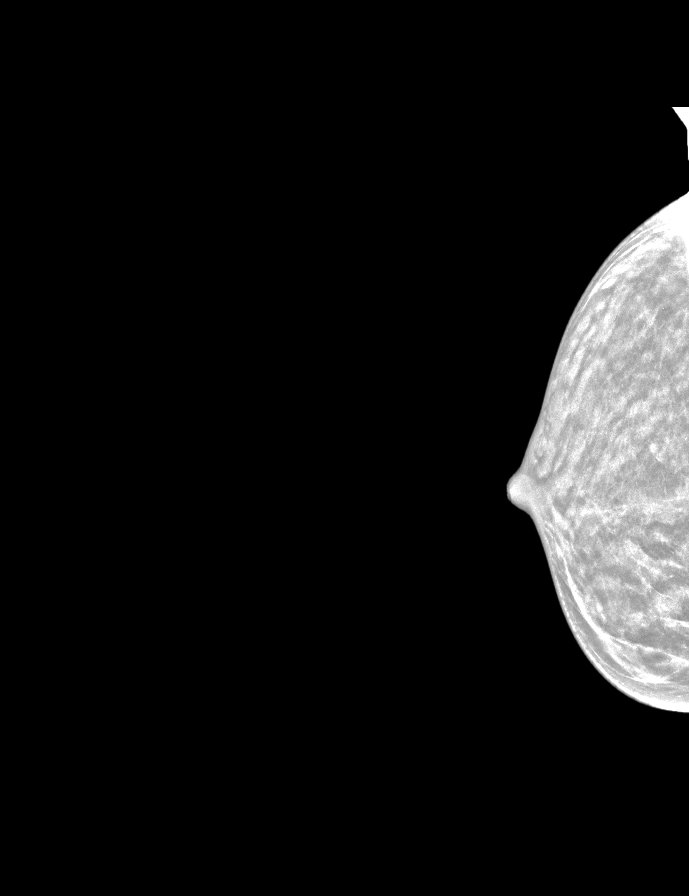

[R MLO synth-2D]
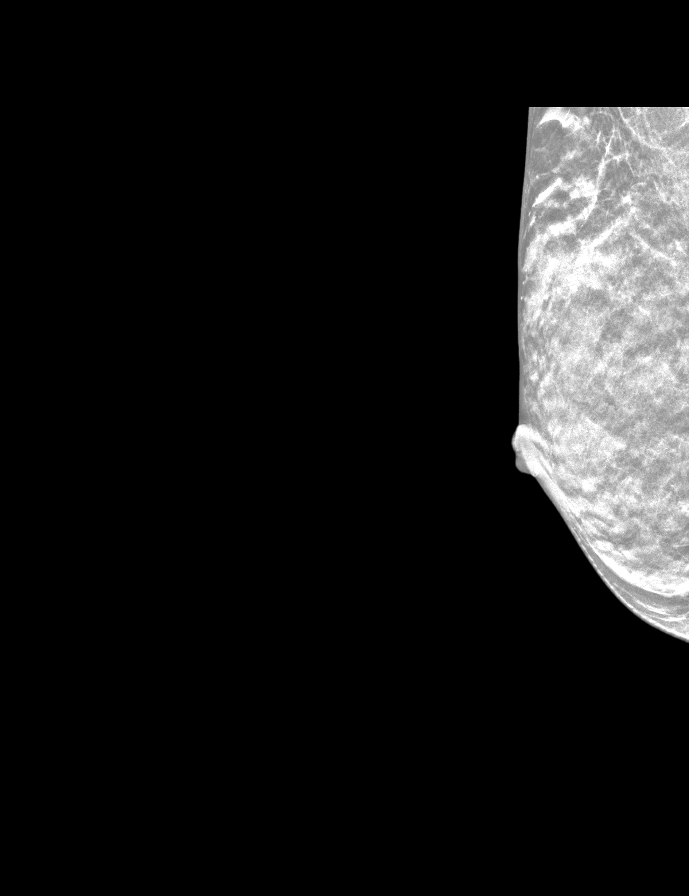

[L MLO synth-2D]
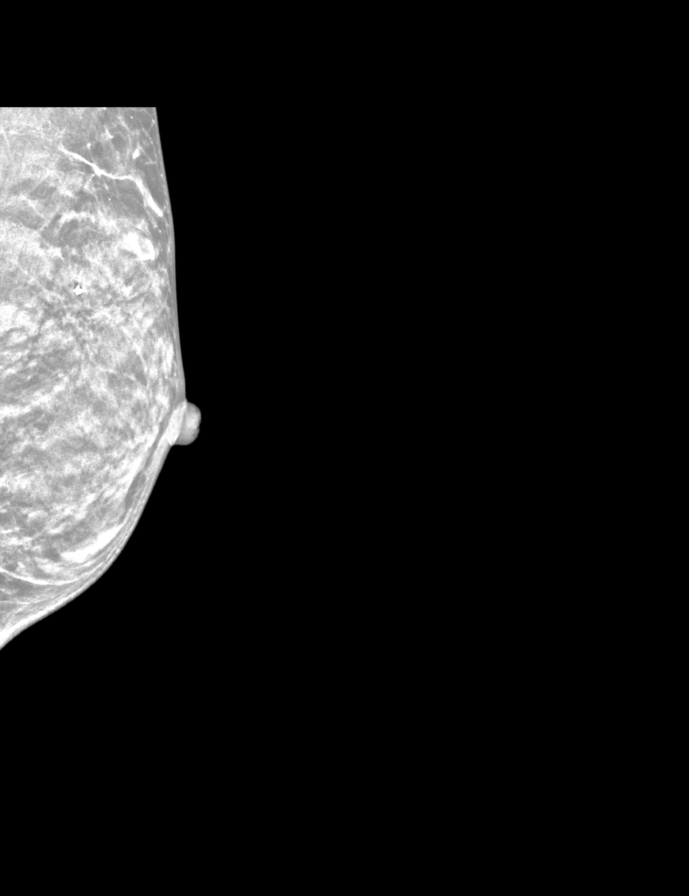

[L CC synth-2D]
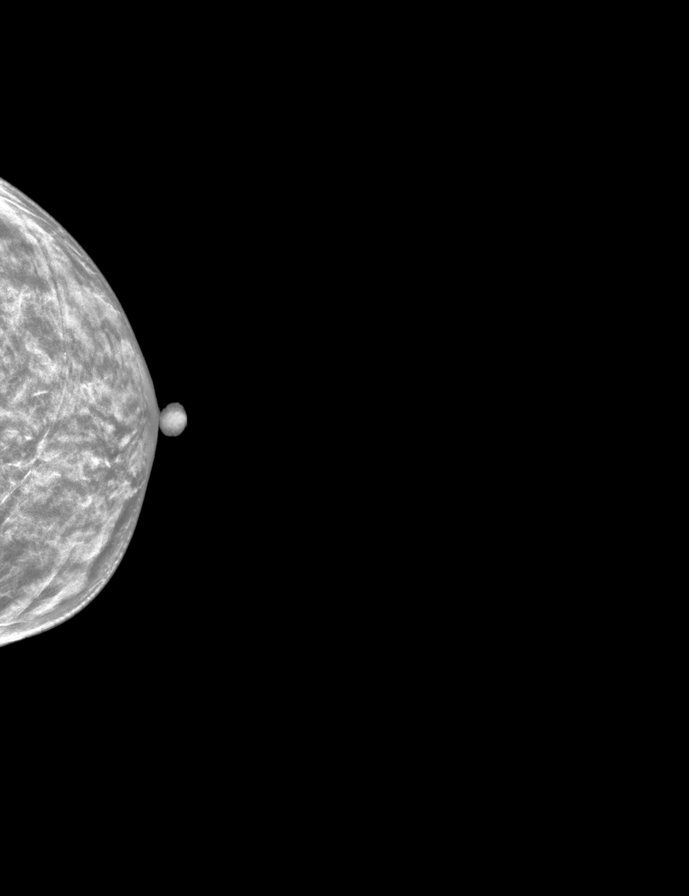

[L CC tomo · 2 of 39 frames shown]
[frame 13/39]
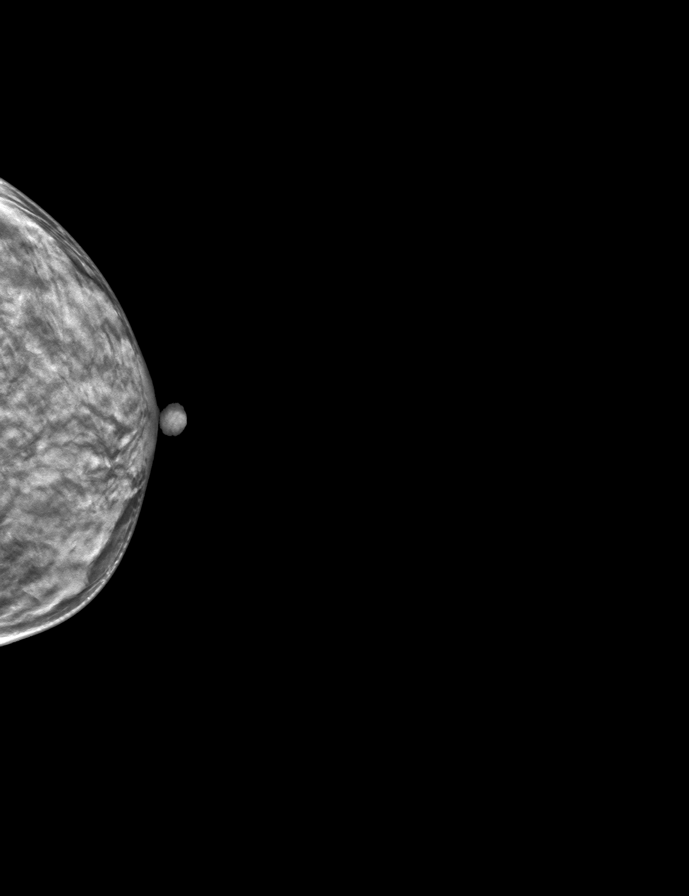
[frame 20/39]
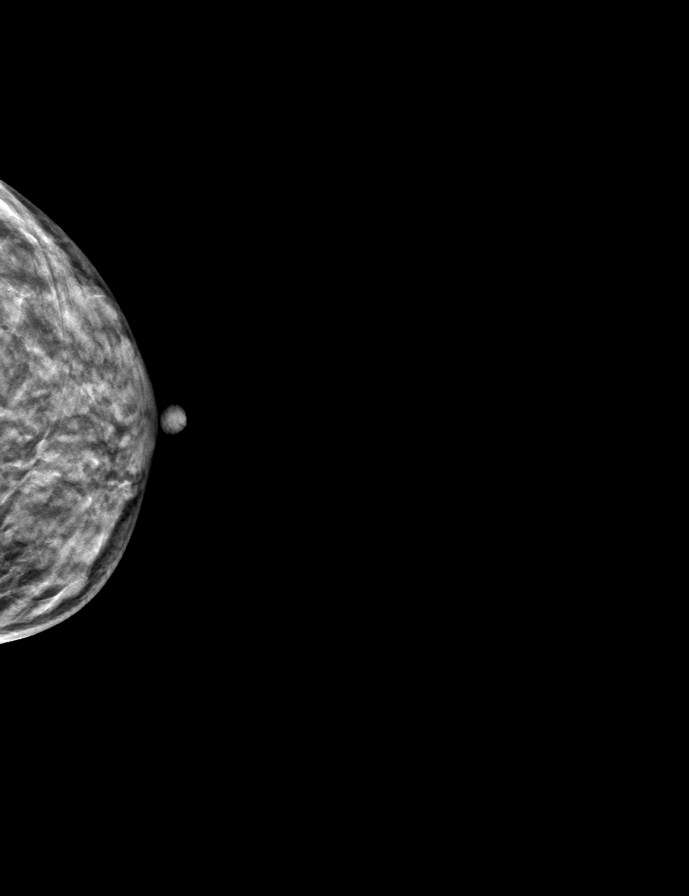

[R CC tomo · tomo slice 17/33.0]
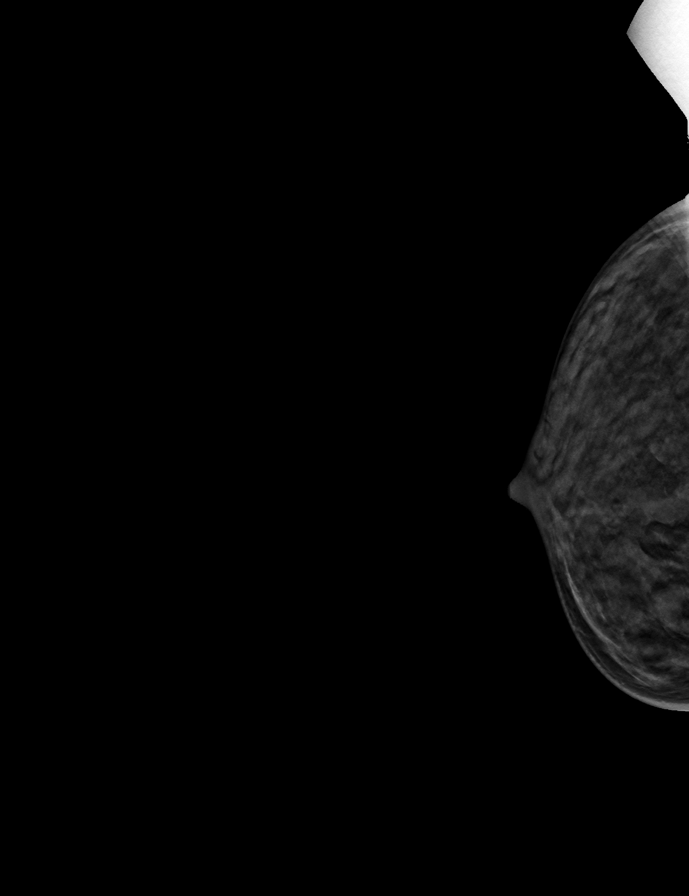

[L MLO tomo · tomo slice 17/34.0]
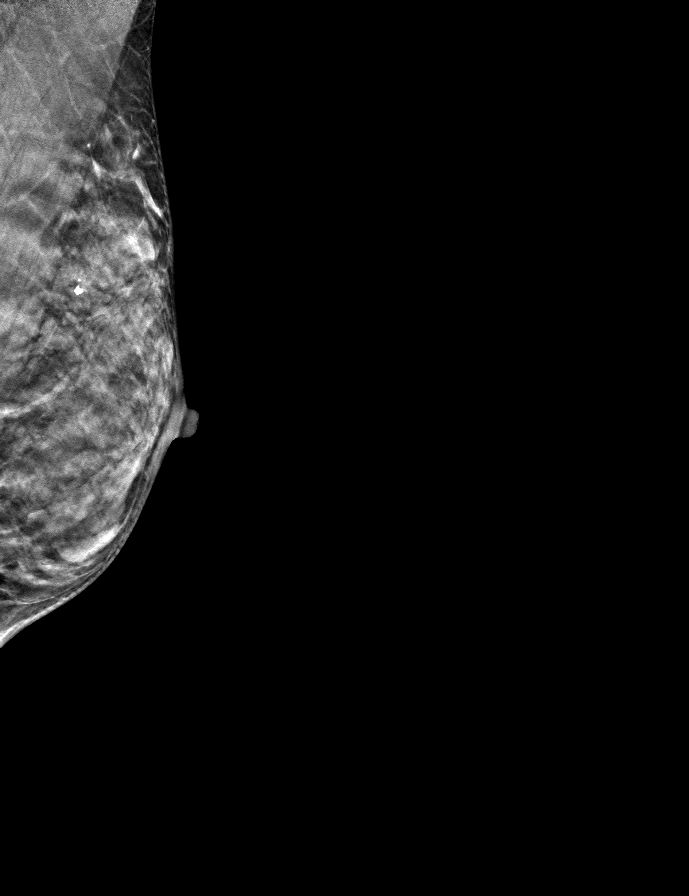

[R MLO tomo · tomo slice 17/33.0]
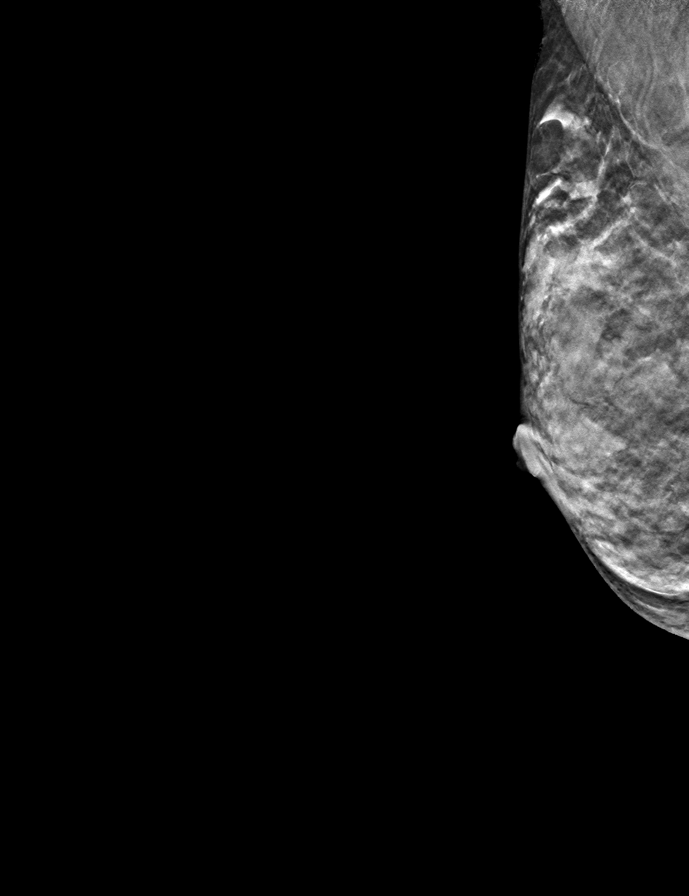

[9 of 24 positions shown; findings below may reference images not displayed]

ACR Breast Density Category c: The breast tissue is heterogeneously
dense, which may obscure small masses.
FINDINGS: There are no findings suspicious for malignancy. Images were
processed with CAD.
IMPRESSION: No mammographic evidence of malignancy. A result letter of this
screening mammogram will be mailed directly to the patient.

RECOMMENDATION:
Screening mammogram in one year. (Code:[id number]-[id number])

BI-RADS CATEGORY  1: Negative.

## 2021-03-17 ENCOUNTER — Ambulatory Visit: Payer: Medicare Other | Admitting: Neurology

## 2021-03-17 ENCOUNTER — Other Ambulatory Visit: Payer: Self-pay

## 2021-03-17 ENCOUNTER — Encounter: Payer: Self-pay | Admitting: Neurology

## 2021-03-17 VITALS — BP 128/70 | HR 74 | Ht 62.0 in | Wt 103.0 lb

## 2021-03-17 DIAGNOSIS — G35 Multiple sclerosis: Secondary | ICD-10-CM | POA: Diagnosis not present

## 2021-03-17 DIAGNOSIS — G40209 Localization-related (focal) (partial) symptomatic epilepsy and epileptic syndromes with complex partial seizures, not intractable, without status epilepticus: Secondary | ICD-10-CM | POA: Diagnosis not present

## 2021-03-17 MED ORDER — PHENYTOIN SODIUM EXTENDED 100 MG PO CAPS: 300.0000 mg | ORAL_CAPSULE | Freq: Every day | ORAL | 4 refills | Status: DC

## 2021-03-17 NOTE — Progress Notes (Addendum)
PATIENT: Sherri Ramirez DOB: [birth year]  REASON FOR VISIT: follow up for MS and seizures HISTORY FROM: patient PRIMARY NEUROLOGIST: Dr. Felecia Shelling   HISTORY OF PRESENT ILLNESS: Today 03/17/21 Sherri Ramirez here today for follow-up with history of MS on Rebif.  History of seizures well-controlled on Dilantin since 1999.  Tells me she is only had 1 seizure, described as her arms raising, her neurologist at the time put her on Dilantin then, none since.  Feels MS is stable, does have some weakness to the right leg with walking chronically.  She has no complaints today.  She has not wanted to pursue routine MRIs.  She works as a Scientist, water quality at [employer].  Sees her primary care doctor. Got COVID at Christmas. I was able to review lab work:  01/12/21: Glucose 93, creatinine 0.81, AST 16, ALT 13, WBC 5, Hgb 11.8, platelets 194, TSH 1.280, vitamin D low 11.3 (now on supplement).  Update 03/18/2020 SS: Sherri Ramirez is a 66 year old female with history of multiple sclerosis. Is doing well on Rebif.  Last MRI of the brain was in 2004, she has not wanted to pursue further MRIs.  Has history of seizure disorder, well-controlled on Dilantin since 1999.  MS diagnosed in March 2004, on Rebif since 2015. Feels MS remains overall stable. She has a chronic mild gait problem with the right leg. Occasionally, she may have a right leg muscle spasm. She has no complaints today. She works part-time as a Scientist, water quality at [employer]. Here today for evaluation unaccompanied.  HISTORY 11/20/2018 SS:  Sherri Ramirez is a 66 year old female with history of multiple sclerosis.  She remains on Rebif without side effect.  Her last MRI of the brain was in 2004.  She has not wanted to pursue further MRI of the brain, as she feels her disease has been stable.  She has history of seizure disorder, has been well controlled on Dilantin since 1999.  Her multiple sclerosis was diagnosed in March 2004.  She has been on Rebif since 2015.  She denies any new  numbness, weakness, changes to the bowels or bladder, or vision changes.  She has not had any falls.  She is currently working part-time at [employer].  She is able to perform all her own ADLs.  She does operate a motor vehicle without difficulty.  She denies any new medical problems.  She has routine follow-up with her primary doctor.  She was upset that she received a letter in the mail regarding her missed appointment when she was ill 10/16/2018.  She presents today for follow-up unaccompanied.  REVIEW OF SYSTEMS: Out of a complete 14 system review of symptoms, the patient complains only of the following symptoms, and all other reviewed systems are negative.  n/a  ALLERGIES: Allergies  Allergen Reactions   Carbamazepine    Glatiramer Acetate     REACTION: anaphylaxis    HOME MEDICATIONS: Outpatient Medications Prior to Visit  Medication Sig Dispense Refill   albuterol (VENTOLIN HFA) 108 (90 Base) MCG/ACT inhaler Proventil HFA 90 mcg/actuation aerosol inhaler  Inhale 2 puffs every 4 hours by inhalation route.     aspirin EC 81 MG EC tablet Take 1 tablet (81 mg total) by mouth daily. 30 tablet 3   Interferon Beta-1a (REBIF REBIDOSE) 44 MCG/0.5ML SOAJ Inject 44 mcg into the skin 3 (three) times a week. 6 mL 12   VITAMIN D PO Take 1 tablet by mouth daily.     phenytoin (DILANTIN)  100 MG ER capsule Take 3 capsules (300 mg total) by mouth daily. 270 capsule 4   No facility-administered medications prior to visit.    PAST MEDICAL HISTORY: Past Medical History:  Diagnosis Date   Anemia    a. drop from from 12->8.6 in 11/2016.   Atrial flutter, paroxysmal ([place])    a. dx 11/2016.   Former tobacco use    Hypokalemia    MS (multiple sclerosis) ([place])    Pericarditis    a. dx 11/2016.   Seizure ([place])    Volume overload    a. in setting of atrial flutter 11/2016. Echo actually showed normal EF and normal diastolic function so was likely due to atrial flutter.    PAST SURGICAL  HISTORY: Past Surgical History:  Procedure Laterality Date   ABDOMINAL HYSTERECTOMY     BREAST EXCISIONAL BIOPSY Left pt unsure   benign   HYSTEROTOMY      FAMILY HISTORY: Family History  Problem Relation Age of Onset   Colon cancer Mother    Heart attack Father     SOCIAL HISTORY: Social History   Socioeconomic History   Marital status: Married    Spouse name: [name]   Number of children: 0   Years of education: Not on file   Highest education level: Not on file  Occupational History   Not on file  Tobacco Use   Smoking status: Former   Smokeless tobacco: Never  Vaping Use   Vaping Use: Never used  Substance and Sexual Activity   Alcohol use: No   Drug use: No   Sexual activity: Not on file  Other Topics Concern   Not on file  Social History Narrative   Patient is married and does not work.    Patient quit smoking in 2007.   Social Determinants of Health   Financial Resource Strain: Not on file  Food Insecurity: Not on file  Transportation Needs: Not on file  Physical Activity: Not on file  Stress: Not on file  Social Connections: Not on file  Intimate Partner Violence: Not on file   PHYSICAL EXAM  Vitals:   03/17/21 1230  BP: 128/70  Pulse: 74  Weight: 103 lb (46.7 kg)  Height: 5\' 2"  (1.575 m)    Body mass index is 18.84 kg/m.  Generalized: Well developed, in no acute distress   Neurological examination  Mentation: Alert oriented to time, place, history taking. Follows all commands speech and language fluent Cranial nerve II-XII: Pupils were equal round reactive to light. Extraocular movements were full, visual field were full on confrontational test. Facial sensation and strength were normal. Head turning and shoulder shrug  were normal and symmetric. Motor: Mild right 4/5 hip flexion, otherwise strong throughout Sensory: Sensory testing is intact to soft touch on all 4 extremities. No evidence of extinction is noted.  Coordination:  Cerebellar testing reveals good finger-nose-finger and heel-to-shin bilaterally.  Gait and station: has mild limp on the right. Tandem gait is slightly unsteady. Reflexes: Deep tendon reflexes are symmetric and normal bilaterally.   DIAGNOSTIC DATA (LABS, IMAGING, TESTING) - I reviewed patient records, labs, notes, testing and imaging myself where available.  Lab Results  Component Value Date   WBC 4.3 03/18/2020   HGB 12.0 03/18/2020   HCT 37.0 03/18/2020   MCV 84 03/18/2020   PLT 180 03/18/2020      Component Value Date/Time   NA 142 03/18/2020 1334   K 4.5 03/18/2020 1334   CL 103 03/18/2020  1334   CO2 23 03/18/2020 1334   GLUCOSE 94 03/18/2020 1334   GLUCOSE 93 12/29/2016 0423   BUN 13 03/18/2020 1334   CREATININE 0.72 03/18/2020 1334   CALCIUM 9.5 03/18/2020 1334   PROT 7.1 03/18/2020 1334   ALBUMIN 4.1 03/18/2020 1334   AST 17 03/18/2020 1334   ALT 10 03/18/2020 1334   ALKPHOS 88 03/18/2020 1334   BILITOT 0.3 03/18/2020 1334   GFRNONAA 89 03/18/2020 1334   GFRAA 103 03/18/2020 1334   Lab Results  Component Value Date   CHOL 165 12/26/2016   HDL 43 12/26/2016   LDLCALC 109 (H) 12/26/2016   TRIG 67 12/26/2016   CHOLHDL 3.8 12/26/2016   Lab Results  Component Value Date   HGBA1C 5.0 12/26/2016   Lab Results  Component Value Date   VITAMINB12 388 12/26/2016   Lab Results  Component Value Date   TSH 0.828 12/25/2016   ASSESSMENT AND PLAN 66 y.o. year old female  has a past medical history of Anemia, Atrial flutter, paroxysmal ([place]), Former tobacco use, Hypokalemia, MS (multiple sclerosis) ([place]), Pericarditis, Seizure ([place]), and Volume overload. here with:  1.  Multiple sclerosis 2.  History of seizures  -Doing well overall, her chronic conditions have remained stable -Will continue Rebif for MS, Dilantin for seizures -Was able to review recent labs from PCP, Dilantin level has been stable, was 10.10 Mar 2020 without any change to dosing -She has decided  not to pursue routine MRIs for MS surveillance -Follow-up in 1 year or sooner if needed, will be followed by Dr. Moshe Salisbury, AGNP-C, DNP 03/17/2021, 1:04 PM [facility] [address] [place],  [address] [phone]   I have read the note, and I agree with the clinical assessment and plan.  Richard A. Felecia Shelling, MD, PhD, [facility] Certified in Neurology, Clinical Neurophysiology, Sleep Medicine, Pain Medicine and Neuroimaging  [facility] [address] [place],  [address] [phone]

## 2021-04-13 ENCOUNTER — Other Ambulatory Visit: Payer: Self-pay

## 2021-04-13 ENCOUNTER — Ambulatory Visit (HOSPITAL_COMMUNITY): Payer: Medicare Other | Attending: Cardiovascular Disease

## 2021-04-13 DIAGNOSIS — I340 Nonrheumatic mitral (valve) insufficiency: Secondary | ICD-10-CM | POA: Insufficient documentation

## 2021-04-13 LAB — ECHOCARDIOGRAM COMPLETE
Area-P 1/2: 4.04 cm2
S' Lateral: 2.8 cm

## 2021-05-30 ENCOUNTER — Ambulatory Visit (HOSPITAL_COMMUNITY)
Admission: EM | Admit: 2021-05-30 | Discharge: 2021-05-30 | Disposition: A | Discharge status: Home / Self Care | Payer: Medicare Other | Attending: Physician Assistant | Admitting: Physician Assistant

## 2021-05-30 DIAGNOSIS — S61210A Laceration without foreign body of right index finger without damage to nail, initial encounter: Secondary | ICD-10-CM

## 2021-05-30 MED ORDER — TETANUS-DIPHTH-ACELL PERTUSSIS 5-2.5-18.5 LF-MCG/0.5 IM SUSY
PREFILLED_SYRINGE | INTRAMUSCULAR | Status: AC
  Filled 2021-05-30: qty 0.5

## 2021-05-30 MED ORDER — TETANUS-DIPHTH-ACELL PERTUSSIS 5-2.5-18.5 LF-MCG/0.5 IM SUSY
0.5000 mL | PREFILLED_SYRINGE | Freq: Once | INTRAMUSCULAR | Status: AC
Start: 2021-05-30 — End: 2021-05-30
  Administered 2021-05-30: 0.5 mL via INTRAMUSCULAR

## 2021-05-30 NOTE — ED Provider Notes (Signed)
?[facility] ? ? ? ?CSN: [id number] ?Arrival date & time: 05/30/21  1937 ? ? ?  ? ?History   ?Chief Complaint ?Chief Complaint  ?Patient presents with  ? Laceration  ? ? ?HPI ?Sherri Ramirez is a 66 y.o. female.  ? ?Pt cut her right index finger on a pill cutter  ? ?The history is provided by the patient. No language interpreter was used.  ?Laceration ?Location:  Finger ?Length:  0.2 ?Depth:  Cutaneous ?Bleeding: controlled with pressure   ?Laceration mechanism:  Metal edge ?Pain details:  ?  Severity:  No pain ?  Timing:  Constant ? ?Past Medical History:  ?Diagnosis Date  ? Anemia   ? a. drop from from 12->8.6 in 11/2016.  ? Atrial flutter, paroxysmal ([facility])   ? a. dx 11/2016.  ? Former tobacco use   ? Hypokalemia   ? MS (multiple sclerosis) ([facility])   ? Pericarditis   ? a. dx 11/2016.  ? Seizure ([facility])   ? Volume overload   ? a. in setting of atrial flutter 11/2016. Echo actually showed normal EF and normal diastolic function so was likely due to atrial flutter.  ? ? ?Patient Active Problem List  ? Diagnosis Date Noted  ? Idiopathic pericarditis 07/03/2017  ? Contraindication to anticoagulation therapy   ? Acute idiopathic pericarditis   ? SOB (shortness of breath)   ? Atrial flutter with rapid ventricular response ([facility]) 12/25/2016  ? Typical atrial flutter ([facility]) 12/25/2016  ? Chest pain 12/25/2016  ? Encounter for therapeutic drug monitoring 09/21/2015  ? Multiple sclerosis ([facility]) 03/14/2006  ? SINUSITIS, CHRONIC NOS 03/14/2006  ? LYMPHADENOPATHY 03/14/2006  ? Seizure disorder, complex partial ([facility]) 03/14/2006  ? HYSTERECTOMY, HX OF 03/14/2006  ? ? ?Past Surgical History:  ?Procedure Laterality Date  ? ABDOMINAL HYSTERECTOMY    ? BREAST EXCISIONAL BIOPSY Left pt unsure  ? benign  ? HYSTEROTOMY    ? ? ?OB History   ?No obstetric history on file. ?  ? ? ? ?Home Medications   ? ?Prior to Admission medications   ?Medication Sig Start Date End Date Taking? Authorizing Provider  ?albuterol (VENTOLIN HFA) 108 (90 Base)  MCG/ACT inhaler Proventil HFA 90 mcg/actuation aerosol inhaler  Inhale 2 puffs every 4 hours by inhalation route.    [provider]  ?aspirin EC 81 MG EC tablet Take 1 tablet (81 mg total) by mouth daily. 12/30/16   Meredeth Ide, MD  ?Interferon Beta-1a (REBIF REBIDOSE) 44 MCG/0.5ML SOAJ Inject 44 mcg into the skin 3 (three) times a week. 07/26/20   Glean Salvo, NP  ?phenytoin (DILANTIN) 100 MG ER capsule Take 3 capsules (300 mg total) by mouth daily. 03/17/21   Glean Salvo, NP  ?VITAMIN D PO Take 1 tablet by mouth daily.    [provider]  ? ? ?Family History ?Family History  ?Problem Relation Age of Onset  ? Colon cancer Mother   ? Heart attack Father   ? ? ?Social History ?Social History  ? ?Tobacco Use  ? Smoking status: Former  ? Smokeless tobacco: Never  ?Vaping Use  ? Vaping Use: Never used  ?Substance Use Topics  ? Alcohol use: No  ? Drug use: No  ? ? ? ?Allergies   ?Carbamazepine and Glatiramer acetate ? ? ?Review of Systems ?Review of Systems  ?All other systems reviewed and are negative. ? ? ?Physical Exam ?Triage Vital Signs ?ED Triage Vitals [05/30/21 1945]  ?Enc Vitals Group  ?  BP (!) 155/81  ?   Pulse Rate 90  ?   Resp 16  ?   Temp 98.6 ?F (37 ?C)  ?   Temp Source Oral  ?   SpO2 98 %  ?   Weight   ?   Height   ?   Head Circumference   ?   Peak Flow   ?   Pain Score   ?   Pain Loc   ?   Pain Edu?   ?   Excl. in GC?   ? ?No data found. ? ?Updated Vital Signs ?BP (!) 155/81 (BP Location: Left Arm)   Pulse 90   Temp 98.6 ?F (37 ?C) (Oral)   Resp 16   SpO2 98%  ? ?Visual Acuity ?Right Eye Distance:   ?Left Eye Distance:   ?Bilateral Distance:   ? ?Right Eye Near:   ?Left Eye Near:    ?Bilateral Near:    ? ?Physical Exam ?Vitals reviewed.  ?Constitutional:   ?   Appearance: Normal appearance.  ?Musculoskeletal:  ?   Comments: 58mm superfical laceration distal right index finger  nv and ns intact   ?Skin: ?   General: Skin is warm.  ?Neurological:  ?   General: No focal deficit  present.  ?   Mental Status: She is alert.  ?Psychiatric:     ?   Mood and Affect: Mood normal.  ? ? ? ?UC Treatments / Results  ?Labs ?(all labs ordered are listed, but only abnormal results are displayed) ?Labs Reviewed - No data to display ? ?EKG ? ? ?Radiology ?No results found. ? ?Procedures ?Procedures (including critical care time) ? ?Medications Ordered in UC ?Medications  ?Tdap (BOOSTRIX) injection 0.5 mL (has no administration in time range)  ? ? ?Initial Impression / Assessment and Plan / UC Course  ?I have reviewed the triage vital signs and the nursing notes. ? ?Pertinent labs & imaging results that were available during my care of the patient were reviewed by me and considered in my medical decision making (see chart for details). ? ?  ? ?(No hemostatic gauze or bandage available. Drop of dermabond to site.   ?Final Clinical Impressions(s) / UC Diagnoses  ? ?Final diagnoses:  ?Laceration of right index finger without foreign body without damage to nail, initial encounter  ? ? ? ?Discharge Instructions   ? ?  ?Return if any problems.  ? ? ?ED Prescriptions   ?None ?  ? ?PDMP not reviewed this encounter. ?An After Visit Summary was printed and given to the patient. ? ?  ?Elson Areas, [place] ?05/30/21 2001 ? ?

## 2021-05-30 NOTE — ED Triage Notes (Signed)
Pt cut her finger on a pill cutter today.  ?

## 2021-05-30 NOTE — Discharge Instructions (Signed)
Return if any problems.

## 2021-07-28 ENCOUNTER — Other Ambulatory Visit: Payer: Self-pay

## 2022-01-12 ENCOUNTER — Other Ambulatory Visit: Payer: Self-pay | Admitting: *Deleted

## 2022-01-12 DIAGNOSIS — G35 Multiple sclerosis: Secondary | ICD-10-CM

## 2022-01-12 MED ORDER — REBIF REBIDOSE 44 MCG/0.5ML ~~LOC~~ SOAJ: 44.0000 ug | SUBCUTANEOUS | 2 refills | Status: DC

## 2022-02-06 ENCOUNTER — Other Ambulatory Visit: Payer: Self-pay | Admitting: Internal Medicine

## 2022-02-06 DIAGNOSIS — Z1231 Encounter for screening mammogram for malignant neoplasm of breast: Secondary | ICD-10-CM

## 2022-02-21 ENCOUNTER — Encounter: Payer: Self-pay | Admitting: Neurology

## 2022-02-21 ENCOUNTER — Telehealth: Payer: Self-pay | Admitting: Neurology

## 2022-02-21 NOTE — Telephone Encounter (Signed)
Could not reach pt over the phone, invalid phone number. Sent letter in mail informing pt of need to reschedule 03/23/22 appointment - MD out

## 2022-03-23 ENCOUNTER — Ambulatory Visit: Payer: Medicare Other | Admitting: Neurology

## 2022-03-28 ENCOUNTER — Ambulatory Visit
Admission: RE | Admit: 2022-03-28 | Discharge: 2022-03-28 | Disposition: A | Discharge status: Home / Self Care | Payer: Medicare Other | Source: Ambulatory Visit | Attending: Internal Medicine | Admitting: Internal Medicine

## 2022-03-28 DIAGNOSIS — Z1231 Encounter for screening mammogram for malignant neoplasm of breast: Secondary | ICD-10-CM

## 2022-03-31 ENCOUNTER — Other Ambulatory Visit: Payer: Self-pay | Admitting: Internal Medicine

## 2022-03-31 DIAGNOSIS — R928 Other abnormal and inconclusive findings on diagnostic imaging of breast: Secondary | ICD-10-CM

## 2022-04-13 ENCOUNTER — Ambulatory Visit
Admission: RE | Admit: 2022-04-13 | Discharge: 2022-04-13 | Disposition: A | Discharge status: Home / Self Care | Payer: Medicare Other | Source: Ambulatory Visit | Attending: Internal Medicine | Admitting: Internal Medicine

## 2022-04-13 DIAGNOSIS — R928 Other abnormal and inconclusive findings on diagnostic imaging of breast: Secondary | ICD-10-CM

## 2022-05-15 ENCOUNTER — Other Ambulatory Visit: Payer: Self-pay

## 2022-05-15 DIAGNOSIS — G35 Multiple sclerosis: Secondary | ICD-10-CM

## 2022-05-15 MED ORDER — REBIF REBIDOSE 44 MCG/0.5ML ~~LOC~~ SOAJ: 44.0000 ug | SUBCUTANEOUS | 2 refills | Status: DC

## 2022-05-29 ENCOUNTER — Telehealth: Payer: Self-pay | Admitting: Neurology

## 2022-05-29 ENCOUNTER — Other Ambulatory Visit: Payer: Self-pay

## 2022-05-29 MED ORDER — PHENYTOIN SODIUM EXTENDED 100 MG PO CAPS: 300.0000 mg | ORAL_CAPSULE | Freq: Every day | ORAL | 0 refills | Status: DC

## 2022-05-29 NOTE — Telephone Encounter (Signed)
Pt stated insurance will pay for 10 extra pills a month. Stated she needs a new prescription for phenytoin (DILANTIN) 100 MG ER capsule. Prescription should be sent to [facility].

## 2022-05-30 NOTE — Telephone Encounter (Signed)
Called pt. Informed her of message nurse Katie sent. Pt said thanks for calling with the update.

## 2022-08-07 ENCOUNTER — Telehealth: Payer: Self-pay | Admitting: Neurology

## 2022-08-07 NOTE — Telephone Encounter (Signed)
Called and notified pt that sarah slack np doesn't feel like there is a medical reason to be excused from jury duty. Pt voiced understanding.

## 2022-08-07 NOTE — Telephone Encounter (Signed)
Pt asking if Maralyn Sago will write her a letter stating she can't do jury duty.

## 2022-08-07 NOTE — Telephone Encounter (Signed)
Patient overdue for revisit. Last seen in Feb 2023. At the time she was working as a Conservation officer, nature at [employer], was ambulatory. Seizures under excellent control. Kindly inform that I don't feel there is a medical reason to excuse her from jury duty. Thanks

## 2022-08-07 NOTE — Telephone Encounter (Signed)
Pt phone number invalid, LVM on pt's husband's phone informing pt of need to reschedule 08/09/22 appt - MD out  If pt calls back, you can use a New [place] slot to reschedule

## 2022-08-09 ENCOUNTER — Ambulatory Visit: Payer: Medicare Other | Admitting: Neurology

## 2022-08-14 ENCOUNTER — Other Ambulatory Visit: Payer: Self-pay | Admitting: Neurology

## 2022-08-30 NOTE — Progress Notes (Addendum)
Cardiology Office Note    Date:  09/01/2022  ID:  Sherri, Ramirez 09-Nov-1955, MRN [id number] PCP:  Andi Devon, MD  Cardiologist:  Meriam Sprague, MD  Electrophysiologist:  None   Chief Complaint: f/u atrial flutter  History of Present Illness: .    Sherri Ramirez is a 67 y.o. female with visit-pertinent history of multiple sclerosis ([facility]), remote seizures, remote tobacco abuse, isolated atrial flutter in the setting of pericarditis 2018, hypokalemia, anemia, trivial MR who presents for follow-up.   She was remotely admitted 2018 with chest pain, SOB, chills, and tachycardia. She was found to have atrial flutter RVR, treated with IV diltiazem + amiodarone and converted to NSR. She also reported chest pain with inspiration and was felt to have pericarditis and HCAP. She was treated with ibuprofen and colchicine. She was also treated with IV Lasix for small pleural effusions. She was started on aspirin only daily as her CHADSVASC was felt to be one for female only at that time. Dr. Delton See also felt she would be a difficult anticoagulation candidate given anemia, interaction with phenytoin, and fall risk. EF was normal. 30 day monitor in 2019 showed no afib/flutter. She went on to have coronary CTA 01/2017 with no evidence of CAD. There was aortic atherosclerosis mentioned in the overread but not in the coronary CTA itself. Last echo 03/2021 showed EF 55-60%, trivial MR.   She is seen for follow-up today overall feeling well. She denies any new cardiac symptoms.She's not had any unusual palpitations or stroke symptoms. She self discontinued ASA due to issue with bleeding when she cut her finger with a pill cutter.    Labwork independently reviewed: 03/2020 K 4.5, Cr 0.72, LFTs wnl, Hgb 12.0, plt 180 11/2016 TSH wnl, free T4 elev, free T3 decreased, LDL 109   ROS: .    Please see the history of present illness. Otherwise, review of systems is positive for .  All other  systems are reviewed and otherwise negative.  Studies Reviewed: Marland Kitchen    EKG:  EKG is ordered today, personally reviewed, demonstrating NSR 84bpm, nonspecific STTW changes. These STTW changes appear similar going back to 2018.  CV Studies: Cardiac studies reviewed are outlined and summarized above. Otherwise please see EMR for full report.  Physical Exam:    VS:  BP 110/72   Pulse 84   Ht 5\' 3"  (1.6 m)   Wt 104 lb 12.8 oz (47.5 kg)   SpO2 99%   BMI 18.56 kg/m    Wt Readings from Last 3 Encounters:  09/01/22 104 lb 12.8 oz (47.5 kg)  03/17/21 103 lb (46.7 kg)  02/09/21 104 lb 9.6 oz (47.4 kg)    GEN: Well nourished, well developed in no acute distress NECK: No JVD; No carotid bruits CARDIAC: RRR, no murmurs, rubs, gallops RESPIRATORY:  Clear to auscultation without rales, wheezing or rhonchi  ABDOMEN: Soft, non-tender, non-distended EXTREMITIES:  No edema; No acute deformity   Asessement and Plan:.    1. Paroxysmal atrial flutter - very brief occurrence in 2018 in setting of HCAP and pericarditis, converted to NSR at that time. Since 2018 she has remained in NSR with normal event monitor in 2019. She reported previously that her atrial flutter felt very obvious when it happened. She has not had any symptoms to suggest recurrence. Dr. Delton See previously felt it was OK to remain off anticoagulation given no recurrence. Her CHADSVASC is now 76 for age, female, aortic atherosclerosis which  would warrant conversation around anticoagulation if she were to have recurrence. This would likely involve conversation with pharmacist given her phenytoin use and potential drug interactions. We discussed that ASA itself would not be sufficient if she were to have a recurrent arrhythmia. She self-discontinued ASA due to bleeding issues when she had a cut and otherwise does not have a compelling reason to be on this. We discussed using a FitBit or Apple Watch to monitor her heart rhythm in real time. She will  notify if any interim issues arise. We'll update CBC, BMET, TSH today to ensure basic lab parameters are optimized.  2. H/o pericarditis - quiescent without recurrence.  3. Aortic atherosclerosis - lipids historically managed by PCP per her request in prior OVs. She has already eaten today therefore plans to f/u this fall with PCP for her physical at which time she will request cholesterol check.  4. Mitral regurgitation - trivial by last echo, no significant progression from 2018. No further follow-up needed unless she develops recurrent cardiac symptoms or new murmur.     Disposition: Upon Dr. Devin Going relocation, transition to Dr. Izora Ribas in 1 year to establish care.  Signed, Laurann Montana, PA-C

## 2022-09-01 ENCOUNTER — Encounter: Payer: Self-pay | Admitting: Physician Assistant

## 2022-09-01 ENCOUNTER — Ambulatory Visit: Payer: Medicare Other | Attending: Physician Assistant | Admitting: Physician Assistant

## 2022-09-01 VITALS — BP 110/72 | HR 84 | Ht 63.0 in | Wt 104.8 lb

## 2022-09-01 DIAGNOSIS — I340 Nonrheumatic mitral (valve) insufficiency: Secondary | ICD-10-CM | POA: Diagnosis not present

## 2022-09-01 DIAGNOSIS — I4892 Unspecified atrial flutter: Secondary | ICD-10-CM | POA: Diagnosis not present

## 2022-09-01 DIAGNOSIS — I700 Atherosclerosis of aorta: Secondary | ICD-10-CM | POA: Diagnosis not present

## 2022-09-01 DIAGNOSIS — Z8679 Personal history of other diseases of the circulatory system: Secondary | ICD-10-CM

## 2022-09-01 NOTE — Patient Instructions (Addendum)
Medication Instructions:  Your physician recommends that you continue on your current medications as directed. Please refer to the Current Medication list given to you today.  *If you need a refill on your cardiac medications before your next appointment, please call your pharmacy*   Lab Work: BMET, CBC, TSH If you have labs (blood work) drawn today and your tests are completely normal, you will receive your results only by: MyChart Message (if you have MyChart) OR A paper copy in the mail If you have any lab test that is abnormal or we need to change your treatment, we will call you to review the results.  Follow-Up: At [facility], you and your health needs are our priority.  As part of our continuing mission to provide you with exceptional heart care, we have created designated Provider Care Teams.  These Care Teams include your primary Cardiologist (physician) and Advanced Practice Providers (APPs -  Physician Assistants and Nurse Practitioners) who all work together to provide you with the care you need, when you need it.   Your next appointment:   1 year(s)  Provider:   Dr Izora Ribas   Other Instructions  I would recommend you get a FitBit or Apple Watch, something that has the capability to notify you if your heart rate goes elevated or out of rhythm.

## 2022-09-05 ENCOUNTER — Telehealth: Payer: Self-pay | Admitting: Neurology

## 2022-09-05 ENCOUNTER — Ambulatory Visit: Payer: Medicare Other | Admitting: Neurology

## 2022-09-05 ENCOUNTER — Encounter: Payer: Self-pay | Admitting: Neurology

## 2022-09-05 VITALS — BP 137/71 | HR 91 | Ht 63.0 in | Wt 105.0 lb

## 2022-09-05 DIAGNOSIS — Z79899 Other long term (current) drug therapy: Secondary | ICD-10-CM | POA: Diagnosis not present

## 2022-09-05 DIAGNOSIS — G35 Multiple sclerosis: Secondary | ICD-10-CM | POA: Diagnosis not present

## 2022-09-05 DIAGNOSIS — G40209 Localization-related (focal) (partial) symptomatic epilepsy and epileptic syndromes with complex partial seizures, not intractable, without status epilepticus: Secondary | ICD-10-CM

## 2022-09-05 NOTE — Telephone Encounter (Signed)
[facility] does not take medicare for MRI [facility] medicare NPR sent to GI [phone]

## 2022-09-05 NOTE — Progress Notes (Addendum)
[facility]  PATIENT: Sherri Ramirez DOB: Sep 28, 1955  REFERRING DOCTOR OR PCP: Andi Devon, MD SOURCE: Patient, note from Dr. Anne Hahn and Margie Ege, FNP, imaging and lab reports,  _________________________________   HISTORICAL  CHIEF COMPLAINT:  Chief Complaint  Patient presents with   Follow-up    Pt in room 11. Here for MS and seizures. Pt reports no recent seizures, MS is doing good. No concerns.      HISTORY OF PRESENT ILLNESS:  I had the pleasure seeing your patient, Sherri Ramirez, at [facility] for neurologic consultation regarding her multiple sclerosis and history of seizures.   She was first diagnosed with MS in 1992 after presenting with diplopia. This lasted one month.  At some point in the 1990's she noted right leg weakness.Marland Kitchen   She was placed on Dilantin and had no other episodes.     She was initially on Copaxone but had allergic reaction.  She then was switched to Betaseron and was on that between 2005 and 2015 when she was switched to Rebif for insurance purposes.    She ws initially seeing Dr. Buzzy Han, then Dr. Orlin Hilding and then saw Dr. Anne Hahn.    She has never had a relapse since.  She reports having 3 LP's and being told the CSF was normal on one.  She had a couple spells of right arm posturing not associated with LOC.  She was told these were seizures and started Dilantin.   No more episodes since the late 1990's.     She has had a limp several years.  She could walk a couple miles but feels gait is not as good as it was a few years ago.  She holds the bannister on stairs x many years.   She has no recent falls.    She has mild weakness in her right leg x many years.  Arms are strong .  No numbness.  Bladder function is fine.   Vision is good with glasses.  She notes occasional forgetfulness but always recall with hints.    She sometimes notes mild word finding difficulty.  Otherwise cogntion is fine.  Mood is  doing well    Imaging: MRI report from 03/28/2002 showed "several white matter type changes suspicious for demyelinating process such as MS".  The actual images were not available.   REVIEW OF SYSTEMS: Constitutional: No fevers, chills, sweats, or change in appetite Eyes: No visual changes, double vision, eye pain Ear, nose and throat: No hearing loss, ear pain, nasal congestion, sore throat Cardiovascular: No chest pain, palpitations Respiratory:  No shortness of breath at rest or with exertion.   No wheezes GastrointestinaI: No nausea, vomiting, diarrhea, abdominal pain, fecal incontinence Genitourinary:  No dysuria, urinary retention or frequency.  No nocturia. Musculoskeletal:  No neck pain, back pain Integumentary: No rash, pruritus, skin lesions Neurological: as above Psychiatric: No depression at this time.  No anxiety Endocrine: No palpitations, diaphoresis, change in appetite, change in weigh or increased thirst Hematologic/Lymphatic:  No anemia, purpura, petechiae. Allergic/Immunologic: No itchy/runny eyes, nasal congestion, recent allergic reactions, rashes  ALLERGIES: Allergies  Allergen Reactions   Carbamazepine    Glatiramer Acetate     REACTION: anaphylaxis    HOME MEDICATIONS:  Current Outpatient Medications:    albuterol (VENTOLIN HFA) 108 (90 Base) MCG/ACT inhaler, Proventil HFA 90 mcg/actuation aerosol inhaler  Inhale 2 puffs every 4 hours by inhalation route., Disp: , Rfl:    Interferon Beta-1a (REBIF REBIDOSE) 44  MCG/0.5ML SOAJ, Inject 44 mcg into the skin 3 (three) times a week., Disp: 6 mL, Rfl: 2   phenytoin (DILANTIN) 100 MG ER capsule, TAKE 3 CAPSULES BY MOUTH DAILY, Disp: 270 capsule, Rfl: 3   VITAMIN D PO, Take 1 tablet by mouth daily., Disp: , Rfl:   PAST MEDICAL HISTORY: Past Medical History:  Diagnosis Date   Anemia    a. drop from from 12->8.6 in 11/2016.   Atrial flutter, paroxysmal (HCC)    a. dx 11/2016.   Former tobacco use    Hypokalemia    MS  (multiple sclerosis) (HCC)    Pericarditis    a. dx 11/2016.   Seizure (HCC)    Volume overload    a. in setting of atrial flutter 11/2016. Echo actually showed normal EF and normal diastolic function so was likely due to atrial flutter.    PAST SURGICAL HISTORY: Past Surgical History:  Procedure Laterality Date   ABDOMINAL HYSTERECTOMY     BREAST EXCISIONAL BIOPSY Left pt unsure   benign   HYSTEROTOMY      FAMILY HISTORY: Family History  Problem Relation Age of Onset   Colon cancer Mother    Heart attack Father     SOCIAL HISTORY: Social History   Socioeconomic History   Marital status: Married    Spouse name: [name]   Number of children: 0   Years of education: Not on file   Highest education level: Not on file  Occupational History   Not on file  Tobacco Use   Smoking status: Former   Smokeless tobacco: Never  Vaping Use   Vaping status: Never Used  Substance and Sexual Activity   Alcohol use: No   Drug use: No   Sexual activity: Not on file  Other Topics Concern   Not on file  Social History Narrative   Patient is married and does not work.    Patient quit smoking in 2007.   Social Determinants of Health   Financial Resource Strain: Not on file  Food Insecurity: Not on file  Transportation Needs: Not on file  Physical Activity: Not on file  Stress: Not on file  Social Connections: Not on file  Intimate Partner Violence: Not on file       PHYSICAL EXAM  Vitals:   09/05/22 1308  BP: 137/71  Pulse: 91  Weight: 105 lb (47.6 kg)  Height: 5\' 3"  (1.6 m)    Body mass index is 18.6 kg/m.   General: The patient is well-developed and well-nourished and in no acute distress  HEENT:  Head is [place]/AT.  Sclera are anicteric.  Funduscopic exam shows normal optic discs and retinal vessels.  Neck: No carotid bruits are noted.  The neck is nontender.  Cardiovascular: The heart has a regular rate and rhythm with a normal S1 and S2. There were no  murmurs, gallops or rubs.    Skin: Extremities are without rash or  edema.  Musculoskeletal:  Back is nontender  Neurologic Exam  Mental status: The patient is alert and oriented x 3 at the time of the examination. The patient has apparent normal recent and remote memory, with an apparently normal attention span and concentration ability.   Speech is normal.  Cranial nerves: Extraocular movements are full. Pupils are equal, round, and reactive to light and accomodation.  Visual fields are full.  Facial symmetry is present. There is good facial sensation to soft touch bilaterally.Facial strength is normal.  Trapezius and sternocleidomastoid strength  is normal. No dysarthria is noted.  The tongue is midline, and the patient has symmetric elevation of the soft palate. No obvious hearing deficits are noted.  Motor:  Muscle bulk is normal.   Tone is normal. Strength is  4-/5 in right proximal and 4/5 in distal leg and 5 / 5 in other 3 extremities.   Sensory: Sensory testing is intact to pinprick, soft touch and vibration sensation in all 4 extremities.  Coordination: Cerebellar testing reveals good finger-nose-finger and heel-to-shin bilaterally.  Gait and station: Station is normal.   Mild right foot drop and wide gait.  Cannot tandem.  . Romberg is negative.   Reflexes: Deep tendon reflexes are symmetric and normal bilaterally.   Plantar responses are flexor.    DIAGNOSTIC DATA (LABS, IMAGING, TESTING) - I reviewed patient records, labs, notes, testing and imaging myself where available.  Lab Results  Component Value Date   WBC 5.3 09/01/2022   HGB 10.8 (L) 09/01/2022   HCT 33.9 (L) 09/01/2022   MCV 80 09/01/2022   PLT 203 09/01/2022      Component Value Date/Time   NA 140 09/01/2022 1129   K 3.6 09/01/2022 1129   CL 104 09/01/2022 1129   CO2 25 09/01/2022 1129   GLUCOSE 98 09/01/2022 1129   GLUCOSE 93 12/29/2016 0423   BUN 16 09/01/2022 1129   CREATININE 0.86 09/01/2022 1129    CALCIUM 9.1 09/01/2022 1129   PROT 7.1 03/18/2020 1334   ALBUMIN 4.1 03/18/2020 1334   AST 17 03/18/2020 1334   ALT 10 03/18/2020 1334   ALKPHOS 88 03/18/2020 1334   BILITOT 0.3 03/18/2020 1334   GFRNONAA 89 03/18/2020 1334   GFRAA 103 03/18/2020 1334   Lab Results  Component Value Date   CHOL 165 12/26/2016   HDL 43 12/26/2016   LDLCALC 109 (H) 12/26/2016   TRIG 67 12/26/2016   CHOLHDL 3.8 12/26/2016   Lab Results  Component Value Date   HGBA1C 5.0 12/26/2016   Lab Results  Component Value Date   VITAMINB12 388 12/26/2016   Lab Results  Component Value Date   TSH 0.837 09/01/2022       ASSESSMENT AND PLAN  Multiple sclerosis (HCC) - Plan: MR BRAIN W WO CONTRAST, MR CERVICAL SPINE WO CONTRAST, Comprehensive metabolic panel, CBC with Differential/Platelet  Partial symptomatic epilepsy with complex partial seizures, not intractable, without status epilepticus (HCC) - Plan: MR BRAIN W WO CONTRAST, EEG adult, Phenytoin Level, Total, Comprehensive metabolic panel, CBC with Differential/Platelet  High risk medication use - Plan: Comprehensive metabolic panel, CBC with Differential/Platelet   Sherri Ramirez was diagnosed with MS in 1992 and has been on interferons for many years.  She denies having any exacerbations since the 1990s.  Fortunately, her last brain MRI was from 2004 and the images are not available.  She most likely has fairly mild MS.  Because she is older than 70 and has had no recent exacerbations, she might be able to get off of the disease modifying therapy.  I do want to check an MRI of the brain and cervical spine to help determine the aggressiveness.  If there are any new appearing lesions, I will probably keep her on the medication.  Otherwise we will consider stopping.  For the time being she will remain on Rebif.  We will check some blood work. She has been on Dilantin since 1999 due to a suspected seizure.  It is possible that the event she described was  due  to a phasic spasm, especially if she has spinal cord plaque.  I would consider stopping the Dilantin but would need to check the MRI of the brain and EEG before hand. Based on the results, changes in medication or additional studies may be ordered.  She will return in 1 year or sooner if there are new or worsening neurologic symptoms.  45-minute office visit with the majority of the time spent face-to-face for history and physical, discussion/counseling and decision-making.  Additional time with record review and documentation.  This visit is part of a comprehensive longitudinal care medical relationship regarding the patients primary diagnosis of MS and related concerns.    Shneur Whittenburg A. Epimenio Foot, MD, [facility] 09/05/2022, 1:36 PM Certified in Neurology, Clinical Neurophysiology, Sleep Medicine and Neuroimaging  [facility] [address] [place], [place] [address] [phone]

## 2022-09-13 ENCOUNTER — Ambulatory Visit (INDEPENDENT_AMBULATORY_CARE_PROVIDER_SITE_OTHER): Payer: Medicare Other | Admitting: Neurology

## 2022-09-13 DIAGNOSIS — G40209 Localization-related (focal) (partial) symptomatic epilepsy and epileptic syndromes with complex partial seizures, not intractable, without status epilepticus: Secondary | ICD-10-CM

## 2022-09-13 NOTE — Progress Notes (Signed)
   [facility]  EEG (ELECTROENCEPHALOGRAM) REPORT   STUDY DATE: 09/13/2022 PATIENT NAME: Sherri Ramirez DOB: [birth year] MRN: [id number]  ORDERING CLINICIAN: Akeela Busk A. Epimenio Foot, MD, PhD  TECHNOLOGIST: Marianne Sofia, REEGT  TECHNIQUE: Electroencephalogram was recorded utilizing standard 10-20 system of lead placement and reformatted into average and bipolar montages.  RECORDING TIME: 26 minutes 25 seconds  CLINICAL INFORMATION: 67 year old woman with multiple sclerosis who has had spells of right arm posturing  FINDINGS: A digital EEG was performed while the patient was awake and drowsy. While awake and most alert there was a 9-10 hz posterior dominant rhythm. Voltages and frequencies were symmetric.  There were no focal, lateralizing, epileptiform activity or seizures seen.  Photic stimulation had a normal driving response. Hyperventilation and recovery was normal.. EKG channel shows normal sinus rhythm.  The patient briefly became drowsy but did not fall asleep.Marland Kitchen  IMPRESSION: This is a normal EEG while the patient was awake.   INTERPRETING PHYSICIAN:   Jahmya Onofrio A. Epimenio Foot, MD, PhD, [facility] Certified in Neurology, Clinical Neurophysiology, Sleep Medicine, Pain Medicine and Neuroimaging  [facility] [address] [place], [place] [address] [phone]

## 2022-09-14 ENCOUNTER — Telehealth: Payer: Self-pay

## 2022-09-14 NOTE — Telephone Encounter (Signed)
-----   Message from Asa Lente sent at 09/13/2022  7:00 PM EDT ----- Regarding: EEG Please let the patient know that the EEG was normal.  There was no seizure activity.  At the last visit, we discussed that we could stop the Dilantin if the EEG did not show any seizure activity.  If she would like, she could stop the Dilantin to see how she does.

## 2022-09-14 NOTE — Telephone Encounter (Signed)
Called patient and line was busy and I was unable to LVM

## 2022-09-20 ENCOUNTER — Telehealth: Payer: Self-pay

## 2022-09-20 NOTE — Telephone Encounter (Signed)
-----   Message from Asa Lente sent at 09/13/2022  7:00 PM EDT ----- Regarding: EEG Please let the patient know that the EEG was normal.  There was no seizure activity.  At the last visit, we discussed that we could stop the Dilantin if the EEG did not show any seizure activity.  If she would like, she could stop the Dilantin to see how she does.

## 2022-09-20 NOTE — Telephone Encounter (Signed)
Call to patient, reviewed EEG results. She stated she would like to stay on dilantin until next appointment and then possibly taper off. Advised her next appointment is with Margie Ege, NP and I would make her aware. Patient appreciative of call.

## 2022-10-10 ENCOUNTER — Other Ambulatory Visit: Payer: Self-pay

## 2022-10-10 DIAGNOSIS — G35 Multiple sclerosis: Secondary | ICD-10-CM

## 2022-10-10 MED ORDER — REBIF REBIDOSE 44 MCG/0.5ML ~~LOC~~ SOAJ: 44.0000 ug | SUBCUTANEOUS | 2 refills | Status: DC

## 2022-10-19 ENCOUNTER — Ambulatory Visit
Admission: RE | Admit: 2022-10-19 | Discharge: 2022-10-19 | Disposition: A | Discharge status: Home / Self Care | Payer: Medicare Other | Source: Ambulatory Visit | Attending: Neurology | Admitting: Neurology

## 2022-10-19 DIAGNOSIS — G35 Multiple sclerosis: Secondary | ICD-10-CM

## 2022-10-19 DIAGNOSIS — G40209 Localization-related (focal) (partial) symptomatic epilepsy and epileptic syndromes with complex partial seizures, not intractable, without status epilepticus: Secondary | ICD-10-CM

## 2022-10-19 MED ORDER — GADOPICLENOL 0.5 MMOL/ML IV SOLN
5.0000 mL | Freq: Once | INTRAVENOUS | Status: AC | PRN
  Administered 2022-10-19: 5 mL via INTRAVENOUS

## 2022-10-23 ENCOUNTER — Telehealth: Payer: Self-pay | Admitting: Neurology

## 2022-10-23 NOTE — Telephone Encounter (Signed)
I discussed the recent MRI of the brain and cervical spine results.  The MRI of the cervical spine shows a definite T2 hyperintense focus laterally to the right adjacent to C3-C4, about 16 mm in length.  It is likely the primary source of her right-sided symptoms.  This lesion could also explain the spells of phasic spasms she was experiencing more than 20 years ago that I believe were mistakenly felt to be seizure.  She does not have a brain lesion that would better explain the symptoms.  I advised her to stop the Dilantin.  However, if the spells return we would need to consider restarting..  There also appears to be 2 smaller lesions, laterally to the right adjacent to C5-C6 and medially to the left adjacent to C3-C4.  These are much smaller and could be asymptomatic.  The second issue we discussed is aware that she needs to stay on a disease modifying therapy (currently on Rebif).  I discussed that many patients who have had MS for more than 25 years and are in the 63s with no recent exacerbations are good candidates to consider withdrawing the disease modifying therapy.  We had discussed this at the last visit.  She would like to stay on Rebif for a while longer.  I recommend that in about 2 years (sometime in 2026) that we consider rechecking the MRI of the brain and if it continues to be stable we could reconsider discontinuing the DMT.  She comes back to see our office next year

## 2022-11-15 ENCOUNTER — Encounter: Payer: Self-pay | Admitting: Family Medicine

## 2022-11-15 ENCOUNTER — Ambulatory Visit: Payer: Medicare Other | Admitting: Family Medicine

## 2022-11-15 VITALS — BP 120/70 | HR 87 | Temp 98.0°F | Ht 63.0 in | Wt 104.4 lb

## 2022-11-15 DIAGNOSIS — Z2821 Immunization not carried out because of patient refusal: Secondary | ICD-10-CM

## 2022-11-15 DIAGNOSIS — Z7689 Persons encountering health services in other specified circumstances: Secondary | ICD-10-CM

## 2022-11-15 DIAGNOSIS — M545 Low back pain, unspecified: Secondary | ICD-10-CM

## 2022-11-15 DIAGNOSIS — I5032 Chronic diastolic (congestive) heart failure: Secondary | ICD-10-CM

## 2022-11-15 DIAGNOSIS — G35 Multiple sclerosis: Secondary | ICD-10-CM

## 2022-11-15 MED ORDER — TRAMADOL-ACETAMINOPHEN 37.5-325 MG PO TABS: 1.0000 | ORAL_TABLET | Freq: Four times a day (QID) | ORAL | 0 refills | Status: AC | PRN

## 2022-11-15 NOTE — Progress Notes (Signed)
I,Jameka J Llittleton, CMA,acting as a Neurosurgeon for [employer], NP.,have documented all relevant documentation on the behalf of Ellender Hose, NP,as directed by  Ellender Hose, NP while in the presence of Ellender Hose, NP.  Subjective:  Patient ID: Sherri Ramirez , female    DOB: 03-01-1955 , 67 y.o.   MRN: [id number]  Chief Complaint  Patient presents with   Establish Care   Back Pain    HPI  Patient is a 67 year old female who presents today to establish primary care. She would also like to be seen for her back. Patient states she went to [facility] on Monday and had to stand on concrete for a long time which has caused her back to hurt. Patient describes the pain in her back as throbbing and achy, which she rates as 7/10.  Patient has Multiple Sclerosis and is treated at [facility]) by Dr Leilani Merl.      Past Medical History:  Diagnosis Date   Anemia    a. drop from from 12->8.6 in 11/2016.   Atrial flutter, paroxysmal (HCC)    a. dx 11/2016.   Former tobacco use    Hypokalemia    MS (multiple sclerosis) (HCC)    Pericarditis    a. dx 11/2016.   Seizure (HCC)    Volume overload    a. in setting of atrial flutter 11/2016. Echo actually showed normal EF and normal diastolic function so was likely due to atrial flutter.     Family History  Problem Relation Age of Onset   Colon cancer Mother    Heart attack Father      Current Outpatient Medications:    albuterol (VENTOLIN HFA) 108 (90 Base) MCG/ACT inhaler, Proventil HFA 90 mcg/actuation aerosol inhaler  Inhale 2 puffs every 4 hours by inhalation route., Disp: , Rfl:    Interferon Beta-1a (REBIF REBIDOSE) 44 MCG/0.5ML SOAJ, Inject 44 mcg into the skin 3 (three) times a week., Disp: 6 mL, Rfl: 2   VITAMIN D PO, Take 1 tablet by mouth daily., Disp: , Rfl:    phenytoin (DILANTIN) 100 MG ER capsule, TAKE 3 CAPSULES BY MOUTH DAILY (Patient not taking: Reported on 11/15/2022), Disp: 270 capsule, Rfl: 3   Allergies   Allergen Reactions   Carbamazepine    Glatiramer Acetate     REACTION: anaphylaxis     Review of Systems  Constitutional: Negative.   HENT: Negative.    Eyes: Negative.   Respiratory: Negative.    Cardiovascular: Negative.   Genitourinary: Negative.   Musculoskeletal:  Positive for back pain and gait problem.  Skin: Negative.      Today's Vitals   11/15/22 1536  BP: 120/70  Pulse: 87  Temp: 98 F (36.7 C)  SpO2: 97%  Weight: 104 lb 6.4 oz (47.4 kg)  Height: 5\' 3"  (1.6 m)  PainSc: 7   PainLoc: Back   Body mass index is 18.49 kg/m.  Wt Readings from Last 3 Encounters:  11/15/22 104 lb 6.4 oz (47.4 kg)  09/05/22 105 lb (47.6 kg)  09/01/22 104 lb 12.8 oz (47.5 kg)    The ASCVD Risk score (Arnett DK, et al., 2019) failed to calculate for the following reasons:   Cannot find a previous HDL lab   Cannot find a previous total cholesterol lab  Objective:  Physical Exam Constitutional:      Appearance: Normal appearance.  HENT:     Head: Normocephalic.  Cardiovascular:     Rate and  Rhythm: Normal rate.     Pulses: Normal pulses.     Heart sounds: Normal heart sounds.  Pulmonary:     Effort: Pulmonary effort is normal.     Breath sounds: Normal breath sounds.  Abdominal:     General: Bowel sounds are normal.  Musculoskeletal:        General: Tenderness present.  Neurological:     Mental Status: She is alert.         Assessment And Plan:  Establishing care with new doctor, encounter for  Multiple sclerosis [facility]) Assessment & Plan: Chronic. Followed by [facility] Treated with REBIF SQ 3 X Weekly   Chronic diastolic congestive heart failure [facility]) Assessment & Plan: Last Echo 03/2021 EF 55-60 %; Cardiologist Dr Jon Billings, [facility].   Lumbar spine pain -     traMADol-Acetaminophen; Take 1 tablet by mouth every 6 (six) hours as needed for up to 3 days for severe pain (pain score 7-10).  Dispense: 15 tablet; Refill: 0  Herpes zoster vaccination  declined  Pneumococcal vaccination declined    Return in 8 weeks (on 01/10/2023), or if symptoms worsen or fail to improve, for AWV.  Patient was given opportunity to ask questions. Patient verbalized understanding of the plan and was able to repeat key elements of the plan. All questions were answered to their satisfaction.    I, Ellender Hose, NP, have reviewed all documentation for this visit. The documentation on 11/19/22 for the exam, diagnosis, procedures, and orders are all accurate and complete.    IF YOU HAVE BEEN REFERRED TO A SPECIALIST, IT MAY TAKE 1-2 WEEKS TO SCHEDULE/PROCESS THE REFERRAL. IF YOU HAVE NOT HEARD FROM US/SPECIALIST IN TWO WEEKS, PLEASE GIVE [place] A CALL AT [phone].

## 2022-11-21 DIAGNOSIS — I5032 Chronic diastolic (congestive) heart failure: Secondary | ICD-10-CM | POA: Insufficient documentation

## 2022-11-21 DIAGNOSIS — M545 Low back pain, unspecified: Secondary | ICD-10-CM | POA: Insufficient documentation

## 2022-11-21 NOTE — Assessment & Plan Note (Signed)
Chronic. Followed by [facility] Treated with REBIF SQ 3 X Weekly

## 2022-11-21 NOTE — Assessment & Plan Note (Signed)
Last Echo 03/2021 EF 55-60 %; Cardiologist Dr Jon Billings, [facility].

## 2023-01-10 ENCOUNTER — Ambulatory Visit: Payer: Medicare Other | Admitting: Family Medicine

## 2023-01-10 ENCOUNTER — Encounter: Payer: Self-pay | Admitting: Family Medicine

## 2023-01-10 VITALS — BP 106/70 | HR 94 | Temp 98.1°F | Ht 65.0 in | Wt 105.0 lb

## 2023-01-10 DIAGNOSIS — R059 Cough, unspecified: Secondary | ICD-10-CM

## 2023-01-10 DIAGNOSIS — G35 Multiple sclerosis: Secondary | ICD-10-CM | POA: Diagnosis not present

## 2023-01-10 DIAGNOSIS — Z0000 Encounter for general adult medical examination without abnormal findings: Secondary | ICD-10-CM

## 2023-01-10 DIAGNOSIS — N951 Menopausal and female climacteric states: Secondary | ICD-10-CM

## 2023-01-10 DIAGNOSIS — I700 Atherosclerosis of aorta: Secondary | ICD-10-CM

## 2023-01-10 DIAGNOSIS — I5032 Chronic diastolic (congestive) heart failure: Secondary | ICD-10-CM

## 2023-01-10 DIAGNOSIS — Z1159 Encounter for screening for other viral diseases: Secondary | ICD-10-CM

## 2023-01-10 DIAGNOSIS — I4892 Unspecified atrial flutter: Secondary | ICD-10-CM

## 2023-01-10 MED ORDER — BENZONATATE 100 MG PO CAPS: 100.0000 mg | ORAL_CAPSULE | Freq: Three times a day (TID) | ORAL | 0 refills | Status: DC | PRN

## 2023-01-10 NOTE — Progress Notes (Signed)
Subjective:   Sherri Ramirez is a 67 y.o. female who presents for Medicare Annual (Subsequent) preventive examination.  Visit Complete: In person  Patient Medicare AWV questionnaire was completed by the patient on 01/10/2023. I have confirmed that all information answered by patient is correct and no changes . She takes Rebidose 44 mcg SQ three times  weekly for Multiple Sclerosis, she is  followed by [facility]) is followed by Cardiology, at [facility] care for Paroxymal Atrial Flutter and Aortic atherosclerosis. Last echo 03/2021 showed EF 55-60% , trivial mitral regurgitation, patient states that she was on ASA 81 mg but she self discontinued because of bleeding issues after cutting her finger. She denies any palpitations, chest pain or bleeding issues today. Last visit to the cardiologist was 08/2022.        Objective:    Today's Vitals   01/10/23 1003  BP: 106/70  Pulse: 94  Temp: 98.1 F (36.7 C)  Weight: 105 lb (47.6 kg)  Height: 5\' 5"  (1.651 m)  PainSc: 0-No pain   Body mass index is 17.47 kg/m.     12/26/2016   12:00 AM 12/25/2016    7:17 PM 12/25/2016    5:30 PM  Advanced Directives  Does Patient Have a Medical Advance Directive? No No No  Would patient like information on creating a medical advance directive? No - Patient declined No - Patient declined     Current Medications (verified) Outpatient Encounter Medications as of 01/10/2023  Medication Sig   albuterol (VENTOLIN HFA) 108 (90 Base) MCG/ACT inhaler Proventil HFA 90 mcg/actuation aerosol inhaler  Inhale 2 puffs every 4 hours by inhalation route.   benzonatate (TESSALON PERLES) 100 MG capsule Take 1 capsule (100 mg total) by mouth 3 (three) times daily as needed for cough.   VITAMIN D PO Take 1 tablet by mouth daily.   [DISCONTINUED] Interferon Beta-1a (REBIF REBIDOSE) 44 MCG/0.5ML SOAJ Inject 44 mcg into the skin 3 (three) times a week.   [DISCONTINUED] phenytoin (DILANTIN)  100 MG ER capsule TAKE 3 CAPSULES BY MOUTH DAILY (Patient not taking: Reported on 01/10/2023)   No facility-administered encounter medications on file as of 01/10/2023.    Allergies (verified) Carbamazepine and Glatiramer acetate   History: Past Medical History:  Diagnosis Date   Anemia    a. drop from from 12->8.6 in 11/2016.   Atrial flutter, paroxysmal (HCC)    a. dx 11/2016.   Former tobacco use    Hypokalemia    MS (multiple sclerosis) (HCC)    Pericarditis    a. dx 11/2016.   Seizure (HCC)    Volume overload    a. in setting of atrial flutter 11/2016. Echo actually showed normal EF and normal diastolic function so was likely due to atrial flutter.   Past Surgical History:  Procedure Laterality Date   ABDOMINAL HYSTERECTOMY     BREAST EXCISIONAL BIOPSY Left pt unsure   benign   HYSTEROTOMY     Family History  Problem Relation Age of Onset   Colon cancer Mother    Heart attack Father    Social History   Socioeconomic History   Marital status: Married    Spouse name: [name]   Number of children: 0   Years of education: Not on file   Highest education level: Not on file  Occupational History   Not on file  Tobacco Use   Smoking status: Former    Passive exposure: Never   Smokeless tobacco:  Never  Vaping Use   Vaping status: Never Used  Substance and Sexual Activity   Alcohol use: No   Drug use: No   Sexual activity: Not on file  Other Topics Concern   Not on file  Social History Narrative   Patient is married and does not work.    Patient quit smoking in 2007.   Social Drivers of Corporate investment banker Strain: Low Risk  (01/10/2023)   Overall Financial Resource Strain (CARDIA)    Difficulty of Paying Living Expenses: Not hard at all  Food Insecurity: No Food Insecurity (01/10/2023)   Hunger Vital Sign    Worried About Running Out of Food in the Last Year: Never true    Ran Out of Food in the Last Year: Never true  Transportation Needs: No  Transportation Needs (01/10/2023)   PRAPARE - Administrator, Civil Service (Medical): No    Lack of Transportation (Non-Medical): No  Physical Activity: Inactive (01/10/2023)   Exercise Vital Sign    Days of Exercise per Week: 0 days    Minutes of Exercise per Session: 0 min  Stress: No Stress Concern Present (01/10/2023)   [employer] of Occupational Health - Occupational Stress Questionnaire    Feeling of Stress : Not at all  Social Connections: Moderately Integrated (01/10/2023)   Social Connection and Isolation Panel [NHANES]    Frequency of Communication with Friends and Family: Once a week    Frequency of Social Gatherings with Friends and Family: More than three times a week    Attends Religious Services: More than 4 times per year    Active Member of [employer] or Organizations: No    Attends Banker Meetings: Never    Marital Status: Married    Tobacco Counseling Counseling given: Not Answered   Clinical Intake:     Pain Score: 0-No pain                  Activities of Daily Living    01/10/2023   10:04 AM  In your present state of health, do you have any difficulty performing the following activities:  Hearing? 0  Vision? 0  Difficulty concentrating or making decisions? 0  Walking or climbing stairs? 1  Dressing or bathing? 0  Doing errands, shopping? 0    Patient Care Team: Ellender Hose, NP as PCP - General (Family Medicine) Christell Constant, MD as PCP - Cardiology (Cardiology)  Indicate any recent Medical Services you may have received from other than [facility] providers in the past year (date may be approximate).     Assessment:   This is a routine wellness examination for [name]. She is a pleasant 67 year old female, alert,oriented to self,place,time and situation. She denies any chest pain, palpitation or headaches.She denies any pain at this time. Hearing/Vision screen Hearing Screening   500Hz  1000Hz  2000Hz   4000Hz   Right ear 40 20 20 40  Left ear Fail 40 25 20   Vision Screening   Right eye Left eye Both eyes  Without correction     With correction 20/30 20/20 20/20      Goals Addressed   None   Depression Screen    01/10/2023   10:09 AM 11/15/2022    3:40 PM  PHQ 2/9 Scores  PHQ - 2 Score 0 0  PHQ- 9 Score 0 6    Fall Risk    11/15/2022    3:40 PM  Fall Risk  Falls in the past year? 0  Number falls in past yr: 0  Injury with Fall? 0  Risk for fall due to : No Fall Risks  Follow up Falls evaluation completed    MEDICARE RISK AT HOME:    TIMED UP AND GO:  Was the test performed?  Yes  Length of time to ambulate 10 feet: 15 sec Gait slow and steady without use of assistive device    Cognitive Function:        01/10/2023   10:10 AM  6CIT Screen  What Year? 0 points  What month? 0 points  What time? 0 points  Count back from 20 0 points  Months in reverse 0 points  Repeat phrase 0 points  Total Score 0 points    Immunizations Immunization History  Administered Date(s) Administered   Influenza,inj,Quad PF,6+ Mos 02/13/2016, 11/19/2016, 12/25/2017   Influenza-Unspecified 10/30/2022   Moderna Covid-19 Fall Seasonal Vaccine 27yrs & older 10/30/2022   Moderna Sars-Covid-2 Vaccination 02/23/2019, 03/23/2019   Tdap 05/30/2021    TDAP status: Up to date  Flu Vaccine status: Up to date  Pneumococcal vaccine status: Up to date  Covid-19 vaccine status: Completed vaccines  Qualifies for Shingles Vaccine? Yes   Zostavax completed Yes   Shingrix Completed?: Yes  Screening Tests Health Maintenance  Topic Date Due   DEXA SCAN  Never done   COVID-19 Vaccine (4 - 2024-25 season) 12/25/2022   Zoster Vaccines- Shingrix (1 of 2) 02/15/2023 (Originally 11/20/1974)   Pneumonia Vaccine 47+ Years old (1 of 2 - PCV) 11/15/2023 (Originally 11/20/1961)   Medicare Annual Wellness (AWV)  01/10/2024   MAMMOGRAM  03/28/2024   Colonoscopy  10/01/2028   DTaP/Tdap/Td  (2 - Td or Tdap) 05/31/2031   INFLUENZA VACCINE  Completed   Hepatitis C Screening  Completed   HPV VACCINES  Aged Out    Health Maintenance  Health Maintenance Due  Topic Date Due   DEXA SCAN  Never done   COVID-19 Vaccine (4 - 2024-25 season) 12/25/2022    Colorectal cancer screening: Type of screening: Colonoscopy. Completed  . Repeat every 5 years  Mammogram status: Completed 03/28/2022. Repeat every year  Bone Density status: Ordered  . Pt provided with contact info and advised to call to schedule appt.  Lung Cancer Screening: (Low Dose CT Chest recommended if Age 30-80 years, 20 pack-year currently smoking OR have quit w/in 15years.) does not qualify.   Lung Cancer Screening Referral: N/A  Additional Screening:  Hepatitis C Screening: does qualify; Completed   Vision Screening: Recommended annual ophthalmology exams for early detection of glaucoma and other disorders of the eye. Is the patient up to date with their annual eye exam?  Yes  Who is the provider or what is the name of the office in which the patient attends annual eye exams? [facility] If pt is not established with a provider, would they like to be referred to a provider to establish care? No .   Dental Screening: Recommended annual dental exams for proper oral hygiene. Had 4 months ago    [employer] Referral / Chronic Care Management: CRR required this visit?  No   CCM required this visit?  No     Plan:   Multiple Sclerosis: Followed by [facility]. Continue current treatment plan, using Rebif Rebidose injection SQ three times weekly. Paroxymal Atrial Flutter; patient is currently in [facility]. Last visit to the cardiologist in 08/2022, patient was advised to get an Apple watch  or FitBit to help her monitor elevated heart rate or change in rhythm. Aortic Atherosclerosis: Check Lipids Menopause Syndrome: Bone density Ordered   I have personally reviewed and noted the following  in the patient's chart:   Medical and social history Use of alcohol, tobacco or illicit drugs  Current medications and supplements including opioid prescriptions. Patient is not currently taking opioid prescriptions. Functional ability and status Nutritional status Physical activity Advanced directives List of other physicians Hospitalizations, surgeries, and ER visits in previous 12 months Vitals Screenings to include cognitive, depression, and falls Referrals and appointments  In addition, I have reviewed and discussed with patient certain preventive protocols, quality metrics, and best practice recommendations. A written personalized care plan for preventive services as well as general preventive health recommendations were provided to patient.    I, Ellender Hose, NP, have reviewed all documentation for this visit. The documentation on 01/19/2023 for the exam, diagnosis, procedures, and orders are all accurate and complete.

## 2023-01-10 NOTE — Patient Instructions (Signed)
 Managing Pain Without Opioids Opioids are strong medicines used to treat moderate to severe pain. For some people, especially those who have long-term (chronic) pain, opioids may not be the best choice for pain management due to: Side effects like nausea, constipation, and sleepiness. The risk of addiction (opioid use disorder). The longer you take opioids, the greater your risk of addiction. Pain that lasts for more than 3 months is called chronic pain. Managing chronic pain usually requires more than one approach and is often provided by a team of health care providers working together (multidisciplinary approach). Pain management may be done at a pain management center or pain clinic. How to manage pain without the use of opioids Use non-opioid medicines Non-opioid medicines for pain may include: Over-the-counter or prescription non-steroidal anti-inflammatory drugs (NSAIDs). These may be the first medicines used for pain. They work well for muscle and bone pain, and they reduce swelling. Acetaminophen. This over-the-counter medicine may work well for milder pain but not swelling. Antidepressants. These may be used to treat chronic pain. A certain type of antidepressant (tricyclics) is often used. These medicines are given in lower doses for pain than when used for depression. Anticonvulsants. These are usually used to treat seizures but may also reduce nerve (neuropathic) pain. Muscle relaxants. These relieve pain caused by sudden muscle tightening (spasms). You may also use a pain medicine that is applied to the skin as a patch, cream, or gel (topical analgesic), such as a numbing medicine. These may cause fewer side effects than medicines taken by mouth. Do certain therapies as directed Some therapies can help with pain management. They include: Physical therapy. You will do exercises to gain strength and flexibility. A physical therapist may teach you exercises to move and stretch parts of  your body that are weak, stiff, or painful. You can learn these exercises at physical therapy visits and practice them at home. Physical therapy may also involve: Massage. Heat wraps or applying heat or cold to affected areas. Electrical signals that interrupt pain signals (transcutaneous electrical nerve stimulation, TENS). Weak lasers that reduce pain and swelling (low-level laser therapy). Signals from your body that help you learn to regulate pain (biofeedback). Occupational therapy. This helps you to learn ways to function at home and work with less pain. Recreational therapy. This involves trying new activities or hobbies, such as a physical activity or drawing. Mental health therapy, including: Cognitive behavioral therapy (CBT). This helps you learn coping skills for dealing with pain. Acceptance and commitment therapy (ACT) to change the way you think and react to pain. Relaxation therapies, including muscle relaxation exercises and mindfulness-based stress reduction. Pain management counseling. This may be individual, family, or group counseling.  Receive medical treatments Medical treatments for pain management include: Nerve block injections. These may include a pain blocker and anti-inflammatory medicines. You may have injections: Near the spine to relieve chronic back or neck pain. Into joints to relieve back or joint pain. Into nerve areas that supply a painful area to relieve body pain. Into muscles (trigger point injections) to relieve some painful muscle conditions. A medical device placed near your spine to help block pain signals and relieve nerve pain or chronic back pain (spinal cord stimulation device). Acupuncture. Follow these instructions at home Medicines Take over-the-counter and prescription medicines only as told by your health care provider. If you are taking pain medicine, ask your health care providers about possible side effects to watch out for. Do not  drive or use heavy machinery  while taking prescription opioid pain medicine. Lifestyle  Do not use drugs or alcohol to reduce pain. If you drink alcohol, limit how much you have to: 0-1 drink a day for women who are not pregnant. 0-2 drinks a day for men. Know how much alcohol is in a drink. In the [place] one drink equals one 12 oz bottle of beer (355 mL), one 5 oz glass of wine (148 mL), or one 1 oz glass of hard liquor (44 mL). Do not use any products that contain nicotine or tobacco. These products include cigarettes, chewing tobacco, and vaping devices, such as e-cigarettes. If you need help quitting, ask your health care provider. Eat a healthy diet and maintain a healthy weight. Poor diet and excess weight may make pain worse. Eat foods that are high in fiber. These include fresh fruits and vegetables, whole grains, and beans. Limit foods that are high in fat and processed sugars, such as fried and sweet foods. Exercise regularly. Exercise lowers stress and may help relieve pain. Ask your health care provider what activities and exercises are safe for you. If your health care provider approves, join an exercise class that combines movement and stress reduction. Examples include yoga and tai chi. Get enough sleep. Lack of sleep may make pain worse. Lower stress as much as possible. Practice stress reduction techniques as told by your therapist. General instructions Work with all your pain management providers to find the treatments that work best for you. You are an important member of your pain management team. There are many things you can do to reduce pain on your own. Consider joining an online or in-person support group for people who have chronic pain. Keep all follow-up visits. This is important. Where to find more information You can find more information about managing pain without opioids from: American Academy of Pain Medicine: painmed.org Institute for Chronic Pain:  instituteforchronicpain.org American Chronic Pain Association: theacpa.org Contact a health care provider if: You have side effects from pain medicine. Your pain gets worse or does not get better with treatments or home therapy. You are struggling with anxiety or depression. Summary Many types of pain can be managed without opioids. Chronic pain may respond better to pain management without opioids. Pain is best managed when you and a team of health care providers work together. Pain management without opioids may include non-opioid medicines, medical treatments, physical therapy, mental health therapy, and lifestyle changes. Tell your health care providers if your pain gets worse or is not being managed well enough. This information is not intended to replace advice given to you by your health care provider. Make sure you discuss any questions you have with your health care provider. Document Revised: 04/28/2020 Document Reviewed: 04/28/2020 Elsevier Patient Education  2024 [employer].

## 2023-01-11 LAB — LIPID PANEL
Chol/HDL Ratio: 4 {ratio} (ref 0.0–4.4)
Cholesterol, Total: 234 mg/dL — ABNORMAL HIGH (ref 100–199)
HDL: 58 mg/dL (ref >39)
LDL Chol Calc (NIH): 167 mg/dL — ABNORMAL HIGH (ref 0–99)
Triglycerides: 55 mg/dL (ref 0–149)
VLDL Cholesterol Cal: 9 mg/dL (ref 5–40)

## 2023-01-11 LAB — CBC
Hematocrit: 34.7 % (ref 34.0–46.6)
Hemoglobin: 10.9 g/dL — ABNORMAL LOW (ref 11.1–15.9)
MCH: 24.8 pg — ABNORMAL LOW (ref 26.6–33.0)
MCHC: 31.4 g/dL — ABNORMAL LOW (ref 31.5–35.7)
MCV: 79 fL (ref 79–97)
Platelets: 224 10*3/uL (ref 150–450)
RBC: 4.39 x10E6/uL (ref 3.77–5.28)
RDW: 14.6 % (ref 11.7–15.4)
WBC: 5.7 10*3/uL (ref 3.4–10.8)

## 2023-01-11 LAB — CMP14+EGFR
ALT: 20 [IU]/L (ref 0–32)
AST: 19 [IU]/L (ref 0–40)
Albumin: 4 g/dL (ref 3.9–4.9)
Alkaline Phosphatase: 71 [IU]/L (ref 44–121)
BUN/Creatinine Ratio: 18 (ref 12–28)
BUN: 16 mg/dL (ref 8–27)
Bilirubin Total: 0.4 mg/dL (ref 0.0–1.2)
CO2: 21 mmol/L (ref 20–29)
Calcium: 9.6 mg/dL (ref 8.7–10.3)
Chloride: 101 mmol/L (ref 96–106)
Creatinine, Ser: 0.89 mg/dL (ref 0.57–1.00)
Globulin, Total: 3.8 g/dL (ref 1.5–4.5)
Glucose: 100 mg/dL — ABNORMAL HIGH (ref 70–99)
Potassium: 5 mmol/L (ref 3.5–5.2)
Sodium: 138 mmol/L (ref 134–144)
Total Protein: 7.8 g/dL (ref 6.0–8.5)
eGFR: 71 mL/min/{1.73_m2} (ref >59)

## 2023-01-11 LAB — HEPATITIS C ANTIBODY: Hep C Virus Ab: NONREACTIVE

## 2023-01-15 ENCOUNTER — Other Ambulatory Visit: Payer: Self-pay

## 2023-01-15 DIAGNOSIS — G35 Multiple sclerosis: Secondary | ICD-10-CM

## 2023-01-15 MED ORDER — REBIF REBIDOSE 44 MCG/0.5ML ~~LOC~~ SOAJ: 44.0000 ug | SUBCUTANEOUS | 2 refills | Status: DC

## 2023-01-19 DIAGNOSIS — N951 Menopausal and female climacteric states: Secondary | ICD-10-CM | POA: Insufficient documentation

## 2023-01-19 DIAGNOSIS — R059 Cough, unspecified: Secondary | ICD-10-CM | POA: Insufficient documentation

## 2023-01-19 DIAGNOSIS — I700 Atherosclerosis of aorta: Secondary | ICD-10-CM | POA: Insufficient documentation

## 2023-01-19 DIAGNOSIS — Z1159 Encounter for screening for other viral diseases: Secondary | ICD-10-CM | POA: Insufficient documentation

## 2023-01-30 ENCOUNTER — Other Ambulatory Visit: Payer: Self-pay | Admitting: Family Medicine

## 2023-01-30 ENCOUNTER — Encounter: Payer: Self-pay | Admitting: Family Medicine

## 2023-01-30 NOTE — Progress Notes (Signed)
Blood count is slight low, do you take an iron supplement or a multivitamin? Cholesterol levels are elevated. Are you on cholesterol lowering medicine?  Thanks!

## 2023-02-10 ENCOUNTER — Encounter: Payer: Self-pay | Admitting: Family Medicine

## 2023-02-15 ENCOUNTER — Other Ambulatory Visit: Payer: Self-pay | Admitting: Family Medicine

## 2023-02-15 MED ORDER — ATORVASTATIN CALCIUM 20 MG PO TABS: 20.0000 mg | ORAL_TABLET | Freq: Every day | ORAL | 11 refills | Status: DC

## 2023-04-19 ENCOUNTER — Telehealth: Payer: Self-pay | Admitting: Neurology

## 2023-04-19 DIAGNOSIS — G35 Multiple sclerosis: Secondary | ICD-10-CM

## 2023-04-19 MED ORDER — REBIF REBIDOSE 44 MCG/0.5ML ~~LOC~~ SOAJ: 44.0000 ug | SUBCUTANEOUS | 2 refills | Status: DC

## 2023-04-19 NOTE — Telephone Encounter (Signed)
 Pt called stating that she is needing her Interferon Beta-1a (REBIF REBIDOSE) 44 MCG/0.5ML SOAJ sent in to the [facility] on [address] from now on and is needing a refill asap.

## 2023-04-19 NOTE — Telephone Encounter (Signed)
 refilled

## 2023-05-22 ENCOUNTER — Telehealth: Payer: Self-pay | Admitting: Pharmacist

## 2023-05-22 DIAGNOSIS — M545 Low back pain, unspecified: Secondary | ICD-10-CM

## 2023-05-22 NOTE — Progress Notes (Signed)
   05/22/2023  Patient ID: Sherri Ramirez, female   DOB: 02-03-1955, 68 y.o.   MRN: [id number]  Patient called and requested a refill of Tramadol -acetaminophen . She reported that she was moving wrought iron furniture and hurt her back.  She also tried ibuprofen , acetaminophen , and a heating pad without relief. She described the pain as 8/10 across her right side of her back to her waist. Patient said she usually only takes 1/2 tablet at a time.  Plan: Send note over to the Provider with request from patient.  Geronimo Krabbe, PharmD, BCACP Clinical Pharmacist [phone]

## 2023-05-23 ENCOUNTER — Other Ambulatory Visit: Payer: Self-pay | Admitting: Family Medicine

## 2023-05-23 MED ORDER — TRAMADOL-ACETAMINOPHEN 37.5-325 MG PO TABS: 1.0000 | ORAL_TABLET | Freq: Four times a day (QID) | ORAL | 0 refills | Status: AC | PRN

## 2023-07-21 ENCOUNTER — Other Ambulatory Visit: Payer: Self-pay | Admitting: Neurology

## 2023-07-21 DIAGNOSIS — G35 Multiple sclerosis: Secondary | ICD-10-CM

## 2023-07-23 NOTE — Telephone Encounter (Signed)
 Last seen on 09/13/22 Follow up scheduled on 09/05/23

## 2023-09-05 ENCOUNTER — Encounter: Payer: Self-pay | Admitting: Neurology

## 2023-09-05 ENCOUNTER — Ambulatory Visit: Payer: Medicare Other | Admitting: Neurology

## 2023-09-05 VITALS — BP 126/81 | HR 89 | Ht 65.0 in | Wt 115.5 lb

## 2023-09-05 DIAGNOSIS — G40209 Localization-related (focal) (partial) symptomatic epilepsy and epileptic syndromes with complex partial seizures, not intractable, without status epilepticus: Secondary | ICD-10-CM | POA: Diagnosis not present

## 2023-09-05 DIAGNOSIS — G35 Multiple sclerosis: Secondary | ICD-10-CM | POA: Diagnosis not present

## 2023-09-05 NOTE — Patient Instructions (Signed)
 Great to see you today.  Continue current medications.  Will plan to recheck MRI of the brain next year. Recommend exercise, activity.  Follow-up in 1 year.  Reach out if you need anything.  Thanks!!

## 2023-09-05 NOTE — Progress Notes (Signed)
 [facility]  PATIENT: Sherri Ramirez DOB: 03-29-1955  REFERRING DOCTOR OR PCP: Suzen Lamer, MD SOURCE: Patient, note from Dr. Jenel and Lauraine Born, FNP, imaging and lab reports, _________________________________  HISTORICAL  CHIEF COMPLAINT:  Chief Complaint  Patient presents with   Multiple Sclerosis    Room in EMG room 1  Pt is here follow up for MS pt stated that she has no new concerns     Update 09/05/23 SS: EEG was normal.  MRI of the cervical spine showed T2 hyperintense focus at C3-C4 about 16 mm in length, likely primary source of right-sided symptoms. Dr. Vear felt this lesion could have caused phasic spasms that was mistakenly diagnosed as seizure.  She stopped Dilantin .  There are smaller MS lesions at C5-6 and C3-4.  Recommended she remain on Rebif .  No seizure events since off Dilantin .  Has remained on Rebif .  Continues with chronic weakness mostly to the right leg.  Works part-time at [employer].  Is independent, manages the household, lives with her husband.  Vision is fine other than wearing glasses. B/b are doing good.   HISTORY OF PRESENT ILLNESS:  09/05/22 Dr. Vear: I had the pleasure seeing your patient, Sherri Ramirez, at [facility] for neurologic consultation regarding her multiple sclerosis and history of seizures.  She was first diagnosed with MS in 1992 after presenting with diplopia. This lasted one month.  At some point in the 1990's she noted right leg weakness.SABRA   She was placed on Dilantin  and had no other episodes.     She was initially on Copaxone but had allergic reaction.  She then was switched to Betaseron and was on that between 2005 and 2015 when she was switched to Rebif  for insurance purposes.    She ws initially seeing Dr. Isidore, then Dr. Vinetta and then saw Dr. Jenel.    She has never had a relapse since.  She reports having 3 LP's and being told the CSF was normal on one.  She had a couple spells  of right arm posturing not associated with LOC.  She was told these were seizures and started Dilantin .   No more episodes since the late 1990's.     She has had a limp several years.  She could walk a couple miles but feels gait is not as good as it was a few years ago.  She holds the bannister on stairs x many years.   She has no recent falls.    She has mild weakness in her right leg x many years.  Arms are strong .  No numbness.  Bladder function is fine.   Vision is good with glasses.  She notes occasional forgetfulness but always recall with hints.    She sometimes notes mild word finding difficulty.  Otherwise cogntion is fine.  Mood is  doing well   Imaging: MRI report from 03/28/2002 showed several white matter type changes suspicious for demyelinating process such as MS.  The actual images were not available.   REVIEW OF SYSTEMS: See HPI  ALLERGIES: Allergies  Allergen Reactions   Carbamazepine    Glatiramer Acetate     REACTION: anaphylaxis    HOME MEDICATIONS:  Current Outpatient Medications:    albuterol (VENTOLIN HFA) 108 (90 Base) MCG/ACT inhaler, Proventil HFA 90 mcg/actuation aerosol inhaler  Inhale 2 puffs every 4 hours by inhalation route., Disp: , Rfl:    atorvastatin  (LIPITOR) 20 MG tablet, Take 1 tablet (  20 mg total) by mouth daily., Disp: 30 tablet, Rfl: 11   benzonatate  (TESSALON  PERLES) 100 MG capsule, Take 1 capsule (100 mg total) by mouth 3 (three) times daily as needed for cough., Disp: 30 capsule, Rfl: 0   REBIF  REBIDOSE 44 MCG/0.5ML SOAJ, INJECT INTO THE SKIN 3 TIMES WEEKLY, Disp: 12 mL, Rfl: 1   VITAMIN D PO, Take 1 tablet by mouth daily., Disp: , Rfl:   PAST MEDICAL HISTORY: Past Medical History:  Diagnosis Date   Anemia    a. drop from from 12->8.6 in 11/2016.   Atrial flutter, paroxysmal (HCC)    a. dx 11/2016.   Former tobacco use    Hypokalemia    MS (multiple sclerosis) (HCC)    Pericarditis    a. dx 11/2016.   Seizure (HCC)     Volume overload    a. in setting of atrial flutter 11/2016. Echo actually showed normal EF and normal diastolic function so was likely due to atrial flutter.    PAST SURGICAL HISTORY: Past Surgical History:  Procedure Laterality Date   ABDOMINAL HYSTERECTOMY     BREAST EXCISIONAL BIOPSY Left pt unsure   benign   HYSTEROTOMY      FAMILY HISTORY: Family History  Problem Relation Age of Onset   Colon cancer Mother    Heart attack Father     SOCIAL HISTORY: Social History   Socioeconomic History   Marital status: Married    Spouse name: [name]   Number of children: 0   Years of education: Not on file   Highest education level: Not on file  Occupational History   Not on file  Tobacco Use   Smoking status: Former    Passive exposure: Never   Smokeless tobacco: Never  Vaping Use   Vaping status: Never Used  Substance and Sexual Activity   Alcohol use: No   Drug use: No   Sexual activity: Not on file  Other Topics Concern   Not on file  Social History Narrative   Patient is married and does not work.    Patient quit smoking in 2007.   Social Drivers of Corporate investment banker Strain: Low Risk  (01/10/2023)   Overall Financial Resource Strain (CARDIA)    Difficulty of Paying Living Expenses: Not hard at all  Food Insecurity: No Food Insecurity (01/10/2023)   Hunger Vital Sign    Worried About Running Out of Food in the Last Year: Never true    Ran Out of Food in the Last Year: Never true  Transportation Needs: No Transportation Needs (01/10/2023)   PRAPARE - Administrator, Civil Service (Medical): No    Lack of Transportation (Non-Medical): No  Physical Activity: Inactive (01/10/2023)   Exercise Vital Sign    Days of Exercise per Week: 0 days    Minutes of Exercise per Session: 0 min  Stress: No Stress Concern Present (01/10/2023)   [employer] of Occupational Health - Occupational Stress Questionnaire    Feeling of Stress : Not at all   Social Connections: Moderately Integrated (01/10/2023)   Social Connection and Isolation Panel    Frequency of Communication with Friends and Family: Once a week    Frequency of Social Gatherings with Friends and Family: More than three times a week    Attends Religious Services: More than 4 times per year    Active Member of [employer] or Organizations: No    Attends Banker Meetings: Never  Marital Status: Married  Catering manager Violence: Not At Risk (01/10/2023)   Humiliation, Afraid, Rape, and Kick questionnaire    Fear of Current or Ex-Partner: No    Emotionally Abused: No    Physically Abused: No    Sexually Abused: No      PHYSICAL EXAM  Vitals:   09/05/23 1251  BP: 126/81  Pulse: 89  SpO2: 98%  Weight: 115 lb 8 oz (52.4 kg)  Height: 5' 5 (1.651 m)   Physical Exam  General: The patient is alert and cooperative at the time of the examination.  Skin: No significant peripheral edema is noted.  Neurologic Exam  Mental status: The patient is alert and oriented x 3 at the time of the examination. The patient has apparent normal recent and remote memory, with an apparently normal attention span and concentration ability.  Cranial nerves: Facial symmetry is present. Speech is normal, no aphasia or dysarthria is noted. Extraocular movements are full. Visual fields are full.  Motor: 4/5 right leg  Sensory examination: Soft touch sensation is symmetric on the face, arms, and legs.  Coordination: The patient has good finger-nose-finger and heel-to-shin bilaterally.  Gait and station: Right foot drop, gait is cautious but independent  Reflexes: Deep tendon reflexes are symmetric and normal.    DIAGNOSTIC DATA (LABS, IMAGING, TESTING) - I reviewed patient records, labs, notes, testing and imaging myself where available.  Lab Results  Component Value Date   WBC 5.7 01/10/2023   HGB 10.9 (L) 01/10/2023   HCT 34.7 01/10/2023   MCV 79 01/10/2023   PLT  224 01/10/2023      Component Value Date/Time   NA 138 01/10/2023 1113   K 5.0 01/10/2023 1113   CL 101 01/10/2023 1113   CO2 21 01/10/2023 1113   GLUCOSE 100 (H) 01/10/2023 1113   GLUCOSE 93 12/29/2016 0423   BUN 16 01/10/2023 1113   CREATININE 0.89 01/10/2023 1113   CALCIUM  9.6 01/10/2023 1113   PROT 7.8 01/10/2023 1113   ALBUMIN 4.0 01/10/2023 1113   AST 19 01/10/2023 1113   ALT 20 01/10/2023 1113   ALKPHOS 71 01/10/2023 1113   BILITOT 0.4 01/10/2023 1113   GFRNONAA 89 03/18/2020 1334   GFRAA 103 03/18/2020 1334   Lab Results  Component Value Date   CHOL 234 (H) 01/10/2023   HDL 58 01/10/2023   LDLCALC 167 (H) 01/10/2023   TRIG 55 01/10/2023   CHOLHDL 4.0 01/10/2023   Lab Results  Component Value Date   HGBA1C 5.0 12/26/2016   Lab Results  Component Value Date   VITAMINB12 388 12/26/2016   Lab Results  Component Value Date   TSH 0.837 09/01/2022     ASSESSMENT AND PLAN  1.  Multiple sclerosis 2.  Partial complex seizures 3.  Gait abnormality  -Has done well off Dilantin , no recurrent seizure type events  - EEG was normal August 2024  - Dr. Vear was suspicious spells of seizure may have actually been phasic spasms  - Continue Rebif    - MRI cervical spine September 2024 showed T2 hyperintense focus at C3-4 about 16 mm in length, felt to be primary source of right sided symptoms, could also explain phasic spasms mistakenly felt to be seizures.  2 smaller lesions at C5-6 and C3-4  - MRI of the brain showed multiple areas of MS lesions, with presence of T1 black holes - Plan to recheck MRI of the brain with and without contrast in 2026, will then discuss discontinuing  DMT given her age while considering the aggressiveness of her disease - Follow-up with me in 1 year  Lauraine Born, SCHARLENE, DNP  [facility] [address] [place], [place] [address] [phone]

## 2023-09-26 ENCOUNTER — Encounter: Payer: Self-pay | Admitting: Pharmacist

## 2023-09-26 ENCOUNTER — Telehealth: Payer: Self-pay | Admitting: Pharmacist

## 2023-09-26 DIAGNOSIS — Z79899 Other long term (current) drug therapy: Secondary | ICD-10-CM

## 2023-09-26 NOTE — Progress Notes (Signed)
   09/26/2023  Patient ID: Sherri Ramirez, female   DOB: February 24, 1955, 68 y.o.   MRN: [id number]  Patient called to request something to treat what she believes is ringworm on her face. She reported using multiple creams over-the-counter without success.  An appointment was made for her to see [name], NP on Wednesday 10/03/2023 at 10:20am.   Cassius DOROTHA Brought, PharmD, BCACP Clinical Pharmacist [phone]

## 2023-09-26 NOTE — Progress Notes (Signed)
 error

## 2023-09-27 ENCOUNTER — Encounter: Payer: Self-pay | Admitting: Nurse Practitioner

## 2023-09-27 ENCOUNTER — Ambulatory Visit: Admitting: Nurse Practitioner

## 2023-09-27 VITALS — BP 130/80 | HR 80 | Temp 99.5°F | Ht 65.0 in | Wt 115.4 lb

## 2023-09-27 DIAGNOSIS — Z2821 Immunization not carried out because of patient refusal: Secondary | ICD-10-CM | POA: Diagnosis not present

## 2023-09-27 DIAGNOSIS — R21 Rash and other nonspecific skin eruption: Secondary | ICD-10-CM

## 2023-09-27 MED ORDER — TERBINAFINE HCL 1 % EX CREA: 1.0000 | TOPICAL_CREAM | Freq: Two times a day (BID) | CUTANEOUS | 1 refills | Status: DC

## 2023-09-27 MED ORDER — FLUCONAZOLE 100 MG PO TABS: 100.0000 mg | ORAL_TABLET | Freq: Every day | ORAL | 0 refills | Status: DC

## 2023-09-27 NOTE — Progress Notes (Signed)
 Sherri Ramirez, CMA,acting as a Neurosurgeon for Sherri Ada, FNP.,have documented all relevant documentation on the behalf of Sherri Ada, FNP,as directed by  Sherri Ada, FNP while in the presence of Sherri Ada, FNP.  Subjective:  Patient ID: Sherri Ramirez , female    DOB: [birth year] , 68 y.o.   MRN: [id number]  Chief Complaint  Patient presents with   Rash    Patient presents today for a rash on her face, patient reports she first saw it 3 weeks ago and has used OTC medications with no relief.      HPI  Discussed the use of AI scribe software for clinical note transcription with the patient, who gave verbal consent to proceed.  History of Present Illness Sherri Ramirez is a 68 year old female who presents with a facial rash.  She has had a rash on her face for approximately three weeks. The rash is itchy and occasionally burning, primarily located on her face, with no known spread to other areas of her body. She has attempted to treat it with over-the-counter antifungal cream, but has not experienced any relief.  She has been trying to avoid touching the affected areas and is concerned because the rash is also present on her eyelids. She recalls having a similar condition, identified as ringworm, about fifteen years ago on her forearm. No drainage from the current rash. She has been using Benadryl and Blue Star ointment to manage the itching, but these have not been effective.  She is currently taking albuterol, atorvastatin , Rebif , and vitamin D. She works part-time at [employer] and is not aware of any recent contact with individuals who have a rash.   Past Medical History:  Diagnosis Date   Anemia    a. drop from from 12->8.6 in 11/2016.   Atrial flutter, paroxysmal (HCC)    a. dx 11/2016.   Former tobacco use    Hypokalemia    MS (multiple sclerosis) (HCC)    Pericarditis    a. dx 11/2016.   Seizure (HCC)    Volume overload    a. in setting of atrial flutter 11/2016.  Echo actually showed normal EF and normal diastolic function so was likely due to atrial flutter.     Family History  Problem Relation Age of Onset   Colon cancer Mother    Heart attack Father      Current Outpatient Medications:    albuterol (VENTOLIN HFA) 108 (90 Base) MCG/ACT inhaler, Proventil HFA 90 mcg/actuation aerosol inhaler  Inhale 2 puffs every 4 hours by inhalation route., Disp: , Rfl:    atorvastatin  (LIPITOR) 20 MG tablet, Take 1 tablet (20 mg total) by mouth daily., Disp: 30 tablet, Rfl: 11   fluconazole  (DIFLUCAN ) 100 MG tablet, Take 1 tablet (100 mg total) by mouth daily. Take 1 tablet by mouth now repeat in 5 days (Patient not taking: Reported on 10/04/2023), Disp: 2 tablet, Rfl: 0   REBIF  REBIDOSE 44 MCG/0.5ML SOAJ, INJECT INTO THE SKIN 3 TIMES WEEKLY, Disp: 12 mL, Rfl: 1   terbinafine  (LAMISIL ) 1 % cream, Apply 1 Application topically 2 (two) times daily. (Patient not taking: Reported on 10/04/2023), Disp: 28.4 g, Rfl: 1   VITAMIN D PO, Take 1 tablet by mouth daily., Disp: , Rfl:    benzonatate  (TESSALON  PERLES) 100 MG capsule, Take 1 capsule (100 mg total) by mouth 3 (three) times daily as needed for cough. (Patient not taking: Reported on 10/04/2023), Disp: 30 capsule,  Rfl: 0   meloxicam  (MOBIC ) 15 MG tablet, Take 1 tablet (15 mg total) by mouth daily., Disp: 30 tablet, Rfl: 0   Allergies  Allergen Reactions   Carbamazepine    Glatiramer Acetate     REACTION: anaphylaxis     Review of Systems  Constitutional: Negative.   Respiratory: Negative.    Cardiovascular: Negative.   Skin:  Positive for rash (reports multiple areas of rash both cheeks, center of forehead and chin).  Neurological: Negative.   Psychiatric/Behavioral: Negative.       Today's Vitals   09/27/23 1428  BP: 130/80  Pulse: 80  Temp: 99.5 F (37.5 C)  TempSrc: Oral  Weight: 115 lb 6.4 oz (52.3 kg)  Height: 5' 5 (1.651 m)  PainSc: 0-No pain   Body mass index is 19.2 kg/m.  Wt  Readings from Last 3 Encounters:  10/04/23 113 lb (51.3 kg)  09/27/23 115 lb 6.4 oz (52.3 kg)  09/05/23 115 lb 8 oz (52.4 kg)    q  Objective:  Physical Exam Vitals and nursing note reviewed.  Constitutional:      General: She is not in acute distress.    Appearance: Normal appearance.  Pulmonary:     Effort: Pulmonary effort is normal. No respiratory distress.  Skin:    General: Skin is warm and dry.     Comments: Area to right cheek with light appearance of circular type dry rash.   Neurological:     General: No focal deficit present.     Mental Status: She is alert and oriented to person, place, and time.     Cranial Nerves: No cranial nerve deficit.     Motor: No weakness.  Psychiatric:        Mood and Affect: Mood normal.        Behavior: Behavior normal.        Thought Content: Thought content normal.         Assessment And Plan:  Rash Assessment & Plan: Facial rash unresponsive to over the counter topical antifungal, systemic treatment chosen due to facial involvement and proximity to eyes. Avoided antifungal-steroid combination to prevent exacerbation. - Prescribed terbinafine  cream, sent to pharmacy. - Prescribed fluconazole  tablets for today and in five days. - Advised plain hydrocortisone cream for pruritus, separate from antifungal. - Recommended loratadine or fexofenadine to avoid drowsiness. - Instructed on hand hygiene and avoiding rash contact. - Advised to call if symptoms worsen or issues arise.   Herpes zoster vaccination declined  COVID-19 vaccination declined  Other orders -     Fluconazole ; Take 1 tablet (100 mg total) by mouth daily. Take 1 tablet by mouth now repeat in 5 days (Patient not taking: Reported on 10/04/2023)  Dispense: 2 tablet; Refill: 0 -     Terbinafine  HCl; Apply 1 Application topically 2 (two) times daily. (Patient not taking: Reported on 10/04/2023)  Dispense: 28.4 g; Refill: 1   Return in about 4 months (around 01/27/2024) for  needs phy with PAT.  Patient was given opportunity to ask questions. Patient verbalized understanding of the plan and was able to repeat key elements of the plan. All questions were answered to their satisfaction.    Sherri Sherri Ada, FNP, have reviewed all documentation for this visit. The documentation on 09/27/23 for the exam, diagnosis, procedures, and orders are all accurate and complete.   IF YOU HAVE BEEN REFERRED TO A SPECIALIST, IT MAY TAKE 1-2 WEEKS TO SCHEDULE/PROCESS THE REFERRAL. IF YOU HAVE NOT  HEARD FROM US /SPECIALIST IN TWO WEEKS, PLEASE GIVE US  A CALL AT [phone].

## 2023-10-03 ENCOUNTER — Ambulatory Visit: Admitting: Family Medicine

## 2023-10-04 ENCOUNTER — Encounter: Payer: Self-pay | Admitting: Family Medicine

## 2023-10-04 ENCOUNTER — Ambulatory Visit: Admitting: Family Medicine

## 2023-10-04 VITALS — BP 126/88 | HR 56 | Wt 113.0 lb

## 2023-10-04 DIAGNOSIS — M545 Low back pain, unspecified: Secondary | ICD-10-CM

## 2023-10-04 LAB — POCT URINE DIPSTICK
Bilirubin, UA: NEGATIVE
Glucose, UA: NEGATIVE mg/dL
Ketones, POC UA: NEGATIVE mg/dL
Nitrite, UA: NEGATIVE
POC PROTEIN,UA: NEGATIVE
Spec Grav, UA: 1.015 (ref 1.010–1.025)
Urobilinogen, UA: 0.2 U/dL
pH, UA: 6 (ref 5.0–8.0)

## 2023-10-04 MED ORDER — MELOXICAM 15 MG PO TABS: 15.0000 mg | ORAL_TABLET | Freq: Every day | ORAL | 0 refills | Status: DC

## 2023-10-04 NOTE — Progress Notes (Signed)
 Name: Sherri Ramirez   Date of Visit: 10/04/23   Date of last visit with me: Visit date not found   CHIEF COMPLAINT:  Chief Complaint  Patient presents with   Acute Visit    Back pain.  All on right side. Last few days she has been winded when she does stuff, been using inhaler. More fatigue.        HPI:  Discussed the use of AI scribe software for clinical note transcription with the patient, who gave verbal consent to proceed.  History of Present Illness   Sherri Ramirez is a 68 year old female who presents with back pain. She is accompanied by her husband.  She has been experiencing constant back pain for a few days, located on the right side of her spine, which worsens with breathing and twisting movements. The pain has not been relieved by using a heating pad or taking acetaminophen .  A few weeks ago, she twisted her back, which may have contributed to the current pain. She denies any recent falls or injuries. The pain does not radiate down her legs.  She feels winded and easily fatigued over the past few days, particularly during exertion, but feels fine when lying down. She has an inhaler but has not had a recent cardiac workup. She had an echocardiogram two years ago.  Her current medications include acetaminophen  for pain relief, which has been ineffective.  She has a history of multiple sclerosis and has had an MRI of her cervical spine. No shooting pain down her legs, cough, fever, or chills.         OBJECTIVE:       01/10/2023   10:09 AM  Depression screen PHQ 2/9  Decreased Interest 0  Down, Depressed, Hopeless 0  PHQ - 2 Score 0  Altered sleeping 0  Tired, decreased energy 0  Change in appetite 0  Feeling bad or failure about yourself  0  Trouble concentrating 0  Moving slowly or fidgety/restless 0  Suicidal thoughts 0  PHQ-9 Score 0  Difficult doing work/chores Not difficult at all     BP Readings from Last 3 Encounters:  10/04/23 126/88  09/27/23  130/80  09/05/23 126/81    BP 126/88   Pulse (!) 56   Wt 113 lb (51.3 kg)   SpO2 96%   BMI 18.80 kg/m    Physical Exam   MUSCULOSKELETAL: Tenderness over the spine and right paraspinal muscles.      Physical Exam Musculoskeletal:        General: Tenderness present.     Comments: TTP over the spine at L3/L4, TTP on the paraspinals on the right side at l3/4. Rom is full though pain noted with side bending and twisting.     Neurological:     Mental Status: She is alert.      ASSESSMENT/PLAN:   Assessment & Plan Lumbar back pain    Assessment and Plan    Low back pain Likely muscular origin. Tenderness over spine suggests need for imaging to rule out bone involvement. Differential includes muscular strain versus bone pathology. - Order lumbar spine x-ray at [facility]. - Prescribe meloxicam  for 10-14 days. Take with food once daily. - Advise continued use of heating pad. - Provide work note for Monday and Wednesday off. - Advise against using ice.  Fatigue and shortness of breath Potential cardiac causes discussed. Need for further evaluation by primary care provider. - Follow up with primary care  provider. - Discuss potential need for coronary artery calcium  score and echocardiogram.  Bone density screening DEXA scan ordered previously but not completed. - Advise contacting the breast center to schedule the DEXA scan.         Gerrit Rafalski A. Vita MD [facility]

## 2023-10-09 ENCOUNTER — Ambulatory Visit
Admission: RE | Admit: 2023-10-09 | Discharge: 2023-10-09 | Disposition: A | Discharge status: Home / Self Care | Source: Ambulatory Visit | Attending: Family Medicine | Admitting: Family Medicine

## 2023-10-09 DIAGNOSIS — M545 Low back pain, unspecified: Secondary | ICD-10-CM

## 2023-10-09 DIAGNOSIS — N200 Calculus of kidney: Secondary | ICD-10-CM | POA: Diagnosis not present

## 2023-10-10 DIAGNOSIS — R21 Rash and other nonspecific skin eruption: Secondary | ICD-10-CM | POA: Insufficient documentation

## 2023-10-10 DIAGNOSIS — Z2821 Immunization not carried out because of patient refusal: Secondary | ICD-10-CM | POA: Insufficient documentation

## 2023-10-10 NOTE — Assessment & Plan Note (Addendum)
 Facial rash unresponsive to over the counter topical antifungal, systemic treatment chosen due to facial involvement and proximity to eyes. Avoided antifungal-steroid combination to prevent exacerbation. - Prescribed terbinafine  cream, sent to pharmacy. - Prescribed fluconazole  tablets for today and in five days. - Advised plain hydrocortisone cream for pruritus, separate from antifungal. - Recommended loratadine or fexofenadine to avoid drowsiness. - Instructed on hand hygiene and avoiding rash contact. - Advised to call if symptoms worsen or issues arise.

## 2023-10-18 ENCOUNTER — Ambulatory Visit: Payer: Self-pay

## 2023-10-18 NOTE — Telephone Encounter (Signed)
 FYI Only or Action Required?: FYI only for provider.  Patient was last seen in primary care on 10/04/2023 by [name], MD.  Called Nurse Triage reporting Hematuria.  Symptoms began yesterday.  Interventions attempted: Nothing.  Symptoms are: gradually worsening.  Triage Disposition: See Physician Within 24 Hours  Patient/caregiver understands and will follow disposition?: Yes  Copied from CRM #[id number]. Topic: Clinical - Red Word Triage >> Oct 18, 2023  3:43 PM Carlatta H wrote: Red Word that prompted transfer to Nurse Triage: Patient has blood in urine.. Reason for Disposition  Blood in urine  (Exception: Could be normal menstrual bleeding.)  Answer Assessment - Initial Assessment Questions 1. COLOR of URINE: Describe the color of the urine.  (e.g., tea-colored, pink, red, bloody) Do you have blood clots in your urine? (e.g., none, pea, grape, small coin)     Pink, darkening 2. ONSET: When did the bleeding start?      One day ago 3. EPISODES: How many times has there been blood in the urine? or How many times today?     two 4. PAIN with URINATION: Is there any pain with passing your urine? If Yes, ask: How bad is the pain?  (Scale 1-10; or mild, moderate, severe)     denies 5. FEVER: Do you have a fever? If Yes, ask: What is your temperature, how was it measured, and when did it start?     denies 6. ASSOCIATED SYMPTOMS: Are you passing urine more frequently than usual?     denies 7. OTHER SYMPTOMS: Do you have any other symptoms? (e.g., back/flank pain, abdomen pain, vomiting)     Back pain on right more than left 8. PREGNANCY: Is there any chance you are pregnant? When was your last menstrual period?     N/a  Protocols used: Urine - Blood In-A-AH

## 2023-10-19 ENCOUNTER — Ambulatory Visit: Payer: Self-pay | Admitting: Medical

## 2023-10-19 ENCOUNTER — Ambulatory Visit: Admitting: Medical

## 2023-10-19 ENCOUNTER — Encounter: Payer: Self-pay | Admitting: Medical

## 2023-10-19 VITALS — BP 122/80 | HR 97 | Temp 98.0°F | Wt 116.6 lb

## 2023-10-19 DIAGNOSIS — R310 Gross hematuria: Secondary | ICD-10-CM

## 2023-10-19 DIAGNOSIS — M5489 Other dorsalgia: Secondary | ICD-10-CM | POA: Diagnosis not present

## 2023-10-19 DIAGNOSIS — R109 Unspecified abdominal pain: Secondary | ICD-10-CM

## 2023-10-19 LAB — POCT URINALYSIS DIP (PROADVANTAGE DEVICE)
Bilirubin, UA: NEGATIVE
Glucose, UA: 100 mg/dL — AB
Ketones, POC UA: NEGATIVE mg/dL
Nitrite, UA: NEGATIVE
Protein Ur, POC: 30 mg/dL — AB
Specific Gravity, Urine: 1.005
Urobilinogen, Ur: 0.2
pH, UA: 6 (ref 5.0–8.0)

## 2023-10-19 LAB — CBC WITH DIFFERENTIAL/PLATELET
Basophils Absolute: 0 x10E3/uL (ref 0.0–0.2)
Basos: 0 %
EOS (ABSOLUTE): 0 x10E3/uL (ref 0.0–0.4)
Eos: 1 %
Hematocrit: 31 % — ABNORMAL LOW (ref 34.0–46.6)
Hemoglobin: 9.9 g/dL — ABNORMAL LOW (ref 11.1–15.9)
Lymphocytes Absolute: 1.3 x10E3/uL (ref 0.7–3.1)
Lymphs: 27 %
MCH: 25 pg — ABNORMAL LOW (ref 26.6–33.0)
MCHC: 31.9 g/dL (ref 31.5–35.7)
MCV: 78 fL — ABNORMAL LOW (ref 79–97)
Monocytes Absolute: 0.4 x10E3/uL (ref 0.1–0.9)
Monocytes: 7 %
Neutrophils Absolute: 3.2 x10E3/uL (ref 1.4–7.0)
Neutrophils: 65 %
Platelets: 220 x10E3/uL (ref 150–450)
RBC: 3.96 x10E6/uL (ref 3.77–5.28)
RDW: 15.3 % (ref 11.7–15.4)
WBC: 4.8 x10E3/uL (ref 3.4–10.8)

## 2023-10-19 LAB — BASIC METABOLIC PANEL WITH GFR
BUN/Creatinine Ratio: 16 (ref 12–28)
BUN: 11 mg/dL (ref 8–27)
CO2: 26 mmol/L (ref 20–29)
Calcium: 9.5 mg/dL (ref 8.7–10.3)
Chloride: 103 mmol/L (ref 96–106)
Creatinine, Ser: 0.67 mg/dL (ref 0.57–1.00)
Glucose: 86 mg/dL (ref 70–99)
Potassium: 3.2 mmol/L — ABNORMAL LOW (ref 3.5–5.2)
Sodium: 140 mmol/L (ref 134–144)
eGFR: 96 mL/min/1.73 (ref >59)

## 2023-10-19 MED ORDER — NITROFURANTOIN MONOHYD MACRO 100 MG PO CAPS
100.0000 mg | ORAL_CAPSULE | Freq: Two times a day (BID) | ORAL | 0 refills | Status: DC
Start: 2023-10-19 — End: 2023-12-25

## 2023-10-19 NOTE — Progress Notes (Signed)
 Subjective:  Sherri Ramirez is a 68 y.o. female who presents for Chief Complaint  Patient presents with   Acute Visit    Blood in urine x 2 days. Urine was pink, been drinking a lot of water, when she wiped the other night, it was a small blood clot that came out.      Sherri Ramirez is a 68 year old female here with her husband.  She has hx/o multiple sclerosis who presents with back pain and hematuria.  She has been experiencing back pain that initially improved with meloxicam , which she took for about ten days. She was seen here with Dr. Vita 10/04/23 for those symptoms.   However, after discontinuing the medication, the back pain returned. The pain is exacerbated by deep breathing.  Two days ago, she noticed visible blood in her urine, which she describes as abnormal. She has not experienced hematuria in the past.   She denies any history of kidney stones or recent urinary tract infections, although she has had them in the remote past. She reports increased frequency of urination but denies fever, nausea, vomiting, or significant urinary urgency. No unusual odor in her urine has been noted.  No body aches or chills.    Her current medications include Lipitor, albuterol, vitamin D, and Rebif  for multiple sclerosis. She has allergies to carbamazepine and an unspecified multiple sclerosis medication. She has a history of anemia and seizures. She denies any history of kidney issues and has not had any recent abdominal imaging.  In terms of social history, she is not a smoker and enjoys drinking lemonade. She has been drinking more water in the past day or so.  No other aggravating or relieving factors.    No other c/o.  Past Medical History:  Diagnosis Date   Anemia    a. drop from from 12->8.6 in 11/2016.   Atrial flutter, paroxysmal (HCC)    a. dx 11/2016.   Former tobacco use    Hypokalemia    MS (multiple sclerosis) (HCC)    Pericarditis    a. dx 11/2016.   Seizure (HCC)    Volume  overload    a. in setting of atrial flutter 11/2016. Echo actually showed normal EF and normal diastolic function so was likely due to atrial flutter.   Current Outpatient Medications on File Prior to Visit  Medication Sig Dispense Refill   albuterol (VENTOLIN HFA) 108 (90 Base) MCG/ACT inhaler Proventil HFA 90 mcg/actuation aerosol inhaler  Inhale 2 puffs every 4 hours by inhalation route.     atorvastatin  (LIPITOR) 20 MG tablet Take 1 tablet (20 mg total) by mouth daily. 30 tablet 11   REBIF  REBIDOSE 44 MCG/0.5ML SOAJ INJECT INTO THE SKIN 3 TIMES WEEKLY 12 mL 1   VITAMIN D PO Take 1 tablet by mouth daily.     No current facility-administered medications on file prior to visit.     The following portions of the patient's history were reviewed and updated as appropriate: allergies, current medications, past family history, past medical history, past social history, past surgical history and problem list.  ROS Otherwise as in subjective above  Objective: BP 122/80   Pulse 97   Temp 98 F (36.7 C)   Wt 116 lb 9.6 oz (52.9 kg)   BMI 19.40 kg/m   General appearance: alert, no distress, well developed, well nourished Heart: RRR, normal S1, S2, no murmurs Lungs: CTA bilaterally, no wheezes, rhonchi, or rales Abdomen: +bs, soft,  mild generalized tenderness, otherwise non tender, non distended, no masses, no hepatomegaly, no splenomegaly Back: tender somewhat bilat CVA region, no pain with ROM Pulses: 2+ radial pulses, 2+ pedal pulses, normal cap refill Ext: no edema   Assessment: Encounter Diagnoses  Name Primary?   Gross hematuria Yes   Abdominal pain, unspecified abdominal location    Other back pain, unspecified chronicity      Plan: We discussed symptoms that concerns, possible differential.  Gross hematuria with back pain and increased urinary frequency. Urinalysis suggests possible infection.   Differential includes UTI, kidney stone, or serious conditions like  tumors or kidney inflammation. Recent meloxicam  use noted. - Order urine culture. - Check kidney function with blood tests, including creatinine. - Prescribe empirical antibiotics, Macrobid . - Consider further imaging if initial tests inconclusive or symptoms persist.  Follow-up with your PCP in 3 to 4 weeks assuming symptoms resolve to recheck the urine and recheck if any ongoing hematuria.  With gross hematuria we sometimes will pursue imaging or urology consult   Kyliah was seen today for acute visit.  Diagnoses and all orders for this visit:  Gross hematuria -     POCT Urinalysis DIP (Proadvantage Device) -     Urine Culture -     Basic metabolic panel with GFR -     CBC with Differential/Platelet  Abdominal pain, unspecified abdominal location -     Urine Culture -     Basic metabolic panel with GFR -     CBC with Differential/Platelet  Other back pain, unspecified chronicity -     Urine Culture -     Basic metabolic panel with GFR -     CBC with Differential/Platelet  Other orders -     nitrofurantoin , macrocrystal-monohydrate, (MACROBID ) 100 MG capsule; Take 1 capsule (100 mg total) by mouth 2 (two) times daily.    Follow up: pending labs

## 2023-10-19 NOTE — Patient Instructions (Addendum)
 [name] 17:20: Truly I tell you, if you have faith as small as a mustard seed, you can say to this mountain, 'Move from here to there,' and it will move. Nothing will be impossible for you    I am treating you for possible urinary tract infection, but given the gross blood in the urine, we are checking labs as well.  I hope your symptoms resolve quickly over the weekend.  We will contact you regarding results.  Assuming everything improves, follow up with your PCP Sherri Ramirez in a few weeks  If you are much worse over the weekend, fever, nausea, worse pain, chills, body aches, then go to the emergency department.

## 2023-10-19 NOTE — Progress Notes (Signed)
 Results to MyChart

## 2023-10-21 LAB — URINE CULTURE

## 2023-10-22 ENCOUNTER — Other Ambulatory Visit: Payer: Self-pay | Admitting: Medical

## 2023-10-22 DIAGNOSIS — R310 Gross hematuria: Secondary | ICD-10-CM

## 2023-10-22 MED ORDER — POTASSIUM CHLORIDE ER 10 MEQ PO TBCR
10.0000 meq | EXTENDED_RELEASE_TABLET | Freq: Every day | ORAL | 0 refills | Status: AC
Start: 2023-10-22 — End: ?

## 2023-10-22 NOTE — Progress Notes (Signed)
 Call about results.  I called twice over the weekend but they have a block on the phone not allowing *68 dial in.  Urine culture shows some bacteria, but not a whopping amount.     Has her symptoms improved?  Did all of the blood in urine stop?  If no significant improvement, we need to refer urgent to urology.  I sent in potassium as well.   She will need to follow up with her PCP within the next 2 weeks regarding anemia, low potassium.

## 2023-10-25 ENCOUNTER — Telehealth (INDEPENDENT_AMBULATORY_CARE_PROVIDER_SITE_OTHER): Payer: Self-pay | Admitting: Family Medicine

## 2023-10-25 ENCOUNTER — Telehealth: Payer: Self-pay | Admitting: Pharmacist

## 2023-10-25 DIAGNOSIS — U071 COVID-19: Secondary | ICD-10-CM

## 2023-10-25 DIAGNOSIS — R059 Cough, unspecified: Secondary | ICD-10-CM

## 2023-10-25 MED ORDER — BENZONATATE 100 MG PO CAPS: 100.0000 mg | ORAL_CAPSULE | Freq: Three times a day (TID) | ORAL | 0 refills | Status: DC | PRN

## 2023-10-25 MED ORDER — PAXLOVID (150/100) 10 X 150 MG & 10 X 100MG PO TBPK: 2.0000 | ORAL_TABLET | Freq: Two times a day (BID) | ORAL | 0 refills | Status: AC

## 2023-10-25 NOTE — Progress Notes (Signed)
 Virtual Visit via Video Note  I,Jameka J Llittleton, CMA,acting as a scribe for [employer], NP.,have documented all relevant documentation on the behalf of Sherri Creighton, NP,as directed by  Sherri Creighton, NP while in the presence of Sherri Creighton, NP.  I connected with Sherri Ramirez on 11/07/23 at  3:40 PM EDT by a video enabled telemedicine application and verified that I am speaking with the correct person using two identifiers.  Patient Location: Home Provider Location: Office/Clinic  I discussed the limitations, risks, security, and privacy concerns of performing an evaluation and management service by video and the availability of in person appointments. I also discussed with the patient that there may be a patient responsible charge related to this service. The patient expressed understanding and agreed to proceed.  Subjective: PCP: Ramirez Bruna, NP    Sherri Ramirez is a 68 year old female who presents with symptoms of COVID-19.  She has been experiencing symptoms consistent with COVID-19 for the past three days, including a persistent cough severe enough to cause chest pain, sneezing, and rhinorrhea described as 'dripping just like a faucet'. She also has a slight headache. She attempted to go to work but felt too unwell and was sent home. She performed two COVID-19 tests, both of which were positive before the full waiting period elapsed.  Her maximum recorded temperature is 99.72F. She has been taking Tylenol  and Robitussin which provide some relief, but the headache persists intermittently. She has not coughed in the last two hours but notes that coughing causes significant chest pain, particularly on the right side. She experiences slight shortness of breath and uses an inhaler as needed, which was originally prescribed during a hospital visit in 2023.  She is currently on antibiotics for a urinary tract infection, with the last dose scheduled for tomorrow. She mentions a decreased  appetite but is managing to eat, including soup, chicken, and nutritional drinks. She ensures not to take medication on an empty stomach.  She is isolating from her husband to prevent the spread of COVID-19.  Patient has a medical history of Multiple sclerosis and she is on Rebif  Rebidose  MCG SQ injections for management, she is followed by Neurology   Discussed the use of AI scribe software for clinical note transcription with the patient, who gave verbal consent to proceed.  History of Present Illness     ROS: Per HPI  Current Outpatient Medications:    benzonatate  (TESSALON  PERLES) 100 MG capsule, Take 1 capsule (100 mg total) by mouth 3 (three) times daily as needed., Disp: 30 capsule, Rfl: 0   albuterol (VENTOLIN HFA) 108 (90 Base) MCG/ACT inhaler, Proventil HFA 90 mcg/actuation aerosol inhaler  Inhale 2 puffs every 4 hours by inhalation route., Disp: , Rfl:    atorvastatin  (LIPITOR) 20 MG tablet, Take 1 tablet (20 mg total) by mouth daily., Disp: 30 tablet, Rfl: 11   nitrofurantoin , macrocrystal-monohydrate, (MACROBID ) 100 MG capsule, Take 1 capsule (100 mg total) by mouth 2 (two) times daily., Disp: 14 capsule, Rfl: 0   potassium chloride  (KLOR-CON ) 10 MEQ tablet, Take 1 tablet (10 mEq total) by mouth daily., Disp: 30 tablet, Rfl: 0   REBIF  REBIDOSE 44 MCG/0.5ML SOAJ, INJECT INTO THE SKIN 3 TIMES WEEKLY, Disp: 12 mL, Rfl: 1   VITAMIN D PO, Take 1 tablet by mouth daily., Disp: , Rfl:   Observations/Objective: Today's Vitals   Physical Exam Neurological:     Mental Status: She is alert and oriented to  person, place, and time. Mental status is at baseline.     Assessment and Plan: COVID-19 -     Paxlovid  (150/100); Take 2 tablets by mouth 2 (two) times daily for 5 days. Dosage for moderate renal impairment (eGFR >/= 30 to <60 mL/min): 150 mg nirmatrelvir (one 150 mg tablet) with 100 mg ritonavir (one 100 mg tablet), with both tablets taken together twice daily for 5 days.  Not recommended if eGFR < 30 mL/min. PAXLOVID  is not recommend in patients with severe hepatic impairment (Child-Pugh Class C).  Dispense: 20 tablet; Refill: 0  Cough in adult -     Benzonatate ; Take 1 capsule (100 mg total) by mouth 3 (three) times daily as needed.  Dispense: 30 capsule; Refill: 0    Follow Up Instructions: Return if symptoms worsen or fail to improve.   I discussed the assessment and treatment plan with the patient. The patient was provided an opportunity to ask questions, and all were answered. The patient agreed with the plan and demonstrated an understanding of the instructions.   The patient was advised to call back or seek an in-person evaluation if the symptoms worsen or if the condition fails to improve as anticipated.  The above assessment and management plan was discussed with the patient. The patient verbalized understanding of and has agreed to the management plan.   I, Sherri Creighton, NP, have reviewed all documentation for this visit. The documentation on 11/07/2023 for the exam, diagnosis, procedures, and orders are all accurate and complete.

## 2023-10-25 NOTE — Progress Notes (Signed)
   10/25/2023  Patient ID: Sherri Ramirez, female   DOB: 01-01-1956, 68 y.o.   MRN: [id number]  Patient called and left a message on my voicemail stating she tested positive for COVID and would like to have something called in.  I called her back. HIPAA identifiers were obtained.   She said she tested this morning and had been feeling weak and stuffy for a few days. To her knowledge, she had not been around anyone with COVID as she reported being at home for last few weeks other than basic grocery errands.  She was reminded to call the office or send a MyChart message to get to the clinical staff directly.  Plan: Route note to [name] and [employer].   Cassius DOROTHA Brought, PharmD, BCACP Clinical Pharmacist [phone]

## 2023-10-25 NOTE — Patient Instructions (Signed)
 COVID-19: What to Know COVID-19 is an infection caused by a virus called SARS-CoV-2. This type of virus is called a coronavirus. People with COVID-19 may: Have few to no symptoms. Have mild to moderate symptoms that affect their lungs and breathing. Get very sick. What are the causes?  COVID-19 is caused by a virus. This virus may be in the air as droplets or on surfaces. It can spread from an infected person when they cough, sneeze, speak, sing, or breathe. You may become infected if: You breathe in the infected droplets in the air. You touch an object that has the virus on it. What increases the risk? You are at risk of getting COVID-19 if you have been around someone with the infection. You may be more likely to get very sick if: You are 57 years old or older. You have certain medical conditions, such as: Heart disease. Diabetes. Long-term respiratory disease. Cancer. Pregnancy. You are immunocompromised. This means your body can't fight infections easily. You have a disability that makes it hard for you to move around, you have trouble moving, or you can't move at all. What are the signs or symptoms? People may have different symptoms from COVID-19. The symptoms can also be mild to very bad. They often show up in 5-6 days after being infected. But, they can take up to 14 days to appear. Common symptoms are: Cough. Feeling tired. New loss of taste or smell. Fever. Less common symptoms are: Sore throat. Headache. Body or muscle aches. Diarrhea. A skin rash or fingers or toes that are a different color than usual. Red or irritated eyes. Sometimes, COVID-19 does not cause symptoms. How is this diagnosed? COVID-19 can be diagnosed with tests done in the lab or at home. Fluid from your nose, mouth, or lungs will be used to check for the virus. How is this treated? Treatment for COVID-19 depends on how sick you are. Mild symptoms can be treated at home with rest, fluids, and  over-the-counter medicines. very bad symptoms may be treated in a hospital intensive care unit (ICU). If you have symptoms and are at risk of getting very sick, you may be given a medicine that fights viruses. This medicine is called an antiviral. How is this prevented? To protect yourself from COVID-19: Know your risk factors. Get vaccinated. If your body can't fight infections easily, talk to your provider about treatment to help prevent COVID-19. Stay at least about 3 feet (1 meter) away from other people. Wear mask that fits well when: You can't stay at a distance from people. You're in a place with not a lot of air flow. Try to be in open spaces with good air flow when you are in public. Wash your hands often or use an alcohol-based hand sanitizer. Cover your nose and mouth when you cough or sneeze. If you think you have COVID-19 or have been around someone who has it, stay home and away from other people as told by your provider or health officials. Where to find more information To learn more: Go to [employer]erPromos.no Click Health Topics. Type COVID-19 in the search box. Go to VisitDestination.com.br Click Health Topics. Then click All Topics. Type COVID-19 in the search box. Get help right away if: You have trouble breathing or get short of breath. You have pain or pressure in your chest. You're feeling confused. These symptoms may be an emergency. Get help right away. Call 911. Do not wait to see if the symptoms will go away.  Do not drive yourself to the hospital. This information is not intended to replace advice given to you by your health care provider. Make sure you discuss any questions you have with your health care provider. Document Revised: 10/19/2022 Document Reviewed: 10/11/2022 Elsevier Patient Education  2025 [employer].

## 2023-11-07 DIAGNOSIS — U071 COVID-19: Secondary | ICD-10-CM | POA: Insufficient documentation

## 2023-12-07 NOTE — Progress Notes (Signed)
   Cardiology Office Note    Date:  12/07/2023  ID:  Sherri Ramirez, DOB 1956/01/05, MRN [id number] PCP:  Petrina Pries, NP  Cardiologist:  None  Electrophysiologist:  None   Chief Complaint: f/u atrial flutter  History of Present Illness: .    Sherri Ramirez is a 68 y.o. female with visit-pertinent history of multiple sclerosis (followed at [facility]), remote seizures, remote tobacco abuse, isolated atrial flutter in the setting of pericarditis 2018, hypokalemia, anemia, trivial MR who presents for follow-up. Former patient of Dr Hobart.    She was remotely admitted 2018 with chest pain, SOB, chills, and tachycardia. She was found to have atrial flutter RVR, treated with IV diltiazem  + amiodarone  and converted to NSR. She also reported chest pain with inspiration and was felt to have pericarditis and HCAP. She was treated with ibuprofen  and colchicine . She was also treated with IV Lasix  for small pleural effusions. She was started on aspirin  only daily as her CHADSVASC was felt to be one for female only at that time. Dr. Maranda also felt she would be a difficult anticoagulation candidate given anemia, interaction with phenytoin , and fall risk. EF was normal. 30 day monitor in 2019 showed no afib/flutter. She went on to have coronary CTA 01/2017 with no evidence of CAD. There was aortic atherosclerosis mentioned in the overread but not in the coronary CTA itself. Last echo 03/2021 showed EF 55-60%, trivial MR.   Last seen in August 2024 and doing well.   On follow up today she feels well. Was placed on lipitor per PCP with plans to recheck labs next week. Has rare brief skipping in her heart lasting a couple of seconds. No chest pain or dyspnea.   Labwork independently reviewed: ROS: .    Please see the history of present illness. Otherwise, review of systems is positive for .  All other systems are reviewed and otherwise negative.  Studies Reviewed: SABRA    EKG Interpretation Date/Time:  Tuesday  December 11 2023 15:52:24 EST Ventricular Rate:  98 PR Interval:  158 QRS Duration:  70 QT Interval:  326 QTC Calculation: 416 R Axis:   56  Text Interpretation: Normal sinus rhythm Nonspecific T wave abnormality When compared with ECG of 01-Sep-2022 10:54, No significant change was found Confirmed by Osiris Charles [phone]) on 12/11/2023 4:05:02 PM   CV Studies: Cardiac studies reviewed are outlined and summarized above. Otherwise please see EMR for full report.  Physical Exam:    VS:  There were no vitals taken for this visit.   Wt Readings from Last 3 Encounters:  10/19/23 116 lb 9.6 oz (52.9 kg)  10/04/23 113 lb (51.3 kg)  09/27/23 115 lb 6.4 oz (52.3 kg)    GEN: Well nourished, well developed in no acute distress NECK: No JVD; No carotid bruits CARDIAC: RRR, no murmurs, rubs, gallops RESPIRATORY:  Clear to auscultation without rales, wheezing or rhonchi  ABDOMEN: Soft, non-tender, non-distended EXTREMITIES:  No edema; No acute deformity   Asessement and Plan:.    1. Paroxysmal atrial flutter - very brief occurrence in 2018 in setting of HCAP and pericarditis, converted to NSR at that time. No recurrence.   2. H/o pericarditis - quiescent without recurrence.  3. ?Aortic atherosclerosis - I personally reviewed her prior CT scans and see no evidence of atherosclerosis.  4. HLD now on statin - follow up with PCP     Disposition: follow up in one year  Signed, Dalton Molesworth, MD

## 2023-12-11 ENCOUNTER — Encounter: Payer: Self-pay | Admitting: Cardiology

## 2023-12-11 ENCOUNTER — Ambulatory Visit: Attending: Cardiology | Admitting: Cardiology

## 2023-12-11 VITALS — BP 120/78 | HR 98 | Resp 17 | Ht 65.0 in | Wt 112.0 lb

## 2023-12-11 DIAGNOSIS — I4892 Unspecified atrial flutter: Secondary | ICD-10-CM | POA: Diagnosis not present

## 2023-12-11 DIAGNOSIS — Z8679 Personal history of other diseases of the circulatory system: Secondary | ICD-10-CM | POA: Diagnosis not present

## 2023-12-11 NOTE — Patient Instructions (Signed)
 Medication Instructions:  Continue same medications *If you need a refill on your cardiac medications before your next appointment, please call your pharmacy*  Lab Work: None ordered  Testing/Procedures: None ordered  Follow-Up: At [facility], you and your health needs are our priority.  As part of our continuing mission to provide you with exceptional heart care, our providers are all part of one team.  This team includes your primary Cardiologist (physician) and Advanced Practice Providers or APPs (Physician Assistants and Nurse Practitioners) who all work together to provide you with the care you need, when you need it.  Your next appointment:  1 year    Call in July to schedule Nov appointment     Provider:  Dr.Jordan   We recommend signing up for the patient portal called MyChart.  Sign up information is provided on this After Visit Summary.  MyChart is used to connect with patients for Virtual Visits (Telemedicine).  Patients are able to view lab/test results, encounter notes, upcoming appointments, etc.  Non-urgent messages can be sent to your provider as well.   To learn more about what you can do with MyChart, go to [email or web address].

## 2023-12-13 ENCOUNTER — Encounter: Payer: Self-pay | Admitting: Family Medicine

## 2023-12-13 ENCOUNTER — Ambulatory Visit: Payer: Self-pay | Admitting: Family Medicine

## 2023-12-13 VITALS — BP 102/60 | HR 96 | Temp 98.4°F | Ht 65.0 in | Wt 111.0 lb

## 2023-12-13 DIAGNOSIS — Z1231 Encounter for screening mammogram for malignant neoplasm of breast: Secondary | ICD-10-CM

## 2023-12-13 DIAGNOSIS — R071 Chest pain on breathing: Secondary | ICD-10-CM

## 2023-12-13 DIAGNOSIS — G35D Multiple sclerosis, unspecified: Secondary | ICD-10-CM

## 2023-12-13 DIAGNOSIS — N951 Menopausal and female climacteric states: Secondary | ICD-10-CM

## 2023-12-13 DIAGNOSIS — Z23 Encounter for immunization: Secondary | ICD-10-CM | POA: Diagnosis not present

## 2023-12-13 DIAGNOSIS — E7800 Pure hypercholesterolemia, unspecified: Secondary | ICD-10-CM

## 2023-12-13 DIAGNOSIS — Z0000 Encounter for general adult medical examination without abnormal findings: Secondary | ICD-10-CM

## 2023-12-13 DIAGNOSIS — I5032 Chronic diastolic (congestive) heart failure: Secondary | ICD-10-CM | POA: Diagnosis not present

## 2023-12-13 DIAGNOSIS — Z1211 Encounter for screening for malignant neoplasm of colon: Secondary | ICD-10-CM

## 2023-12-13 DIAGNOSIS — N760 Acute vaginitis: Secondary | ICD-10-CM

## 2023-12-13 DIAGNOSIS — I4892 Unspecified atrial flutter: Secondary | ICD-10-CM | POA: Diagnosis not present

## 2023-12-13 NOTE — Patient Instructions (Signed)

## 2023-12-13 NOTE — Progress Notes (Signed)
 I,Sherri Ramirez, CMA,acting as a neurosurgeon for [employer], NP.,have documented all relevant documentation on the behalf of Sherri Creighton, NP,as directed by  Sherri Creighton, NP while in the presence of Sherri Creighton, NP.  Subjective:    Patient ID: Sherri Ramirez , female    DOB: Jun 21, 1955 , 68 y.o.   MRN: [id number]  Chief Complaint  Patient presents with   Annual Exam    Patient presents today for a physical. Patient reports compliance with her meds. Patient doesn't have any questions or concerns at this time. Patient reports she is having sob and when she breathes in it hurts and burns really bad. Her symptoms started a few days ago. She reports the pain happens periodically.     HPI Discussed the use of AI scribe software for clinical note transcription with the patient, who gave verbal consent to proceed.  History of Present Illness    Sherri Ramirez is a 68 year old female with pericarditis who presents for an annual physical exam and evaluation of chest pain.  She experiences chest pain aggravated by breathing, described as a burning sensation in the middle of the right side of her back and the center of her chest. This pain has been intermittent but intensified significantly yesterday, causing distress. The pain is not associated with food intake and is not accompanied by coughing or shortness of breath. She has been prescribed pain medication, including meloxicam , but the pain persists. She reports that her cardiologist told her she was 'good' and did not mention any problems or blockages after her recent EKG, and she is currently on cholesterol medication.  She reports experiencing vaginal burning for the past couple of days, which is not associated with urination. She recalls a similar issue years ago, treated with antibiotics for a urinary tract infection. She consumes about three to four glasses of water daily.  She is on biweekly injections for multiple sclerosis and follows up with  [facility] annually. She administers the injections herself at home.      Past Medical History:  Diagnosis Date   Anemia    a. drop from from 12->8.6 in 11/2016.   Atrial flutter, paroxysmal (HCC)    a. dx 11/2016.   Former tobacco use    Hypokalemia    MS (multiple sclerosis)    Pericarditis    a. dx 11/2016.   Seizure (HCC)    Volume overload    a. in setting of atrial flutter 11/2016. Echo actually showed normal EF and normal diastolic function so was likely due to atrial flutter.     Family History  Problem Relation Age of Onset   Colon cancer Mother    Heart attack Father      Current Outpatient Medications:    albuterol (VENTOLIN HFA) 108 (90 Base) MCG/ACT inhaler, Proventil HFA 90 mcg/actuation aerosol inhaler  Inhale 2 puffs every 4 hours by inhalation route., Disp: , Rfl:    miconazole  (MICOTIN) 200 MG vaginal suppository, Place 1 suppository (200 mg total) vaginally at bedtime., Disp: 3 suppository, Rfl: 0   potassium chloride  (KLOR-CON ) 10 MEQ tablet, Take 1 tablet (10 mEq total) by mouth daily., Disp: 30 tablet, Rfl: 0   VITAMIN D PO, Take 1 tablet by mouth daily., Disp: , Rfl:    atorvastatin  (LIPITOR) 20 MG tablet, Take 1 tablet by mouth once daily, Disp: 90 tablet, Rfl: 2   nitrofurantoin , macrocrystal-monohydrate, (MACROBID ) 100 MG capsule, Take 1 capsule (100 mg total) by mouth  2 (two) times daily. (Patient not taking: Reported on 12/13/2023), Disp: 14 capsule, Rfl: 0   REBIF  REBIDOSE 44 MCG/0.5ML SOAJ, INJECT 44 MCG INTO THE SKIN 3 TIMES WEEKLY, Disp: 24 mL, Rfl: 0   Allergies  Allergen Reactions   Carbamazepine    Glatiramer Acetate     REACTION: anaphylaxis      :  Social History   Tobacco Use  Smoking Status Former   Passive exposure: Never  Smokeless Tobacco Never   Social History   Substance and Sexual Activity  Alcohol Use No      Review of Systems  Constitutional: Negative.   HENT: Negative.    Eyes: Negative.    Respiratory: Negative.    Cardiovascular: Negative.   Gastrointestinal: Negative.   Endocrine: Negative.   Genitourinary: Negative.   Musculoskeletal: Negative.   Skin: Negative.   Allergic/Immunologic: Negative.   Neurological: Negative.   Hematological: Negative.   Psychiatric/Behavioral: Negative.       Today's Vitals   12/13/23 1413  BP: 102/60  Pulse: 96  Temp: 98.4 F (36.9 C)  TempSrc: Oral  SpO2: 99%  Weight: 111 lb (50.3 kg)  Height: 5' 5 (1.651 m)  PainSc: 5   PainLoc: Back   Body mass index is 18.47 kg/m.  Wt Readings from Last 3 Encounters:  12/13/23 111 lb (50.3 kg)  12/11/23 112 lb (50.8 kg)  10/19/23 116 lb 9.6 oz (52.9 kg)     Objective:  Physical Exam Constitutional:      Appearance: Normal appearance.  HENT:     Head: Normocephalic.  Cardiovascular:     Rate and Rhythm: Normal rate and regular rhythm.     Pulses: Normal pulses.     Heart sounds: Normal heart sounds.  Pulmonary:     Effort: Pulmonary effort is normal.     Breath sounds: Normal breath sounds.  Abdominal:     General: Bowel sounds are normal.  Genitourinary:    Comments: Did not examine Musculoskeletal:        General: Tenderness present. Normal range of motion.  Skin:    General: Skin is warm and dry.  Neurological:     General: No focal deficit present.     Mental Status: She is alert and oriented to person, place, and time. Mental status is at baseline.  Psychiatric:        Mood and Affect: Mood normal.         Assessment And Plan:     Encounter for general adult medical examination w/o abnormal findings  Multiple sclerosis Assessment & Plan: Follow-up with [facility] annually. - Continue biweekly Rebif  injections at home.   Orders: -     CBC -     CMP14+EGFR  Paroxysmal atrial flutter (HCC) Assessment & Plan: No A-fib was noted at the cardiolgy office yesterday, she denies any dizziness/lightheadedness   Pain aggravated by  breathing Assessment & Plan: Intermittent chest pain aggravated by breathing, located in the mid-back and center of the chest. No associated cough or shortness of breath. Previous pericarditis noted. Pain not related to food intake. Recent COVID infection noted. - Ordered chest x-ray with front, back, and side views  Orders: -     DG Chest 2 View; Future  Screening for colon cancer Assessment & Plan: - Schedule colonoscopy with Dr. Kristie.   Menopausal syndrome -     DG Bone Density; Future  Acute vaginitis -     Miconazole  Nitrate; Place 1 suppository (200 mg  total) vaginally at bedtime.  Dispense: 3 suppository; Refill: 0  Hypercholesteremia -     Lipid panel  Screening mammogram for breast cancer -     3D Screening Mammogram, Left and Right; Future  Need for influenza vaccination Assessment & Plan: Administered influenza vaccine.  Orders: -     Flu vaccine HIGH DOSE PF(Fluzone Trivalent)  Chronic diastolic congestive heart failure (HCC) Assessment & Plan: EF 50-55 % in 2023, no shortness of breath or edema noted. Seen by cardiologist yesterday. -continue current treatment.       Patient was given opportunity to ask questions. Patient verbalized understanding of the plan and was able to repeat key elements of the plan. All questions were answered to their satisfaction.   I, Sherri Creighton, NP, have reviewed all documentation for this visit. The documentation on 12/24/2023 for the exam, diagnosis, procedures, and orders are all accurate and complete.

## 2023-12-14 ENCOUNTER — Other Ambulatory Visit: Payer: Self-pay | Admitting: Neurology

## 2023-12-14 ENCOUNTER — Other Ambulatory Visit

## 2023-12-14 ENCOUNTER — Other Ambulatory Visit: Payer: Self-pay | Admitting: Family Medicine

## 2023-12-14 DIAGNOSIS — G35D Multiple sclerosis, unspecified: Secondary | ICD-10-CM

## 2023-12-14 LAB — CMP14+EGFR
ALT: 18 IU/L (ref 0–32)
AST: 23 IU/L (ref 0–40)
Albumin: 4 g/dL (ref 3.9–4.9)
Alkaline Phosphatase: 63 IU/L (ref 49–135)
BUN/Creatinine Ratio: 24 (ref 12–28)
BUN: 18 mg/dL (ref 8–27)
Bilirubin Total: 0.5 mg/dL (ref 0.0–1.2)
CO2: 23 mmol/L (ref 20–29)
Calcium: 9.3 mg/dL (ref 8.7–10.3)
Chloride: 103 mmol/L (ref 96–106)
Creatinine, Ser: 0.76 mg/dL (ref 0.57–1.00)
Globulin, Total: 3.9 g/dL (ref 1.5–4.5)
Glucose: 84 mg/dL (ref 70–99)
Potassium: 4.3 mmol/L (ref 3.5–5.2)
Sodium: 140 mmol/L (ref 134–144)
Total Protein: 7.9 g/dL (ref 6.0–8.5)
eGFR: 86 mL/min/1.73 (ref >59)

## 2023-12-14 LAB — LIPID PANEL
Chol/HDL Ratio: 2.8 ratio (ref 0.0–4.4)
Cholesterol, Total: 144 mg/dL (ref 100–199)
HDL: 52 mg/dL (ref >39)
LDL Chol Calc (NIH): 80 mg/dL (ref 0–99)
Triglycerides: 60 mg/dL (ref 0–149)
VLDL Cholesterol Cal: 12 mg/dL (ref 5–40)

## 2023-12-14 LAB — CBC
Hematocrit: 36.3 % (ref 34.0–46.6)
Hemoglobin: 11 g/dL — ABNORMAL LOW (ref 11.1–15.9)
MCH: 24.6 pg — ABNORMAL LOW (ref 26.6–33.0)
MCHC: 30.3 g/dL — ABNORMAL LOW (ref 31.5–35.7)
MCV: 81 fL (ref 79–97)
Platelets: 237 x10E3/uL (ref 150–450)
RBC: 4.48 x10E6/uL (ref 3.77–5.28)
RDW: 14.4 % (ref 11.7–15.4)
WBC: 5.1 x10E3/uL (ref 3.4–10.8)

## 2023-12-15 ENCOUNTER — Other Ambulatory Visit: Payer: Self-pay | Admitting: Family Medicine

## 2023-12-15 DIAGNOSIS — M545 Low back pain, unspecified: Secondary | ICD-10-CM

## 2023-12-17 ENCOUNTER — Ambulatory Visit
Admission: RE | Admit: 2023-12-17 | Discharge: 2023-12-17 | Disposition: A | Discharge status: Home / Self Care | Source: Ambulatory Visit | Attending: Family Medicine | Admitting: Family Medicine

## 2023-12-17 DIAGNOSIS — R071 Chest pain on breathing: Secondary | ICD-10-CM

## 2023-12-17 NOTE — Telephone Encounter (Signed)
 Last seen on 09/05/23 Follow up scheduled on 09/04/24

## 2023-12-24 ENCOUNTER — Ambulatory Visit: Payer: Self-pay | Admitting: Family Medicine

## 2023-12-24 DIAGNOSIS — Z23 Encounter for immunization: Secondary | ICD-10-CM | POA: Insufficient documentation

## 2023-12-24 DIAGNOSIS — Z1211 Encounter for screening for malignant neoplasm of colon: Secondary | ICD-10-CM | POA: Insufficient documentation

## 2023-12-24 DIAGNOSIS — Z1231 Encounter for screening mammogram for malignant neoplasm of breast: Secondary | ICD-10-CM | POA: Insufficient documentation

## 2023-12-24 DIAGNOSIS — E7800 Pure hypercholesterolemia, unspecified: Secondary | ICD-10-CM | POA: Insufficient documentation

## 2023-12-24 DIAGNOSIS — I4892 Unspecified atrial flutter: Secondary | ICD-10-CM | POA: Insufficient documentation

## 2023-12-24 DIAGNOSIS — N760 Acute vaginitis: Secondary | ICD-10-CM | POA: Insufficient documentation

## 2023-12-24 DIAGNOSIS — Z0000 Encounter for general adult medical examination without abnormal findings: Secondary | ICD-10-CM | POA: Insufficient documentation

## 2023-12-24 MED ORDER — MICONAZOLE NITRATE 200 MG VA SUPP: 200.0000 mg | Freq: Every day | VAGINAL | 0 refills | Status: AC

## 2023-12-24 NOTE — Assessment & Plan Note (Addendum)
 Follow-up with [facility] annually. - Continue biweekly Rebif  injections at home.

## 2023-12-24 NOTE — Assessment & Plan Note (Signed)
 Intermittent chest pain aggravated by breathing, located in the mid-back and center of the chest. No associated cough or shortness of breath. Previous pericarditis noted. Pain not related to food intake. Recent COVID infection noted. - Ordered chest x-ray with front, back, and side views

## 2023-12-24 NOTE — Assessment & Plan Note (Signed)
Administered influenza vaccine

## 2023-12-24 NOTE — Assessment & Plan Note (Signed)
 No A-fib was noted at the cardiolgy office yesterday, she denies any dizziness/lightheadedness

## 2023-12-24 NOTE — Assessment & Plan Note (Signed)
-   Schedule colonoscopy with Dr. Kristie.

## 2023-12-24 NOTE — Assessment & Plan Note (Signed)
 EF 50-55 % in 2023, no shortness of breath or edema noted. Seen by cardiologist yesterday. -continue current treatment.

## 2023-12-24 NOTE — Progress Notes (Signed)
 Chest x-ray is normal. Nothing acute noted. Your cholesterol levels have improved, your blood count though  slightly low has improved greatly. Continue eating foods high in iron and take your meds as prescribed Chest x-ray is normal. Nothing acute noted. Your cholesterol levels have improved, your blood count though  slightly low has improved greatly. Continue eating foods high in iron and take your meds as prescribed  Thank you!

## 2023-12-25 ENCOUNTER — Telehealth: Payer: Self-pay | Admitting: Neurology

## 2023-12-25 ENCOUNTER — Other Ambulatory Visit: Payer: Self-pay | Admitting: Neurology

## 2023-12-25 MED ORDER — GABAPENTIN 300 MG PO CAPS: 300.0000 mg | ORAL_CAPSULE | Freq: Three times a day (TID) | ORAL | 11 refills | Status: AC

## 2023-12-25 NOTE — Addendum Note (Signed)
 Addended by: JOSHUA MAURILIO CROME on: 12/25/2023 10:43 AM   Modules accepted: Orders

## 2023-12-25 NOTE — Telephone Encounter (Addendum)
 Pt saw SS,NP 09/05/23. Next f/u: 09/04/24.  Called pt at [phone].  Saw Cardiology/PCP and everything stable. She feels strange. Feels like someone is squeezing her, MS hug? Read online about this. Feels this is what is occurring.  Sx started about a week or so ago. Sx intermittent daily. Had UTI end of September.  Treated w/ antibiotics and resolved. Was supposed to see urology but never heard to schedule appt. Denies any other illness/infection.  MS DMT:  Rebif  INJECT 44 MCG INTO THE SKIN 3 TIMES WEEKLY. Has only missed one dose (cannot remember when). No other new meds started recently.  No new numbness/tingling. Feels more fatigued. No vision changes. No new weakness/ambulation issues. Taking meloxicam  15 mg prn for back pain prn. Took OTC vaginal suppository for burning pain/vagistat last week. Has not completely resolved. Asked she contact PCP about doing repeat UA to make sure she does not have reoccurring UTI.   I also asked she no longer send personal emails to Lauraine d/t not protecting health info and to ensure we get her message. Asked she call or either utilize mychart with [facility]. She verbalized understanding.   Aware I will send to Dr. Vear since SS,NP out.   Per last note:

## 2023-12-25 NOTE — Telephone Encounter (Signed)
 Patient sent me a personal email to my cone email address asking for her to be contacted about MS concern. Please reach out to her. Thanks

## 2023-12-25 NOTE — Telephone Encounter (Signed)
 Called and relayed information to pt and she voiced gratitude and understanding

## 2023-12-26 ENCOUNTER — Telehealth: Payer: Self-pay | Admitting: Neurology

## 2023-12-26 NOTE — Telephone Encounter (Signed)
 Pt called stating that  MD had increased Pt medication dosage gabapentin  (NEURONTIN ) 300 MG capsule  ,Pt is requesting to decrease the medication  due to Pt stated it knocker her out all day   Pt would like to discuss with Md about decrease

## 2023-12-31 ENCOUNTER — Encounter: Payer: Self-pay | Admitting: Family Medicine

## 2024-01-23 ENCOUNTER — Other Ambulatory Visit: Payer: Self-pay

## 2024-01-23 ENCOUNTER — Ambulatory Visit: Payer: Medicare Other

## 2024-01-23 DIAGNOSIS — Z0000 Encounter for general adult medical examination without abnormal findings: Secondary | ICD-10-CM

## 2024-01-23 MED ORDER — ALBUTEROL SULFATE HFA 108 (90 BASE) MCG/ACT IN AERS: INHALATION_SPRAY | RESPIRATORY_TRACT | 1 refills | Status: DC

## 2024-01-23 NOTE — Progress Notes (Signed)
 "  Chief Complaint  Patient presents with   Medicare Wellness     Subjective:   Sherri Ramirez is a 68 y.o. female who presents for a Medicare Annual Wellness Visit.  Visit info / Clinical Intake: Medicare Wellness Visit Type:: Subsequent Annual Wellness Visit Persons participating in visit and providing information:: patient Medicare Wellness Visit Mode:: Telephone If telephone:: video declined Since this visit was completed virtually, some vitals may be partially provided or unavailable. Missing vitals are due to the limitations of the virtual format.: Unable to obtain vitals - no equipment If Telephone or Video please confirm:: I connected with patient using audio/video enable telemedicine. I verified patient identity with two identifiers, discussed telehealth limitations, and patient agreed to proceed. Patient Location:: home Provider Location:: office Interpreter Needed?: No Pre-visit prep was completed: yes AWV questionnaire completed by patient prior to visit?: no Living arrangements:: lives with spouse/significant other Patient's Overall Health Status Rating: very good Typical amount of pain: some Does pain affect daily life?: no Are you currently prescribed opioids?: no  Dietary Habits and Nutritional Risks How many meals a day?: 2 Eats fruit and vegetables daily?: yes Most meals are obtained by: preparing own meals; eating out In the last 2 weeks, have you had any of the following?: none Diabetic:: no  Functional Status Activities of Daily Living (to include ambulation/medication): Independent Ambulation: Independent Medication Administration: Independent Home Management (perform basic housework or laundry): Independent Manage your own finances?: yes Primary transportation is: driving Concerns about vision?: no *vision screening is required for WTM* Concerns about hearing?: no  Fall Screening Falls in the past year?: 0 Number of falls in past year: 0 Was there  an injury with Fall?: 0 Fall Risk Category Calculator: 0 Patient Fall Risk Level: Low Fall Risk  Fall Risk Patient at Risk for Falls Due to: Medication side effect Fall risk Follow up: Falls prevention discussed; Falls evaluation completed  Home and Transportation Safety: All rugs have non-skid backing?: yes All stairs or steps have railings?: N/A, no stairs Grab bars in the bathtub or shower?: yes Have non-skid surface in bathtub or shower?: yes Good home lighting?: yes Regular seat belt use?: yes Hospital stays in the last year:: no  Cognitive Assessment Difficulty concentrating, remembering, or making decisions? : no Will 6CIT or Mini Cog be Completed: yes What year is it?: 0 points What month is it?: 0 points Give patient an address phrase to remember (5 components): [address] About what time is it?: 0 points Count backwards from 20 to 1: 0 points Say the months of the year in reverse: 0 points Repeat the address phrase from earlier: 0 points 6 CIT Score: 0 points  Advance Directives (For Healthcare) Does Patient Have a Medical Advance Directive?: No Would patient like information on creating a medical advance directive?: No - Patient declined  Reviewed/Updated  Reviewed/Updated: Reviewed All (Medical, Surgical, Family, Medications, Allergies, Care Teams, Patient Goals)    Allergies (verified) Carbamazepine and Glatiramer acetate   Current Medications (verified) Outpatient Encounter Medications as of 01/23/2024  Medication Sig   albuterol  (VENTOLIN  HFA) 108 (90 Base) MCG/ACT inhaler Proventil  HFA 90 mcg/actuation aerosol inhaler  Inhale 2 puffs every 4 hours by inhalation route.   atorvastatin  (LIPITOR) 20 MG tablet Take 1 tablet by mouth once daily   Multiple Vitamin (MULTIVITAMIN) tablet Take 1 tablet by mouth daily.   REBIF  REBIDOSE 44 MCG/0.5ML SOAJ INJECT 44 MCG INTO THE SKIN 3 TIMES WEEKLY   VITAMIN  D PO Take 1 tablet by mouth daily.   gabapentin   (NEURONTIN ) 300 MG capsule Take 1 capsule (300 mg total) by mouth 3 (three) times daily. (Patient not taking: Reported on 01/23/2024)   meloxicam  (MOBIC ) 15 MG tablet Take 15 mg by mouth daily. (Patient not taking: Reported on 01/23/2024)   miconazole  (MICOTIN) 200 MG vaginal suppository Place 1 suppository (200 mg total) vaginally at bedtime.   potassium chloride  (KLOR-CON ) 10 MEQ tablet Take 1 tablet (10 mEq total) by mouth daily. (Patient not taking: Reported on 01/23/2024)   No facility-administered encounter medications on file as of 01/23/2024.    History: Past Medical History:  Diagnosis Date   Anemia    a. drop from from 12->8.6 in 11/2016.   Atrial flutter, paroxysmal (HCC)    a. dx 11/2016.   Former tobacco use    Hypokalemia    MS (multiple sclerosis)    Pericarditis    a. dx 11/2016.   Seizure (HCC)    Volume overload    a. in setting of atrial flutter 11/2016. Echo actually showed normal EF and normal diastolic function so was likely due to atrial flutter.   Past Surgical History:  Procedure Laterality Date   ABDOMINAL HYSTERECTOMY     BREAST EXCISIONAL BIOPSY Left pt unsure   benign   HYSTEROTOMY     Family History  Problem Relation Age of Onset   Colon cancer Mother    Heart attack Father    Social History   Occupational History   Not on file  Tobacco Use   Smoking status: Former    Passive exposure: Never   Smokeless tobacco: Never  Vaping Use   Vaping status: Never Used  Substance and Sexual Activity   Alcohol use: No   Drug use: No   Sexual activity: Not on file   Tobacco Counseling Counseling given: Not Answered  SDOH Screenings   Food Insecurity: No Food Insecurity (01/23/2024)  Housing: Unknown (01/23/2024)  Transportation Needs: No Transportation Needs (01/23/2024)  Utilities: Not At Risk (01/23/2024)  Alcohol Screen: Low Risk (01/23/2024)  Depression (PHQ2-9): Low Risk (01/23/2024)  Financial Resource Strain: Low Risk (01/23/2024)   Physical Activity: Inactive (01/23/2024)  Social Connections: Moderately Integrated (01/23/2024)  Stress: No Stress Concern Present (01/23/2024)  Tobacco Use: Medium Risk (01/23/2024)  Health Literacy: Adequate Health Literacy (01/23/2024)   See flowsheets for full screening details  Depression Screen PHQ 2 & 9 Depression Scale- Over the past 2 weeks, how often have you been bothered by any of the following problems? Little interest or pleasure in doing things: 0 Feeling down, depressed, or hopeless (PHQ Adolescent also includes...irritable): 0 PHQ-2 Total Score: 0 Trouble falling or staying asleep, or sleeping too much: 0 Feeling tired or having little energy: 0 Poor appetite or overeating (PHQ Adolescent also includes...weight loss): 0 Feeling bad about yourself - or that you are a failure or have let yourself or your family down: 0 Trouble concentrating on things, such as reading the newspaper or watching television (PHQ Adolescent also includes...like school work): 0 Moving or speaking so slowly that other people could have noticed. Or the opposite - being so fidgety or restless that you have been moving around a lot more than usual: 0 Thoughts that you would be better off dead, or of hurting yourself in some way: 0 PHQ-9 Total Score: 0 If you checked off any problems, how difficult have these problems made it for you to do your work, take care of things at  home, or get along with other people?: Not difficult at all     Goals Addressed             This Visit's Progress    Patient Stated       01/23/2024, stay alive             Objective:    Today's Vitals   There is no height or weight on file to calculate BMI.  Hearing/Vision screen Hearing Screening - Comments:: Denies hearing issues Vision Screening - Comments:: Regular eye exams Immunizations and Health Maintenance Health Maintenance  Topic Date Due   Pneumococcal Vaccine: 50+ Years (1 of 2 - PCV) Never  done   Zoster Vaccines- Shingrix (1 of 2) Never done   Bone Density Scan  Never done   Mammogram  03/28/2024   COVID-19 Vaccine (5 - Moderna risk 2025-26 season) 06/24/2024   Medicare Annual Wellness (AWV)  01/22/2025   Colonoscopy  10/01/2028   DTaP/Tdap/Td (2 - Td or Tdap) 05/31/2031   Influenza Vaccine  Completed   Hepatitis C Screening  Completed   Meningococcal B Vaccine  Aged Out        Assessment/Plan:  This is a routine wellness examination for [name].  Patient Care Team: Petrina Pries, NP as PCP - General (Family Medicine) Gayland Lauraine PARAS, NP as Nurse Practitioner (Neurology) Jordan, Peter M, MD as Consulting Physician (Cardiology)  I have personally reviewed and noted the following in the patients chart:   Medical and social history Use of alcohol, tobacco or illicit drugs  Current medications and supplements including opioid prescriptions. Functional ability and status Nutritional status Physical activity Advanced directives List of other physicians Hospitalizations, surgeries, and ER visits in previous 12 months Vitals Screenings to include cognitive, depression, and falls Referrals and appointments  No orders of the defined types were placed in this encounter.  In addition, I have reviewed and discussed with patient certain preventive protocols, quality metrics, and best practice recommendations. A written personalized care plan for preventive services as well as general preventive health recommendations were provided to patient.   Ardella FORBES Dawn, LPN   [id number]   Return in 1 year (on 01/22/2025).  After Visit Summary: (MyChart) Due to this being a telephonic visit, the after visit summary with patients personalized plan was offered to patient via MyChart   Nurse Notes: HM Addressed: Vaccines Due: pneumonia and shingles DEXA and mammogram were ordered on 12/13/2023  "

## 2024-01-23 NOTE — Patient Instructions (Signed)
 Sherri Ramirez,  Thank you for taking the time for your Medicare Wellness Visit. I appreciate your continued commitment to your health goals. Please review the care plan we discussed, and feel free to reach out if I can assist you further.  Please note that Annual Wellness Visits do not include a physical exam. Some assessments may be limited, especially if the visit was conducted virtually. If needed, we may recommend an in-person follow-up with your provider.  Ongoing Care Seeing your primary care provider every 3 to 6 months helps us  monitor your health and provide consistent, personalized care.   Referrals If a referral was made during today's visit and you haven't received any updates within two weeks, please contact the referred provider directly to check on the status.  Recommended Screenings:  Health Maintenance  Topic Date Due   Pneumococcal Vaccine for age over 53 (1 of 2 - PCV) Never done   Zoster (Shingles) Vaccine (1 of 2) Never done   Osteoporosis screening with Bone Density Scan  Never done   Medicare Annual Wellness Visit  01/10/2024   Breast Cancer Screening  03/28/2024   COVID-19 Vaccine (5 - Moderna risk 2025-26 season) 06/24/2024   Colon Cancer Screening  10/01/2028   DTaP/Tdap/Td vaccine (2 - Td or Tdap) 05/31/2031   Flu Shot  Completed   Hepatitis C Screening  Completed   Meningitis B Vaccine  Aged Out       01/23/2024   10:25 AM  Advanced Directives  Does Patient Have a Medical Advance Directive? No  Would patient like information on creating a medical advance directive? No - Patient declined    Vision: Annual vision screenings are recommended for early detection of glaucoma, cataracts, and diabetic retinopathy. These exams can also reveal signs of chronic conditions such as diabetes and high blood pressure.  Dental: Annual dental screenings help detect early signs of oral cancer, gum disease, and other conditions linked to overall health, including heart  disease and diabetes.  Please see the attached documents for additional preventive care recommendations.

## 2024-02-11 ENCOUNTER — Other Ambulatory Visit: Payer: Self-pay

## 2024-02-11 MED ORDER — ALBUTEROL SULFATE HFA 108 (90 BASE) MCG/ACT IN AERS: INHALATION_SPRAY | RESPIRATORY_TRACT | 1 refills | Status: AC

## 2024-02-20 ENCOUNTER — Telehealth: Payer: Self-pay | Admitting: Neurology

## 2024-02-20 NOTE — Telephone Encounter (Signed)
 MYC cxl

## 2024-06-11 ENCOUNTER — Ambulatory Visit: Admitting: Family Medicine

## 2024-09-04 ENCOUNTER — Ambulatory Visit: Admitting: Neurology

## 2024-12-18 ENCOUNTER — Encounter: Payer: Self-pay | Admitting: Family Medicine

## 2025-01-13 ENCOUNTER — Ambulatory Visit: Payer: Self-pay
